# Patient Record
Sex: Female | Born: 1980
Health system: Southern US, Community
[De-identification: ages and names within clinical notes are randomized; demographics above are authoritative.]

## PROBLEM LIST (undated history)

## (undated) ENCOUNTER — Emergency Department (HOSPITAL_COMMUNITY): Admission: EM | Payer: Medicare Other | Source: Home / Self Care

## (undated) DIAGNOSIS — Z87898 Personal history of other specified conditions: Secondary | ICD-10-CM

## (undated) DIAGNOSIS — F1911 Other psychoactive substance abuse, in remission: Secondary | ICD-10-CM

## (undated) DIAGNOSIS — R739 Hyperglycemia, unspecified: Secondary | ICD-10-CM

## (undated) DIAGNOSIS — Z8639 Personal history of other endocrine, nutritional and metabolic disease: Secondary | ICD-10-CM

## (undated) DIAGNOSIS — J449 Chronic obstructive pulmonary disease, unspecified: Secondary | ICD-10-CM

## (undated) DIAGNOSIS — R569 Unspecified convulsions: Secondary | ICD-10-CM

## (undated) DIAGNOSIS — F22 Delusional disorders: Secondary | ICD-10-CM

## (undated) DIAGNOSIS — M65311 Trigger thumb, right thumb: Secondary | ICD-10-CM

## (undated) DIAGNOSIS — F17200 Nicotine dependence, unspecified, uncomplicated: Secondary | ICD-10-CM

## (undated) DIAGNOSIS — G43909 Migraine, unspecified, not intractable, without status migrainosus: Secondary | ICD-10-CM

## (undated) DIAGNOSIS — G629 Polyneuropathy, unspecified: Secondary | ICD-10-CM

## (undated) DIAGNOSIS — G500 Trigeminal neuralgia: Secondary | ICD-10-CM

## (undated) DIAGNOSIS — B20 Human immunodeficiency virus [HIV] disease: Secondary | ICD-10-CM

## (undated) DIAGNOSIS — E0590 Thyrotoxicosis, unspecified without thyrotoxic crisis or storm: Secondary | ICD-10-CM

## (undated) DIAGNOSIS — A310 Pulmonary mycobacterial infection: Secondary | ICD-10-CM

## (undated) DIAGNOSIS — F329 Major depressive disorder, single episode, unspecified: Secondary | ICD-10-CM

## (undated) DIAGNOSIS — F419 Anxiety disorder, unspecified: Secondary | ICD-10-CM

## (undated) HISTORY — PX: CHOLECYSTECTOMY: SHX55

## (undated) HISTORY — PX: FRACTURE SURGERY: SHX138

## (undated) HISTORY — DX: Delusional disorders: F22

## (undated) HISTORY — PX: CARPAL TUNNEL RELEASE: SHX101

## (undated) HISTORY — PX: URETERAL STENT PLACEMENT: SHX822

## (undated) HISTORY — PX: WRIST SURGERY: SHX841

## (undated) HISTORY — PX: CLOSED MANIPULATION SHOULDER: SUR205

## (undated) HISTORY — DX: Thyrotoxicosis, unspecified without thyrotoxic crisis or storm: E05.90

## (undated) HISTORY — DX: Human immunodeficiency virus (HIV) disease: B20

## (undated) HISTORY — DX: Major depressive disorder, single episode, unspecified: F32.9

## (undated) HISTORY — PX: WISDOM TOOTH EXTRACTION: SHX21

## (undated) HISTORY — PX: CYSTOSCOPY W/ URETERAL STENT REMOVAL: SHX1430

## (undated) HISTORY — PX: HAND SURGERY: SHX662

## (undated) HISTORY — DX: Anxiety disorder, unspecified: F41.9

## (undated) HISTORY — DX: Unspecified convulsions: R56.9

## (undated) HISTORY — PX: URETER SURGERY: SHX823

## (undated) HISTORY — DX: Hyperglycemia, unspecified: R73.9

---
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
## 1999-02-22 ENCOUNTER — Other Ambulatory Visit: Admission: RE | Admit: 1999-02-22 | Discharge: 1999-02-22 | Payer: Self-pay | Admitting: Obstetrics & Gynecology

## 1999-05-31 DIAGNOSIS — F32A Depression, unspecified: Secondary | ICD-10-CM

## 1999-05-31 HISTORY — DX: Depression, unspecified: F32.A

## 1999-09-11 ENCOUNTER — Inpatient Hospital Stay (HOSPITAL_COMMUNITY): Admission: AD | Admit: 1999-09-11 | Discharge: 1999-09-13 | Payer: Self-pay | Admitting: Family Medicine

## 1999-10-14 ENCOUNTER — Encounter: Admission: RE | Admit: 1999-10-14 | Discharge: 1999-10-14 | Payer: Self-pay | Admitting: Orthopedic Surgery

## 1999-10-14 ENCOUNTER — Encounter: Payer: Self-pay | Admitting: Orthopedic Surgery

## 2001-04-06 ENCOUNTER — Emergency Department (HOSPITAL_COMMUNITY): Admission: EM | Admit: 2001-04-06 | Discharge: 2001-04-06 | Payer: Self-pay | Admitting: Emergency Medicine

## 2001-04-06 ENCOUNTER — Encounter: Payer: Self-pay | Admitting: Internal Medicine

## 2001-04-06 ENCOUNTER — Encounter: Payer: Self-pay | Admitting: Emergency Medicine

## 2001-05-30 HISTORY — PX: WRIST SURGERY: SHX841

## 2001-05-30 HISTORY — PX: HAND SURGERY: SHX662

## 2001-06-23 ENCOUNTER — Emergency Department (HOSPITAL_COMMUNITY): Admission: EM | Admit: 2001-06-23 | Discharge: 2001-06-23 | Payer: Self-pay | Admitting: Emergency Medicine

## 2001-07-06 ENCOUNTER — Encounter: Payer: Self-pay | Admitting: Orthopedic Surgery

## 2001-07-06 ENCOUNTER — Ambulatory Visit (HOSPITAL_COMMUNITY): Admission: RE | Admit: 2001-07-06 | Discharge: 2001-07-06 | Payer: Self-pay | Admitting: Orthopedic Surgery

## 2001-08-17 ENCOUNTER — Other Ambulatory Visit: Admission: RE | Admit: 2001-08-17 | Discharge: 2001-08-17 | Payer: Self-pay | Admitting: *Deleted

## 2001-08-21 ENCOUNTER — Encounter: Payer: Self-pay | Admitting: *Deleted

## 2001-08-21 ENCOUNTER — Encounter: Admission: RE | Admit: 2001-08-21 | Discharge: 2001-08-21 | Payer: Self-pay | Admitting: *Deleted

## 2001-11-22 ENCOUNTER — Encounter: Admission: RE | Admit: 2001-11-22 | Discharge: 2002-02-20 | Payer: Self-pay | Admitting: Orthopedic Surgery

## 2002-01-01 ENCOUNTER — Ambulatory Visit (HOSPITAL_BASED_OUTPATIENT_CLINIC_OR_DEPARTMENT_OTHER): Admission: RE | Admit: 2002-01-01 | Discharge: 2002-01-02 | Payer: Self-pay | Admitting: Orthopedic Surgery

## 2002-01-01 HISTORY — PX: SHOULDER ARTHROSCOPY: SHX128

## 2002-01-04 ENCOUNTER — Encounter: Payer: Self-pay | Admitting: Orthopedic Surgery

## 2002-01-04 ENCOUNTER — Inpatient Hospital Stay (HOSPITAL_COMMUNITY): Admission: EM | Admit: 2002-01-04 | Discharge: 2002-01-08 | Payer: Self-pay | Admitting: *Deleted

## 2002-02-21 ENCOUNTER — Encounter: Admission: RE | Admit: 2002-02-21 | Discharge: 2002-03-07 | Payer: Self-pay | Admitting: Orthopedic Surgery

## 2002-03-08 ENCOUNTER — Encounter: Admission: RE | Admit: 2002-03-08 | Discharge: 2002-05-01 | Payer: Self-pay | Admitting: Orthopedic Surgery

## 2002-06-09 ENCOUNTER — Encounter: Payer: Self-pay | Admitting: Emergency Medicine

## 2002-06-09 ENCOUNTER — Emergency Department (HOSPITAL_COMMUNITY): Admission: EM | Admit: 2002-06-09 | Discharge: 2002-06-09 | Payer: Self-pay | Admitting: Emergency Medicine

## 2003-05-31 DIAGNOSIS — G43909 Migraine, unspecified, not intractable, without status migrainosus: Secondary | ICD-10-CM

## 2003-05-31 HISTORY — DX: Migraine, unspecified, not intractable, without status migrainosus: G43.909

## 2005-11-23 ENCOUNTER — Other Ambulatory Visit: Admission: RE | Admit: 2005-11-23 | Discharge: 2005-11-23 | Payer: Self-pay | Admitting: *Deleted

## 2006-03-04 ENCOUNTER — Emergency Department (HOSPITAL_COMMUNITY): Admission: EM | Admit: 2006-03-04 | Discharge: 2006-03-04 | Payer: Self-pay | Admitting: Family Medicine

## 2006-05-30 DIAGNOSIS — F419 Anxiety disorder, unspecified: Secondary | ICD-10-CM

## 2006-05-30 HISTORY — PX: CARPAL TUNNEL RELEASE: SHX101

## 2006-05-30 HISTORY — DX: Anxiety disorder, unspecified: F41.9

## 2006-11-28 ENCOUNTER — Ambulatory Visit: Payer: Self-pay | Admitting: Psychiatry

## 2006-11-28 ENCOUNTER — Inpatient Hospital Stay (HOSPITAL_COMMUNITY): Admission: RE | Admit: 2006-11-28 | Discharge: 2006-12-07 | Payer: Self-pay | Admitting: Psychiatry

## 2006-12-01 ENCOUNTER — Encounter (HOSPITAL_COMMUNITY): Payer: Self-pay | Admitting: Psychiatry

## 2006-12-28 ENCOUNTER — Encounter: Admission: RE | Admit: 2006-12-28 | Discharge: 2007-01-04 | Payer: Self-pay | Admitting: Nurse Practitioner

## 2006-12-30 ENCOUNTER — Inpatient Hospital Stay (HOSPITAL_COMMUNITY): Admission: AD | Admit: 2006-12-30 | Discharge: 2007-01-03 | Payer: Self-pay | Admitting: *Deleted

## 2007-06-26 ENCOUNTER — Other Ambulatory Visit: Admission: RE | Admit: 2007-06-26 | Discharge: 2007-06-26 | Payer: Self-pay | Admitting: Obstetrics & Gynecology

## 2007-09-21 ENCOUNTER — Emergency Department (HOSPITAL_COMMUNITY): Admission: EM | Admit: 2007-09-21 | Discharge: 2007-09-21 | Payer: Self-pay | Admitting: Emergency Medicine

## 2007-12-18 ENCOUNTER — Emergency Department (HOSPITAL_COMMUNITY): Admission: EM | Admit: 2007-12-18 | Discharge: 2007-12-19 | Payer: Self-pay | Admitting: Emergency Medicine

## 2008-01-08 ENCOUNTER — Encounter (INDEPENDENT_AMBULATORY_CARE_PROVIDER_SITE_OTHER): Payer: Self-pay | Admitting: Internal Medicine

## 2008-01-08 ENCOUNTER — Ambulatory Visit: Payer: Self-pay | Admitting: Internal Medicine

## 2008-01-08 ENCOUNTER — Ambulatory Visit: Payer: Self-pay | Admitting: Psychiatry

## 2008-01-08 ENCOUNTER — Inpatient Hospital Stay (HOSPITAL_COMMUNITY): Admission: AD | Admit: 2008-01-08 | Discharge: 2008-01-11 | Payer: Self-pay

## 2008-09-05 ENCOUNTER — Encounter: Admission: RE | Admit: 2008-09-05 | Discharge: 2008-09-05 | Payer: Self-pay

## 2009-09-09 ENCOUNTER — Emergency Department (HOSPITAL_COMMUNITY): Admission: EM | Admit: 2009-09-09 | Discharge: 2009-09-09 | Payer: Self-pay | Admitting: Family Medicine

## 2010-10-12 NOTE — Discharge Summary (Signed)
NAMECHERISSA, HOOK             ACCOUNT NO.:  [id number]   MEDICAL RECORD NO.:  [id number]          PATIENT TYPE:  IPS   LOCATION:  0605                          [facility]ACILITY:  [facility]   PHYSICIAN:  Jasmine Pang, M.D. DATE OF BIRTH:  June 26, 1980   DATE OF ADMISSION:  12/30/2006  DATE OF DISCHARGE:  01/03/2007                               DISCHARGE SUMMARY   IDENTIFICATION:  This is a 30 year old married white female who was  admitted on a voluntary basis on December 30, 2006.   HISTORY OF PRESENT ILLNESS:  Lorielle was just recently with [place] November 28, 2006 to December 07, 2006.  The patient was discharged on Pristiq 50 mg a  day, Risperdal 1 mg three times a day and 2 mg at bedtime, Tegretol 200  mg three times a day, Xanax 0.5 mg q.i.d. and Ambien 10 mg p.o. q.h.s.  and Vistaril 50 mg p.o. t.i.d.  She kept her appointment with Donnie Aho on December 12, 2006.  She has talked with her counselor a number of  times and states that, after the 15th, she developed nausea and vomiting  for about a week.  She states she was unable to keep her medications  down.  She then began having visual hallucinations and became semi-  catatonic according to her husband.  She resumed her medications again  approximately 2-3 days ago and, although her psychosis has diminished,  she was verbalizing suicidal ideation.  The patient states her husband  could not take her anymore and brought her in to be admitted.  She  apparently has had thoughts of shooting herself.  She has means and  access through her prescribed medications as well as a gun in the home.  The husband says he has hidden it.  However, she stated she could find  it.  As already stated, the patient was admitted here November 28, 2006 to  December 07, 2006.  At that time, she was admitted for suicidal ideation but  did not have a specific plan.  At discharge, Dr. Dub Mikes felt that her  diagnosis was actually PTSD/psychosis.   SOCIAL HISTORY:  The patient is  married for about five years.  She has  some college.  She has two children, ages 2 and 35, both are boys.  She  is a Futures trader.  She denies any legal problems.  Her husband is  supportive and she denies any negative issues in her home life.   FAMILY HISTORY:  Her first cousin completed suicide.  She also has a  cousin who is currently in prison who has been treated for homicidal  ideation.  She has a biologic brother with unspecified mental illness.   MEDICAL HISTORY:  She reports no medical problems, although she is  obese.  She does have a history for having right rotator cuff surgery.   MEDICATIONS:  She is on the medications as listed above.   ALLERGIES:  She has no known drug allergies.   PHYSICAL EXAMINATION:  The patient is a well-developed, well-nourished,  white female who appears her stated age.  She is  obese.  There are no  acute medical or physical problems.   LABORATORY DATA:  Admission laboratories were done in the ED prior to  admission and evaluated by the ED physician.  See chart.   HOSPITAL COURSE:  Upon admission, the patient was put on Xanax 0.5 mg  p.o. q.6h. p.r.n. anxiety and Seroquel 50 mg p.o. q.6h. p.r.n.  hallucinations.  She was started on Tegretol 200 mg p.o. t.i.d.,  Risperdal M-Tab 1 mg p.o. b.i.d. and Risperdal M-Tab 1 mg p.o. q.h.s.,  Cogentin 1 mg p.o. b.i.d.  Seroquel was discontinued.  Instead, she was  placed on Vistaril 50 mg p.o. t.i.d. p.r.n. anxiety.  On January 01, 2007,  Risperdal was increased to 1 mg p.o. t.i.d. and 2 mg p.o. q.h.s.  She  states that this dose was what she was on before and has worked the  best.  She was also placed on citalopram 20 mg p.o. q.d. and ibuprofen  600 mg p.o. q.6h. p.r.n. headache x24 hours.  The patient tolerated her  medications well with no significant side effects.  She was cooperative  on the unit and able to participate appropriately in unit therapeutic  groups and activities.  She was somewhat sedate  but was actively  involved.  Her sleep had been poor prior to admission.  However, her  appetite was within normal limits.  Her depression was decreasing as the  hospitalization progressed.  She initially had suicidal ideation with  thoughts of hanging herself but could contract for safety.  There was no  homicidal ideation.  No auditory or visual hallucinations.  The  Risperdal was increased as indicated above and citalopram was started.  As hospitalization progressed further, the patient's mental status  improved.  She had some depression with anxiety but this was decreasing  each day.  She was very anxious to go home.  On January 03, 2007, the  patient's mental status had improved markedly from admission status.  She was friendly and cooperative with good eye contact.  Speech was  normal rate and flow.  Psychomotor activity was within normal limits.  Mood euthymic.  Affect wide range.  There was no suicidal or homicidal  ideation.  No thoughts of self-injurious behavior.  No auditory or  visual hallucinations.  No paranoia or delusions.  Thoughts were logical  and goal directed.  Thought content no predominant theme.  Cognitive  exam was grossly within normal limits and back to baseline.  The  patient's husband was planning to pick her up today and felt ready to  take the patient home.   DISCHARGE DIAGNOSES:  AXIS I:  Major depressive disorder, recurrent,  severe with psychotic features.  AXIS II:  Rule out personality disorder not otherwise specified.  AXIS III:  Obesity, status post right rotator cuff surgery.  AXIS IV:  Moderate (burden of psychiatric illness).  AXIS V:  GAF 50 at discharge; GAF 35 at admission; GAF highest past year  6.   ACTIVITY/DIET:  There were no specific activity level or dietary  restrictions.   POST-HOSPITAL CARE PLANS:  The patient will be seen by Donnie Aho at  [facility] on January 10, 2007 at 5:20 p.m. and Isaiah Blakes,  counselor, on January 05, 2007 at 9 a.m.   DISCHARGE MEDICATIONS:  1. Tegretol 200 mg p.o. t.i.d.  2. Cogentin 1 mg p.o. b.i.d.  3. Risperdal M-Tab 1 mg t.i.d. and 2 mg at bedtime.  4. Celexa 20 mg daily.  5. Alprazolam 0.5 mg q.6h. p.r.n. anxiety.  6. Ambien 10 mg at bedtime p.r.n. insomnia.  7. Vistaril 50 mg t.i.d. p.r.n. anxiety.  8. Cogentin 1 mg q.8h. p.r.n. EPS symptoms.      Jasmine Pang, M.D.  Electronically Signed     BHS/MEDQ  D:  01/03/2007  T:  01/03/2007  Job:  [id number]

## 2010-10-12 NOTE — Procedures (Signed)
EEG NUMBER:  [id number].   HISTORY:  This is a 30 year old patient with a history of depression and  anxiety.  The patient is being evaluated for problems with syncope.  The  patient will be evaluated for possible seizures.  This is a portable EEG  recording.  No skull defects were noted.   EEG CLASSIFICATION:  Dysrhythmia grade 1 generalized.   DESCRIPTION OF RECORDING:  The background rhythm of this recording  consists of fairly well-modulated medium amplitude beta frequency rhythm  of 7 Hz.  As the record progresses, the patient appears to be drowsy at  times during recording, but never clearly enters stage II sleep.  Background activities, however, remain slightly slow throughout the  recording.  Photic stimulation is performed resulting in a bilateral and  symmetric photic drive response.  Hyperventilation is performed  resulting in a minimal build up of the background activity without  significant slowing seen.  At no time during the recording did there  appear to be evidence of spikes, spike wave discharges or evidence of  focal slowing.  EKG monitor shows no evidence of cardiac rhythm  abnormalities with a heart rate of 90.   IMPRESSION:  This is a minimally abnormal EEG recording due to  generalized mild theta frequency slowing.  This is nonspecific and may  be noted in any process that results and a mild toxic or metabolic  encephalopathy, and it can be seen in any dementing-type illness.  No  evidence of ictal or interictal discharges were seen.      Marlan Palau, M.D.  Electronically Signed     GMW:NUUV  D:  01/08/2008 19:16:59  T:  01/09/2008 08:00:57  Job #:  [id number]
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
## 2010-10-12 NOTE — H&P (Signed)
[name], [name] NO.:  [id number]   MEDICAL RECORD NO.:  [id number]          PATIENT TYPE:  EMS   LOCATION:  ED                           [facility]   PHYSICIAN:  Eduard Clos, MDDATE OF BIRTH:  [birth year]   DATE OF ADMISSION:  01/07/2008  DATE OF DISCHARGE:                              HISTORY & PHYSICAL   History obtained from ER physician and patient's husband.   CHIEF COMPLAINT:  Loss of consciousness.   HISTORY OF PRESENT ILLNESS:  A 30 year old female with history of major  depression and psychosis, was brought into the ER by the patient's  husband after the patient was found to have frequent spells of loss of  consciousness, wherein the patient suddenly lost consciousness.  While  having her lunch or doing any particular activities, the patient was  noticed to have these symptoms over the last 2 weeks.  Over the last 3-4  days it is becoming more frequent and she is having 5-6 attacks daily.  The patient's husband states that she was not getting sleep and her  psychiatrist had suggested her to take double the dose of her Xanax and  Vistaril, which has per the patient's husband, the patient has been  taking all those doses at one time at bedtime.  The patient's husband  does not recall the patient abusing drugs recently, though her urine  drug screen does show positive for marijuana.  The patient, after each  episode, has had no weakness of limbs, did not complain of chest pain  prior to post of the incident.  Denies any shortness of breath.  Denies  any abdominal pain, nausea vomiting, diarrhea, fever, chills.   PAST MEDICAL HISTORY:  Depression, psychosis.   PAST SURGICAL HISTORY:  None.   MEDICATION PRIOR TO ADMISSION:  1. Geodon 100 mg twice a day.  2. Vistaril as needed.  3. Lithium carbonate 600 mg p.o. twice daily.  4. Xanax 0.5 mg p.o. 4 times daily.  5. Celexa 80 mg daily.  6. Ambien 10 mg at bedtime.  7. Temazepam as  needed.   ALLERGIES:  NO KNOWN DRUG ALLERGIES.   FAMILY HISTORY:  Noncontributory.   SOCIAL HISTORY:  The patient smokes cigarettes.  Denies any alcohol  abuse.  Past history of illegal drug abuse.   REVIEW OF SYSTEMS:  As presented in history of present illness.  Nothing  else significant.   PHYSICAL EXAMINATION:  GENERAL:  The patient is examined at bedside.  Moderate, acute distress.  VITAL SIGNS:  Blood pressure is 128/70.  Pulse 85 per minute.  Temperature 98.5.  Respirations 18 per minute.  O2 sat 98%.  HEENT:  Anicteric, no pallor.  PERRLA positive.  CHEST:  Bilateral air entry present.  No rhonchi, no crepitation.  HEART:  S1 and S2 heard.  ABDOMEN:  Soft, nontender.  Bowel sounds heard.  No guarding or  rigidity.  CNS:  The patient is alert, awake, oriented to time, place, person,  moves both upper extremities.  EXTREMITIES:  Full pulses felt, no edema.   LABORATORY DATA:  CBC, WBC is 12.2,  hematocrit 36.  Basic metabolic  panel, sodium 137, potassium 3.3, chloride 104, carbon dioxide 24,  glucose 128, BUN 5, creatinine 0.86.  Calcium 9.3, magnesium 2.  Pregnancy screen negative.  Acetaminophen level less than 10.  Lithium  less than 2.5.  Salicylates less than 4.  Drug screen is positive for  benzodiazepine.  Positive opiates and marijuana.  Alcohol level less  than 5.   EKG, normal sinus rhythm with QT interval of 456 to 509.  QRS is 82  milliseconds.   ASSESSMENT:  1. Syncope.  2. Normal electrocardiogram with prolonged QT.  3. History of depression and psychosis.  4. Polysubstance abuse.   PLAN:  Admit the patient to telemetry and observe, with the patient on  suicide precautions, seizure precautions.  Get a CT of the head.  Repeat  EKG in a.m. and also obtain an EEG.  Further recommendation as the  patient's condition evolves.  Discussed with the patient's husband about  the patient's condition.      Eduard Clos, MD  Electronically  Signed     ANK/MEDQ  D:  01/07/2008  T:  01/07/2008  Job:  [id number]
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
## 2010-10-12 NOTE — Op Note (Signed)
[name], [name]             ACCOUNT NO.:  [id number]   MEDICAL RECORD NO.:  [id number]          PATIENT TYPE:  INP   LOCATION:  3715                         FACILITY:  [facility]   PHYSICIAN:  Duke Salvia, MD, FACCDATE OF BIRTH:  Mar 23, 1981   DATE OF PROCEDURE:  01/09/2008  DATE OF DISCHARGE:  01/11/2008                               OPERATIVE REPORT   PREOPERATIVE DIAGNOSIS:  Syncope.   POSTOPERATIVE DIAGNOSIS:  Syncope.   PROCEDURES:  Head-up tilt table test.   The patient was equilibrated in the supine position.  She was then  tilted upright at 70 degrees for 15 minutes.  She then received  nitroglycerin sublingually at 0.4 mg and told to remain for 10 minutes.  There was no significant change in her rate or blood pressure.   IMPRESSION:  Negative tilt table test.   RECOMMENDATIONS:  To follow.      Duke Salvia, MD, [facility]  Electronically Signed     SCK/MEDQ  D:  03/27/2008  T:  03/28/2008  Job:  [id number]

## 2010-10-12 NOTE — H&P (Signed)
NAMEJENNAFER, [name] NO.:  [id number]   MEDICAL RECORD NO.:  [id number]          PATIENT TYPE:  IPS   LOCATION:                                [facility]   PHYSICIAN:  Jasmine Pang, M.D. DATE OF BIRTH:  05/03/1981   DATE OF ADMISSION:  12/30/2006  DATE OF DISCHARGE:                       PSYCHIATRIC ADMISSION ASSESSMENT   This is a voluntary admission to the services of Dr. Milford Cage.   IDENTIFYING INFORMATION:  This is a 30 year old married white female.  [name] was just recently with [place] July 1st to July 10th.  She was  discharged on Prestiq 50 mg a day, Risperdal 1 mg 3 times a day and 2 at  bedtime, Tegretol 200 mg 1 three times a day, Xanax 0.5 mg q.i.d.,  Ambien 10 mg at h.s., and Vistaril 50 t.i.d.  She did keep her postop  visit with Otilio Miu on July 15.  Has talked to her counselor a  number of times, and states that after the 15th she developed nausea and  vomiting for about a week.  She states she was unable to keep her meds  down.  She then began having visual hallucinations, and became semi-  catatonic according to her husband.  She resumed her meds again  approximately 2 or 3 days ago, and although her psychosis has  diminished, she was verbalizing suicidal ideation.  The patient states  her husband could not take her anymore, and brought her in here to be  admitted.  She apparently has been having thoughts of shooting herself.  She has means and access through her prescribed medications, as well as  a gun in the home.  The husband says he has hidden it, however, she  could find it.   PAST PSYCHIATRIC HISTORY:  As already stated, she was admitted here July  1st to July 10th.  At that time, she was admitted also for suicidal  ideation, but at that time she did not have a specific plan.  At  discharge, Dr. Dub Mikes felt that her diagnosis was actually PTSD/psychosis.   SOCIAL HISTORY:  She has been married for about 5 years.  She has  had  some college.  She has 2 children, ages 73 and 66, both are boys.  She is  a Futures trader.  She denies any legal issues.  Her husband is supportive,  and she denies any issues with her home life.   FAMILY HISTORY:  Her first cousin completed suicide.  She also has a  cousin who is currently in prison who has also been treated for  homicidal ideation, and a biological brother with unspecified mental  illness.   MEDICAL HISTORY:  She reports no medical problems, although she is  obese.  She does have a history for having had a right rotator cuff  surgery.   CURRENT MEDICATIONS:  As they have already been stated, but she was  discharged on:  1. Prestiq 50 mg p.o. q. day.  2. Risperdal 1 mg t.i.d. and 2 at bedtime.  3. Tegretol 200 mg t.i.d.  4. Xanax 0.5 mg q.i.d. p.r.n.  5. Ambien 10 mg at h.s.  6. Vistaril 50 mg p.o. t.i.d.   DRUG ALLERGIES:  No known drug allergies.   POSITIVE PHYSICAL FINDINGS:  She is a well-developed, well-nourished  white female who appears her stated age.  She is obese.  VITAL SIGNS:  On admission show that she is 72 inches tall.  She weighs  207.5 pounds.  Temperature 97.3, blood pressure 140/88, pulse 82,  respirations 18.  She has minor scars on her right shoulder from her rotator cuff surgery,  and a tattoo at the base of her spine.   REVIEW OF SYSTEMS:  Unremarkable.  Specifically, she is no longer having  nausea and vomiting, although she does request Cogentin for muscle  stiffness.   MENTAL STATUS EXAM:  Today she is alert and oriented x3.  She is  appropriately, albeit, casually dressed and groomed.  She is adequately  nourished.  Her speech is a normal rate, rhythm and tone.  Her mood is  depressed.  Her affect is flat.  Thought processes, she is denying  hallucinations at this time, although she still maintains that she has  the suicidal ideation.  Judgment and insight are good.  Concentration  and memory are good.  Intelligence is at least  average.  She is not  actively suicidal at this point in time, but she is not safe.  She was  never homicidal.  She denies auditory/visual hallucinations at this  time.   DIAGNOSES:  AXIS I:  Major depressive disorder, recurrent, severe with  psychotic features.  AXIS II:  Deferred.  AXIS III:  None.  AXIS IV:  Moderate.  AXIS V:  35.   PLAN:  Admit for safety and stabilization.  The patient was seen in  conjunction with Dr. Geoffery Lyons.  Her meds were reviewed, and he  decided to restart Tegretol 200 mg p.o. t.i.d., Risperdal M-Tab 1 mg  p.o. b.i.d., Risperdal M-Tab 1 mg at h.s., Cogentin 1 mg p.o. b.i.d.  No  admission labs were ordered, and we will recheck at least a UA, and a  CBC, and a CMET.  Estimated length of stay is 4-5 days to get her meds  adjusted.  If the Risperdal turns out to be the appropriate atypical, we  might consider Risperdal Consta to help with medication compliance.      Mickie Leonarda Salon, P.A.-C.      Jasmine Pang, M.D.  Electronically Signed    MD/MEDQ  D:  12/31/2006  T:  01/01/2007  Job:  [id number]
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
## 2010-10-12 NOTE — Op Note (Signed)
[name], [name]             ACCOUNT NO.:  [id number]   MEDICAL RECORD NO.:  [id number]           PATIENT TYPE:   LOCATION:                                 FACILITY:   PHYSICIAN:  Duke Salvia, MD, FACCDATE OF BIRTH:  05-Jul-1980   DATE OF PROCEDURE:  01/09/2008  DATE OF DISCHARGE:                               OPERATIVE REPORT   PROCEDURE:  Tilt table testing.   PREOPERATIVE DIAGNOSES:  Recurrent syncope, question long QT syndrome;  bipolar disorder.   Following obtaining informed consent, the patient was brought to the  Electrophysiology Laboratory and placed on the fluoroscopic table.  After equilibration in the supine position with her blood pressure in  the 129-140 range, she was tilted up to 70 degrees.  There is no  significant change in her heart rate or blood pressure.  After 15  minutes, 400 mcg of sublingual nitroglycerin was sprayed underneath her  tongue.  Blood pressure remained stable in the 130 range and the heart  rate remained stable in the high 80s-90 range.   IMPRESSION:  Negative tilt table test.      Duke Salvia, MD, [facility]  Electronically Signed     SCK/MEDQ  D:  04/26/2008  T:  04/26/2008  Job:  [id number]

## 2010-10-12 NOTE — Consult Note (Signed)
[name], LAKATOS NO.:  [id number]   MEDICAL RECORD NO.:  [id number]          PATIENT TYPE:  INP   LOCATION:  3715                         FACILITY:  [facility]   PHYSICIAN:  Duke Salvia, MD, FACCDATE OF BIRTH:  05/07/1981   DATE OF CONSULTATION:  01/08/2008  DATE OF DISCHARGE:                                 CONSULTATION   Thank you very much for asking [place] to see [name] in the  electrophysiological consultation for recurrent loss of consciousness.   The patient is a 30 year old married mother of two with a past history  of depression and psychosis for which she takes number of psychotropic  medications including Celexa, Geodon, lithium, Xanax, and uses p.r.n.  Vistaril.  She was brought to the hospital by EMS.  The admission notes  refer to syncope.  The EMS comments refer to anxiety.   The patient admits to recurrent episodes of loss of consciousness over  the last 5 years.  They have become increasingly frequent.  They all  occur with a stereotypical prodrome, which she is poorly able to  describe but clearly recognizes and process.  The prodrome was typically  of about 20-30 seconds of duration and the spells were accompanied by  flushing, diaphoresis, and nausea.  She has been described by her  husband is pale.  She has significant residual orthostatic intolerance  for 5-10 minutes following the initial event with upright position be  associated with recurrent falls.   I should note that this history is quite different from the one Dr. Brien Few  got of 2-week history and abrupt onset without warnings.   She also has a history of palpitations where her heart races, although  this taps out at rate of about 110 and as well as her heart flip  flopping.   She has no other known heart disease.   Review of her chart failed to uncover any other significant past medical  history.   Her medications on arrival are noted and her current medications  include  Ativan 0.2 p.r.n.   SOCIAL HISTORY:  As noted previously, she smokes, then she has cut it  back and she has two children.   PHYSICAL EXAMINATION:  GENERAL:  On examination, she is a young  Caucasian female appearing older than her stated age of 55.  VITAL SIGNS:  Her blood pressure is 115/77.  Her pulse is 78.  She was  afebrile.  She was clear and oriented x3.  HEENT:  Exam demonstrated no icterus, no xanthoma.  NECK:  Neck veins were flat.  Carotids were brisk and full bilaterally  without bruits.  MUSCULOSKELETAL:  The back was without kyphosis or scoliosis.  LUNGS:  Clear.  CARDIOVASCULAR:  Heart sounds were regular without murmurs or gallops.  ABDOMEN:  Soft with active bowel sounds without midline pulsation or  hepatomegaly.  EXTREMITIES:  Femoral pulses were 2+.  Distal pulses were intact.  There  was no clubbing, cyanosis, or edema.  NEUROLOGIC:  Exam was grossly normal.  SKIN:  The skin was warm and dry.   Electrocardiograms obtained on  September 07, 2007, demonstrated sinus rhythm  at 75  with intervals of  0.16/0.08/0.46 with a QTC of 0.51.  ECG this  morning after discontinuation of the aforementioned drugs, the QT  interval 440 milliseconds of the QTC of approximately 500-510  milliseconds.   There were some questions whether there is atrial flutter.  I think the  answer to that is no.   I should note that her laboratories on admission had a potassium of 3.3  and magnesium was 2.   IMPRESSION:  1. 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  2. QT prolongation.  3. Normal sinus rhythm.  4. Bipolar disorder/psychosis/depression.  5. History of palpitations.   [name] syncope sounds to be neurocardiogenic.  Clearly, the  context of her QT prolongation, torsade de pointes need to be  considered, although the episodes were quite long and the prodrome quite  long for  that.  Furthermore, while we do not have her drug history for 5  years ago, it is felt per my history, were of some significant duration,  although there has been some question about the history as noted above.   Tilt-table testing might be useful in trying to clarify the mechanism of  the pectoral limb.  It may not identify what trigger is and it certainly  remains possible that an arrhythmia can trigger it.   I have discussed this with Dr. Brien Few, he would like [place] to proceed with  tilt-table testing and we will plan to do that tomorrow.   Thank you for the consultation.      Duke Salvia, MD, [facility]  Electronically Signed     SCK/MEDQ  D:  01/08/2008  T:  01/09/2008  Job:  [id number]   cc:   Behavioral Health
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
## 2010-10-12 NOTE — Discharge Summary (Signed)
[name], [name]             ACCOUNT NO.:  [id number]   MEDICAL RECORD NO.:  [id number]          PATIENT TYPE:  INP   LOCATION:  3715                         FACILITY:  [facility]   PHYSICIAN:  Eduard Clos, MDDATE OF BIRTH:  Jun 04, 1980   DATE OF ADMISSION:  01/08/2008  DATE OF DISCHARGE:  01/10/2008                               DISCHARGE SUMMARY   COURSE IN THE HOSPITAL:  A 30 year old female with known history of  bipolar disorder, psychosis, was on Geodon and lithium was brought into  the ER due to frequent syncopal episodes which lasted a few minutes each  time, had 4-5 episodes each day over a period of 2 weeks.  On admission,  the patient had a CAT scan of the head that did not show any acute  findings.  The patient's lithium levels were not determinable on  admission, and the patient's drug screen was positive for  benzodiazepine, opiates and marijuana.  Alcohol level less than 5.  EKG  showed a normal sinus rhythm with prolonged QT.  The patient was  admitted for further workup on her syncopal episode with prolonged QT.  Neurology consult and electrophysiology/cardiology consult was obtained.  The patient underwent MRI of the brain and MRA of the brain which did  not show any acute findings.  EEG as per neurologist did not show any  specific seizure-like activity.  For electrophysiology, Dr. Graciela Husbands, the  patient underwent tilt table study which was negative.  A 2-D  echocardiogram showed an EF of 55% with normal left ventricular systolic  function and no wall motion abnormality.  Dr. Graciela Husbands felt that the  patient probably had neurocardiogenic syncope and advised that if a  medication like Geodon has to be started, then she should be on a  cardiac event monitor.  At this time, no further workup as patient is  medically stable at this time and no further episodes of syncope during  this stay.  The patient is stable from medical standpoint view to be  transferred to  [facility].  This was explained to the patient's  husband.   FINAL DIAGNOSES:  1. 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  2. Bipolar disorder.  3. Psychosis.  4. Polysubstance abuse.   PLAN:  Further recommendations and medications as per psychiatrist.  The  patient needs to be restarted on Geodon and, in particular, the patient  needs to be on event monitor.  The patient has been advised to not smoke  cigarettes or abuse drugs.      Eduard Clos, MD  Electronically Signed     ANK/MEDQ  D:  01/10/2008  T:  01/10/2008  Job:  [id number]
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
## 2010-10-15 NOTE — Discharge Summary (Signed)
[name], [name] NO.:  [id number]   MEDICAL RECORD NO.:  [id number]                   PATIENT TYPE:   LOCATION:                                       FACILITY:   PHYSICIAN:  Cain Sieve, M.D.             DATE OF BIRTH:   DATE OF ADMISSION:  DATE OF DISCHARGE:                                 DISCHARGE SUMMARY   ADMISSION DIAGNOSES:  Joint pain, right shoulder.   DISCHARGE DIAGNOSES:  Joint pain, right shoulder.   PROCEDURE:  None.   CONSULTATIONS:  1. Case management.  2. Physical therapy.   HISTORY OF PRESENT ILLNESS:  The patient is a 30 year old female well known  to Dr. Francena Hanly. She is status post right shoulder manipulation and  lysis of adhesions on January 01, 2002. She had severe pain postop despite two  interscalene blocks. She was discharged home on January 02, 2002 and reports  that she did well initially and attended PT with good progress. The patient  was in the shower on January 04, 2002 and reported the sudden onset of severe  right shoulder pain and she subsequently came to the [facility] emergency  room and was admitted to Dr. Rennis Chris at that time.   LABORATORY DATA:  No lab data available.   DIAGNOSTIC STUDIES:  No EKG and no x-rays available.   HOSPITAL COURSE:  The patient was admitted to [facility] on January 04, 2002 to Dr. Francena Hanly through the emergency room. At the time her  examination showed diffuse swollen right shoulder with resolving ecchymosis.  No drainage. Neurovascularly intact. No evidence of compartment syndrome.  Impression was probable postop hemorrhage. After manipulation of the right  shoulder, she is admitted for pain control. On January 05, 2002 she was seen  by orthopedics. The patient complained of still having lots of pain. She was  ordered a Tens unit to help with the pain. On January 06, 2002 she was seen  by orthopedics. The patient was complaining of nausea and ongoing  shoulder  pain. Right shoulder had decreased swelling and the compartments were soft.  She still had poor pain control but was doing better than at the time of  admission. Will continue with PT and analgesics. On January 07, 2002, she was  seen by orthopedics. She was doing better. She had decreased swelling. Range  of motion  was about 90 degrees forward flexion and 10 degrees external  rotation. Pain control was better. On January 08, 2002 the patient was doing  better and was ready for discharge.   DISPOSITION:  The patient was discharged to home on January 08, 2002.   DISCHARGE MEDICATIONS:  1. Vicodin ES one to two po every four to six hours as needed pain.  2. Valium 5 mg one po every eight hours as needed spasm.  3. Phenergan 25 mg one po every four to six hours  as needed nausea.   DIET:  As tolerated.   ACTIVITY:  Range of motion  was tolerated to right shoulder.   FOLLOW UP:  The patient was to follow-up with Dr. Rennis Chris on Friday, January 11, 2002. She is to call for an appointment.   CONDITION ON DISCHARGE:  Stable and improved.     Clarene Reamer, PA-C                      Cain Sieve, M.D.    SW/MEDQ  D:  01/13/2002  T:  01/14/2002  Job:  [id number]   cc:   Vania Rea. Rennis Chris, M.D.  [address]  [place]  [place] [address]  Fax: [phone]

## 2011-02-22 LAB — DIFFERENTIAL
Basophils Relative: 0
Lymphs Abs: 1.4
Monocytes Absolute: 0.5
Monocytes Relative: 5
Neutro Abs: 8.5 — ABNORMAL HIGH

## 2011-02-22 LAB — URINALYSIS, ROUTINE W REFLEX MICROSCOPIC
Glucose, UA: NEGATIVE
Hgb urine dipstick: NEGATIVE
Protein, ur: NEGATIVE
Specific Gravity, Urine: 1.015
pH: 8

## 2011-02-22 LAB — BASIC METABOLIC PANEL
CO2: 27
Calcium: 9.6
Chloride: 102
GFR calc Af Amer: >60
Sodium: 139

## 2011-02-22 LAB — CBC
Hemoglobin: 13.1
MCHC: 34.5
RBC: 4.17
WBC: 10.5

## 2011-02-25 LAB — CBC
HCT: 37.3
HCT: 32.7 — ABNORMAL LOW
Hemoglobin: 12.7
Hemoglobin: 11 — ABNORMAL LOW
MCHC: 33.7
MCV: 92.4
Platelets: 286
RBC: 4.04
RDW: 12.7
RDW: 12.5
WBC: 11.5 — ABNORMAL HIGH

## 2011-02-25 LAB — LITHIUM LEVEL
Lithium Lvl: 0.99
Lithium Lvl: <0.25 — ABNORMAL LOW

## 2011-02-25 LAB — POCT I-STAT, CHEM 8
Glucose, Bld: 124 — ABNORMAL HIGH
HCT: 36
Hemoglobin: 12.2
Potassium: 3.3 — ABNORMAL LOW
Sodium: 138

## 2011-02-25 LAB — ACETAMINOPHEN LEVEL: Acetaminophen (Tylenol), Serum: <10 — ABNORMAL LOW

## 2011-02-25 LAB — BASIC METABOLIC PANEL
BUN: 5 — ABNORMAL LOW
Calcium: 9.4
Calcium: 9.3
Creatinine, Ser: 0.78
GFR calc Af Amer: >60
GFR calc non Af Amer: >60
GFR calc non Af Amer: >60
Glucose, Bld: 128 — ABNORMAL HIGH
Sodium: 139
Sodium: 137

## 2011-02-25 LAB — LIPID PANEL
HDL: 25 — ABNORMAL LOW
Total CHOL/HDL Ratio: 7.4

## 2011-02-25 LAB — COMPREHENSIVE METABOLIC PANEL
AST: 26
Albumin: 3.7
Albumin: 3.3 — ABNORMAL LOW
Alkaline Phosphatase: 69
Alkaline Phosphatase: 52
BUN: 6
BUN: 4 — ABNORMAL LOW
CO2: 23
Chloride: 105
Chloride: 107
Creatinine, Ser: 0.89
GFR calc Af Amer: >60
GFR calc non Af Amer: >60
Potassium: 4.1
Potassium: 3.8
Total Bilirubin: 0.7
Total Bilirubin: 0.8

## 2011-02-25 LAB — URINE CULTURE: Colony Count: >=100000

## 2011-02-25 LAB — DIFFERENTIAL
Basophils Absolute: 0
Basophils Absolute: 0
Basophils Relative: 0
Basophils Relative: 1
Eosinophils Relative: 3
Eosinophils Relative: 5
Lymphocytes Relative: 25
Monocytes Absolute: 0.5
Monocytes Absolute: 0.5
Neutro Abs: 7.8 — ABNORMAL HIGH

## 2011-02-25 LAB — RAPID URINE DRUG SCREEN, HOSP PERFORMED
Amphetamines: NOT DETECTED
Barbiturates: NOT DETECTED
Opiates: POSITIVE — AB

## 2011-02-25 LAB — ETHANOL: Alcohol, Ethyl (B): <5

## 2011-02-25 LAB — URINALYSIS, ROUTINE W REFLEX MICROSCOPIC
Bilirubin Urine: NEGATIVE
Ketones, ur: NEGATIVE
Nitrite: NEGATIVE
Specific Gravity, Urine: 1.022
Urobilinogen, UA: 0.2
pH: 5.5

## 2011-02-25 LAB — HEMOGLOBIN A1C
Hgb A1c MFr Bld: 5.2
Mean Plasma Glucose: 108

## 2011-02-25 LAB — B-NATRIURETIC PEPTIDE (CONVERTED LAB): Pro B Natriuretic peptide (BNP): <30

## 2011-02-25 LAB — CK TOTAL AND CKMB (NOT AT ARMC): Relative Index: 0.7

## 2011-02-25 LAB — SALICYLATE LEVEL: Salicylate Lvl: <4

## 2011-02-25 LAB — MAGNESIUM: Magnesium: 2

## 2011-02-25 LAB — POCT PREGNANCY, URINE: Preg Test, Ur: NEGATIVE

## 2011-03-14 LAB — COMPREHENSIVE METABOLIC PANEL
ALT: 24
Alkaline Phosphatase: 65
BUN: 15
CO2: 28
Calcium: 8.9
GFR calc non Af Amer: >60
Glucose, Bld: 101 — ABNORMAL HIGH
Sodium: 138

## 2011-03-14 LAB — CBC
HCT: 34.5 — ABNORMAL LOW
Hemoglobin: 11.9 — ABNORMAL LOW
MCHC: 34.5
RBC: 4
RDW: 12.8

## 2011-03-14 LAB — URINALYSIS, ROUTINE W REFLEX MICROSCOPIC
Bilirubin Urine: NEGATIVE
Ketones, ur: NEGATIVE
Nitrite: NEGATIVE
Urobilinogen, UA: 0.2

## 2011-03-14 LAB — CARBAMAZEPINE LEVEL, TOTAL: Carbamazepine Lvl: 9.2

## 2011-03-15 LAB — COMPREHENSIVE METABOLIC PANEL
ALT: 29
Albumin: 3.8
Alkaline Phosphatase: 67
Chloride: 105
Potassium: 4.3
Sodium: 141
Total Protein: 7.1

## 2011-03-15 LAB — CBC
Hemoglobin: 12.8
Platelets: 284
RDW: 11.6
WBC: 9.3

## 2011-03-15 LAB — URINALYSIS, MICROSCOPIC ONLY
Bilirubin Urine: NEGATIVE
Leukocytes, UA: NEGATIVE
Nitrite: NEGATIVE
Specific Gravity, Urine: 1.023
Urobilinogen, UA: 0.2

## 2011-03-15 LAB — CARBAMAZEPINE LEVEL, TOTAL: Carbamazepine Lvl: 11.3

## 2011-03-15 LAB — DRUGS OF ABUSE SCREEN W/O ALC, ROUTINE URINE
Amphetamine Screen, Ur: NEGATIVE
Creatinine,U: 186.4
Marijuana Metabolite: POSITIVE — AB
Propoxyphene: NEGATIVE

## 2011-03-15 LAB — PREGNANCY, URINE: Preg Test, Ur: NEGATIVE

## 2011-03-15 LAB — THC (MARIJUANA), URINE, CONFIRMATION: Marijuana, Ur-Confirmation: 82 ng/mL

## 2011-03-15 LAB — BENZODIAZEPINE, QUANTITATIVE, URINE: Oxazepam GC/MS Conf: NEGATIVE

## 2012-05-30 DIAGNOSIS — F1911 Other psychoactive substance abuse, in remission: Secondary | ICD-10-CM

## 2012-05-30 HISTORY — PX: CHOLECYSTECTOMY: SHX55

## 2012-05-30 HISTORY — DX: Other psychoactive substance abuse, in remission: F19.11

## 2013-09-24 ENCOUNTER — Emergency Department (HOSPITAL_COMMUNITY)
Admission: EM | Admit: 2013-09-24 | Discharge: 2013-09-25 | Disposition: A | Discharge status: Home / Self Care | Payer: Medicare Other | Attending: Emergency Medicine | Admitting: Emergency Medicine

## 2013-09-24 ENCOUNTER — Encounter (HOSPITAL_COMMUNITY): Payer: Self-pay | Admitting: Emergency Medicine

## 2013-09-24 DIAGNOSIS — B029 Zoster without complications: Secondary | ICD-10-CM | POA: Insufficient documentation

## 2013-09-24 DIAGNOSIS — Z79899 Other long term (current) drug therapy: Secondary | ICD-10-CM | POA: Insufficient documentation

## 2013-09-24 DIAGNOSIS — R1110 Vomiting, unspecified: Secondary | ICD-10-CM | POA: Insufficient documentation

## 2013-09-24 DIAGNOSIS — R6883 Chills (without fever): Secondary | ICD-10-CM | POA: Insufficient documentation

## 2013-09-24 DIAGNOSIS — R002 Palpitations: Secondary | ICD-10-CM | POA: Insufficient documentation

## 2013-09-24 DIAGNOSIS — F17200 Nicotine dependence, unspecified, uncomplicated: Secondary | ICD-10-CM | POA: Insufficient documentation

## 2013-09-24 DIAGNOSIS — M791 Myalgia, unspecified site: Secondary | ICD-10-CM

## 2013-09-24 DIAGNOSIS — IMO0001 Reserved for inherently not codable concepts without codable children: Secondary | ICD-10-CM | POA: Insufficient documentation

## 2013-09-24 DIAGNOSIS — R21 Rash and other nonspecific skin eruption: Secondary | ICD-10-CM | POA: Insufficient documentation

## 2013-09-24 LAB — CBC WITH DIFFERENTIAL/PLATELET
BASOS ABS: 0 10*3/uL (ref 0.0–0.1)
Basophils Relative: 0 % (ref 0–1)
EOS ABS: 0.3 10*3/uL (ref 0.0–0.7)
EOS PCT: 3 % (ref 0–5)
HCT: 41.6 % (ref 36.0–46.0)
Hemoglobin: 14 g/dL (ref 12.0–15.0)
LYMPHS ABS: 5 10*3/uL — AB (ref 0.7–4.0)
Lymphocytes Relative: 47 % — ABNORMAL HIGH (ref 12–46)
MCH: 30.8 pg (ref 26.0–34.0)
MCHC: 33.7 g/dL (ref 30.0–36.0)
MCV: 91.4 fL (ref 78.0–100.0)
Monocytes Absolute: 0.5 10*3/uL (ref 0.1–1.0)
Monocytes Relative: 5 % (ref 3–12)
NEUTROS PCT: 45 % (ref 43–77)
Neutro Abs: 4.8 10*3/uL (ref 1.7–7.7)
PLATELETS: 228 10*3/uL (ref 150–400)
RBC: 4.55 MIL/uL (ref 3.87–5.11)
RDW: 13 % (ref 11.5–15.5)
WBC: 10.7 10*3/uL — AB (ref 4.0–10.5)

## 2013-09-24 LAB — COMPREHENSIVE METABOLIC PANEL
ALBUMIN: 3.9 g/dL (ref 3.5–5.2)
ALK PHOS: 70 U/L (ref 39–117)
ALT: 13 U/L (ref 0–35)
AST: 13 U/L (ref 0–37)
BUN: 8 mg/dL (ref 6–23)
CALCIUM: 9.2 mg/dL (ref 8.4–10.5)
CO2: 21 mEq/L (ref 19–32)
Chloride: 106 mEq/L (ref 96–112)
Creatinine, Ser: 0.78 mg/dL (ref 0.50–1.10)
GFR calc Af Amer: >90 mL/min (ref >90)
GFR calc non Af Amer: >90 mL/min (ref >90)
GLUCOSE: 135 mg/dL — AB (ref 70–99)
POTASSIUM: 3.6 meq/L — AB (ref 3.7–5.3)
SODIUM: 143 meq/L (ref 137–147)
TOTAL PROTEIN: 7.2 g/dL (ref 6.0–8.3)
Total Bilirubin: 0.7 mg/dL (ref 0.3–1.2)

## 2013-09-24 NOTE — ED Notes (Signed)
Pt. reports left side torso ache with rash at left lateral abdomen / emesis and diarrhea for several days .

## 2013-09-25 LAB — URINALYSIS, ROUTINE W REFLEX MICROSCOPIC
BILIRUBIN URINE: NEGATIVE
GLUCOSE, UA: NEGATIVE mg/dL
HGB URINE DIPSTICK: NEGATIVE
KETONES UR: 15 mg/dL — AB
Leukocytes, UA: NEGATIVE
NITRITE: NEGATIVE
PH: 6 (ref 5.0–8.0)
Protein, ur: NEGATIVE mg/dL
SPECIFIC GRAVITY, URINE: 1.026 (ref 1.005–1.030)
Urobilinogen, UA: 1 mg/dL (ref 0.0–1.0)

## 2013-09-25 LAB — CK: CK TOTAL: 70 U/L (ref 7–177)

## 2013-09-25 LAB — POC URINE PREG, ED: Preg Test, Ur: NEGATIVE

## 2013-09-25 MED ORDER — OXYCODONE-ACETAMINOPHEN 5-325 MG PO TABS: 2.0000 | ORAL_TABLET | ORAL | Status: DC | PRN

## 2013-09-25 MED ORDER — OXYCODONE-ACETAMINOPHEN 5-325 MG PO TABS
2.0000 | ORAL_TABLET | Freq: Once | ORAL | Status: AC
  Administered 2013-09-25: 2 via ORAL
  Filled 2013-09-25: qty 2

## 2013-09-25 NOTE — ED Notes (Signed)
Pt.stated that she is worried about spreading chicken pox.

## 2013-09-25 NOTE — ED Provider Notes (Signed)
CSN: [id number]     Arrival date & time 09/24/13  2120 History   First MD Initiated Contact with Patient 09/25/13 0010     Chief Complaint  Patient presents with  . Generalized Body Aches      The history is provided by the patient.   Patient presents for multiple complaints  She reports for past week, she has had bodyaches, vomiting, diarrhea and rash to left abdomen that is very painful.  Her course is worsening.  Rest improves her symptoms.    She also reports recent palpitations for several months No active CP/SOB No fever No cough is reported    PMH - none Past Surgical History  Procedure Laterality Date  . Cholecystectomy     No family history on file. History  Substance Use Topics  . Smoking status: Current Every Day Smoker  . Smokeless tobacco: Not on file  . Alcohol Use: Yes   OB History   Grav Para Term Preterm Abortions TAB SAB Ect Mult Living                 Review of Systems  Constitutional: Positive for chills.  Respiratory: Negative for cough and shortness of breath.   Cardiovascular: Positive for palpitations. Negative for chest pain.  Gastrointestinal: Positive for vomiting.  Skin: Positive for rash.  Neurological: Negative for syncope.  All other systems reviewed and are negative.     Allergies  Review of patient's allergies indicates no known allergies.  Home Medications   Prior to Admission medications   Not on File   BP 97/62  Pulse 86  Temp(Src) 97.7 F (36.5 C) (Oral)  Resp 20  Wt 168 lb 3 oz (76.289 kg)  SpO2 98%  LMP 08/26/2013 Physical Exam CONSTITUTIONAL: Well developed/well nourished HEAD: Normocephalic/atraumatic EYES: EOMI/PERRL ENMT: Mucous membranes moist NECK: supple no meningeal signs SPINE:entire spine nontender CV: S1/S2 noted, no murmurs/rubs/gallops noted LUNGS: Lungs are clear to auscultation bilaterally, no apparent distress ABDOMEN: soft, nontender, no rebound or guarding GU:no cva tenderness NEURO:  Pt is awake/alert, moves all extremitiesx4 EXTREMITIES: pulses normal, full ROM SKIN: warm, color normal. Linear rash to left flank that is c/w herpes zoster.  There is no drainage noted.  It is tender to palpation PSYCH: no abnormalities of mood noted  ED Course  Procedures   12:54 AM Suspect herpes zoster as cause of rash/pain but given symptoms and duration, will not start antivirals Pt is otherwise well appearing, no distress Labs currently pending 1:44 AM Pt well appearing,labs reassuring, stable for d/c home Labs Review Labs Reviewed  CBC WITH DIFFERENTIAL - Abnormal; Notable for the following:    WBC 10.7 (*)    Lymphocytes Relative 47 (*)    Lymphs Abs 5.0 (*)    All other components within normal limits  COMPREHENSIVE METABOLIC PANEL - Abnormal; Notable for the following:    Potassium 3.6 (*)    Glucose, Bld 135 (*)    All other components within normal limits  URINALYSIS, ROUTINE W REFLEX MICROSCOPIC  CK  POC URINE PREG, ED     EKG Interpretation   Date/Time:  Wednesday September 25 2013 00:49:13 EDT Ventricular Rate:  68 PR Interval:  162 QRS Duration: 87 QT Interval:  433 QTC Calculation: 460 R Axis:   78 Text Interpretation:  Sinus rhythm Baseline wander in lead(s) V2 V3 V4 V5  artifact limits interpretation Confirmed by Bebe ShaggyWICKLINE  MD, Kimyah Frein ([id number])  on 09/25/2013 12:51:53 AM      MDM  Final diagnoses:  Myalgia  Shingles    Nursing notes including past medical history and social history reviewed and considered in documentation Labs/vital reviewed and considered     Joya Gaskinsonald W Dodi Leu, MD 09/25/13 [phone]

## 2013-10-05 ENCOUNTER — Encounter (HOSPITAL_COMMUNITY): Payer: Self-pay | Admitting: Emergency Medicine

## 2013-10-05 ENCOUNTER — Emergency Department (HOSPITAL_COMMUNITY)
Admission: EM | Admit: 2013-10-05 | Discharge: 2013-10-05 | Disposition: A | Discharge status: Home / Self Care | Payer: Medicare Other | Attending: Emergency Medicine | Admitting: Emergency Medicine

## 2013-10-05 DIAGNOSIS — R21 Rash and other nonspecific skin eruption: Secondary | ICD-10-CM

## 2013-10-05 DIAGNOSIS — R112 Nausea with vomiting, unspecified: Secondary | ICD-10-CM | POA: Insufficient documentation

## 2013-10-05 DIAGNOSIS — B029 Zoster without complications: Secondary | ICD-10-CM

## 2013-10-05 DIAGNOSIS — F17200 Nicotine dependence, unspecified, uncomplicated: Secondary | ICD-10-CM | POA: Insufficient documentation

## 2013-10-05 DIAGNOSIS — R5381 Other malaise: Secondary | ICD-10-CM | POA: Insufficient documentation

## 2013-10-05 DIAGNOSIS — R5383 Other fatigue: Secondary | ICD-10-CM

## 2013-10-05 LAB — COMPREHENSIVE METABOLIC PANEL
ALT: 18 U/L (ref 0–35)
AST: 20 U/L (ref 0–37)
Albumin: 3.8 g/dL (ref 3.5–5.2)
Alkaline Phosphatase: 70 U/L (ref 39–117)
BUN: 10 mg/dL (ref 6–23)
CO2: 24 meq/L (ref 19–32)
Calcium: 9.2 mg/dL (ref 8.4–10.5)
Chloride: 107 mEq/L (ref 96–112)
Creatinine, Ser: 0.86 mg/dL (ref 0.50–1.10)
GFR calc Af Amer: >90 mL/min (ref >90)
GFR calc non Af Amer: 87 mL/min — ABNORMAL LOW (ref >90)
Glucose, Bld: 102 mg/dL — ABNORMAL HIGH (ref 70–99)
POTASSIUM: 4.1 meq/L (ref 3.7–5.3)
SODIUM: 143 meq/L (ref 137–147)
TOTAL PROTEIN: 6.8 g/dL (ref 6.0–8.3)
Total Bilirubin: 0.2 mg/dL — ABNORMAL LOW (ref 0.3–1.2)

## 2013-10-05 LAB — PROTIME-INR
INR: 1.04 (ref 0.00–1.49)
Prothrombin Time: 13.4 seconds (ref 11.6–15.2)

## 2013-10-05 LAB — CBC
HEMATOCRIT: 39.4 % (ref 36.0–46.0)
HEMOGLOBIN: 13 g/dL (ref 12.0–15.0)
MCH: 30.7 pg (ref 26.0–34.0)
MCHC: 33 g/dL (ref 30.0–36.0)
MCV: 92.9 fL (ref 78.0–100.0)
Platelets: 224 10*3/uL (ref 150–400)
RBC: 4.24 MIL/uL (ref 3.87–5.11)
RDW: 12.9 % (ref 11.5–15.5)
WBC: 8.4 10*3/uL (ref 4.0–10.5)

## 2013-10-05 MED ORDER — DOXYCYCLINE HYCLATE 100 MG PO CAPS: 100.0000 mg | ORAL_CAPSULE | Freq: Two times a day (BID) | ORAL | Status: DC

## 2013-10-05 MED ORDER — MORPHINE SULFATE 4 MG/ML IJ SOLN
4.0000 mg | Freq: Once | INTRAMUSCULAR | Status: AC
  Administered 2013-10-05: 4 mg via INTRAMUSCULAR
  Filled 2013-10-05: qty 1

## 2013-10-05 MED ORDER — ONDANSETRON 4 MG PO TBDP: ORAL_TABLET | ORAL | Status: DC

## 2013-10-05 MED ORDER — OXYCODONE-ACETAMINOPHEN 5-325 MG PO TABS: 1.0000 | ORAL_TABLET | Freq: Four times a day (QID) | ORAL | Status: DC | PRN

## 2013-10-05 NOTE — Discharge Instructions (Signed)
Take antibiotic doxycycline twice daily for the next 21 days. Followup with one of the resources below to establish care with a primary care physician. Followup with dermatology. Take percocet for severe pain only. No driving or operating heavy machinery while taking percocet. This medication may cause drowsiness. RESOURCE GUIDE  Chronic Pain Problems: Contact [facility]  [phone] Patients need to be referred by their primary care doctor.  Insufficient Money for Medicine: Contact [employer]:  call "211."   No Primary Care Doctor: - Call [facility]  [phone] - can help you locate a primary care doctor that  accepts your insurance, provides certain services, etc. - Physician Referral Service- [phone]  Agencies that provide inexpensive medical care: - [facility]  [phone] - [facility]  [phone] - [facility]  [phone] - [facility]  [phone] - [facility]  [phone] - [facility]  [phone]  Medicaid-accepting [facility] [place] Providers: - [facility]- [address], [address]  [phone], Mon-Fri 9am-7pm, Sat 9am-1pm - [facility]- [address], [address] [place]  [phone] - [facility]- [address], [address] [place]  [phone] [facility]- [address] [place] [address]  [phone] - Renaye RakersVeita Bland- [address], [address], [phone]  Only accepts [place] patients after they have their name  applied to their card  Self Pay (no insurance) in [place]: - Sickle Cell Patients: Dr Willey BladeEric Dean, [facility]  [address], [phone] - [facility]- [address]  [phone]       Redge Gainer-     [facility]- [address], [address]       -     [facility]- see information above (Speak to [employer]am H if you do not have insurance)       -   [facility] [place]- [address],  [phone]       -  [facility]- [address] [place] [address], [phone]       -  Dr Julio Sickssei-Bonsu-  [address], [address], [place], [phone]       -  [facility] - [address], [phone]       -  [facility] [place]- [address] [place] [address], [phone], also [address], [phone]       -    [facility]- [address], [phone], 1st & 3rd Saturday        every month, 10am-1pm  1) Find a Doctor and Pay Out of Pocket Although you won't have to find out who is covered by your insurance plan, it is a good idea to ask around and get recommendations. You will then need to call the office and see if the doctor you have chosen will accept you as a new patient and what types of options they offer for patients who are self-pay. Some doctors offer discounts or will set up payment plans for their patients who do not have insurance, but you will need to ask so you aren't surprised when you get to your appointment.  2) Contact Your Local Health Department Not all health departments have doctors that can see patients for sick visits, but many do, so it is worth a call to see if yours does. If you don't  know where your local health department is, you can check in your phone book. The CDC also has a tool to help you locate your state's health department, and many state websites also have listings of all of their local health departments.  3) Find a Walk-in Clinic If your illness is not likely to be very severe or complicated, you may want to try a walk in clinic. These are popping up all over the country in pharmacies, drugstores, and shopping centers. They're usually staffed by nurse practitioners or physician assistants that have been trained to treat common illnesses and complaints. They're usually fairly quick and inexpensive. However, if you have serious medical issues or chronic medical problems, these are  probably not your best option  Rash A rash is a change in the color or texture of your skin. There are many different types of rashes. You may have other problems that accompany your rash. CAUSES   Infections.  Allergic reactions. This can include allergies to pets or foods.  Certain medicines.  Exposure to certain chemicals, soaps, or cosmetics.  Heat.  Exposure to poisonous plants.  Tumors, both cancerous and noncancerous. SYMPTOMS   Redness.  Scaly skin.  Itchy skin.  Dry or cracked skin.  Bumps.  Blisters.  Pain. DIAGNOSIS  Your caregiver may do a physical exam to determine what type of rash you have. A skin sample (biopsy) may be taken and examined under a microscope. TREATMENT  Treatment depends on the type of rash you have. Your caregiver may prescribe certain medicines. For serious conditions, you may need to see a skin doctor (dermatologist). HOME CARE INSTRUCTIONS   Avoid the substance that caused your rash.  Do not scratch your rash. This can cause infection.  You may take cool baths to help stop itching.  Only take over-the-counter or prescription medicines as directed by your caregiver.  Keep all follow-up appointments as directed by your caregiver. SEEK IMMEDIATE MEDICAL CARE IF:  You have increasing pain, swelling, or redness.  You have a fever.  You have new or severe symptoms.  You have body aches, diarrhea, or vomiting.  Your rash is not better after 3 days. MAKE SURE YOU:  Understand these instructions.  Will watch your condition.  Will get help right away if you are not doing well or get worse. Document Released: 05/06/2002 Document Revised: 08/08/2011 Document Reviewed: 02/28/2011 [facility] Patient Information 2014 [place] [place].

## 2013-10-05 NOTE — ED Provider Notes (Signed)
CSN: [id number]     Arrival date & time 10/05/13  1717 History   First MD Initiated Contact with Patient 10/05/13 2003     Chief Complaint  Patient presents with  . Rash    ? Shingles     (Consider location/radiation/quality/duration/timing/severity/associated sxs/prior Treatment) HPI Comments: 33 year old female presents to the emergency department with her husband complaining of continued rash x3 weeks with a new area that has popped up earlier today on her left side. Patient was seen in the emergency department on April 28 and diagnosed with shingles, given Percocet for pain. She was not started on antivirals. Rashes on her left flank area but is improving since being seen in the ED. This morning she noticed a large black area on her left lower back that is very painful. No itching. She does not know when this appeared, she did not notice it before today. States for the past 3 weeks she has felt very weak and nauseated. She has had a few episodes of vomiting over the past 3 weeks. Denies recent travel, exposure out in the woods. She cannot recall being bit by anything. No contacts with similar symptoms.  Patient is a 33 y.o. female presenting with rash. The history is provided by the patient and the spouse.  Rash Associated symptoms: nausea and vomiting     History reviewed. No pertinent past medical history. Past Surgical History  Procedure Laterality Date  . Cholecystectomy    . Fracture surgery     No family history on file. History  Substance Use Topics  . Smoking status: Current Every Day Smoker  . Smokeless tobacco: Not on file  . Alcohol Use: Yes   OB History   Grav Para Term Preterm Abortions TAB SAB Ect Mult Living                 Review of Systems  Gastrointestinal: Positive for nausea and vomiting.  Skin: Positive for rash.  Neurological: Positive for weakness.  All other systems reviewed and are negative.     Allergies  Review of patient's allergies  indicates no known allergies.  Home Medications   Prior to Admission medications   Medication Sig Start Date End Date Taking? Authorizing Provider  diphenhydrAMINE (BENADRYL) 25 mg capsule Take 25 mg by mouth daily as needed for allergies.    Yes Historical Provider, MD   BP 143/87  Pulse 63  Temp(Src) 98.8 F (37.1 C) (Oral)  Resp 18  SpO2 98%  LMP 09/26/2013 Physical Exam  Nursing note and vitals reviewed. Constitutional: She is oriented to person, place, and time. She appears well-developed and well-nourished. No distress.  HENT:  Head: Normocephalic and atraumatic.  Mouth/Throat: Oropharynx is clear and moist.  Eyes: Conjunctivae are normal.  Neck: Normal range of motion. Neck supple.  Cardiovascular: Normal rate, regular rhythm and normal heart sounds.   Pulmonary/Chest: Effort normal and breath sounds normal.  Musculoskeletal: Normal range of motion. She exhibits no edema.  Neurological: She is alert and oriented to person, place, and time.  Skin: Skin is warm and dry. Rash noted. She is not diaphoretic.  Multiple tiny vesicular lesions clustered into a 2 cm x 4 cm area on left flank. No open lesions. No surrounding erythema or warmth concerning of infection. 5 mm diameter round, raised, black lesion with few raised spots on left lower back, 5 mm area surrounding black lesion of purple/red, blanchable. Tender.  Psychiatric: She has a normal mood and affect. Her behavior is normal.  ED Course  Procedures (including critical care time) Labs Review Labs Reviewed  COMPREHENSIVE METABOLIC PANEL - Abnormal; Notable for the following:    Glucose, Bld 102 (*)    Total Bilirubin 0.2 (*)    GFR calc non Af Amer 87 (*)    All other components within normal limits  CBC  PROTIME-INR    Imaging Review No results found.   EKG Interpretation None      MDM   Final diagnoses:  Rash  Herpes zoster    Patient presenting with rash that is improving with a new area on  left lower back which is different and painful with associated generalized weakness and nausea. She is well appearing and in no apparent distress. Afebrile, vital signs stable. No known tick exposures or recent travels into the woods. No contacts with similar symptoms. Plan to check labs, discharge home on doxycycline. Followup with dermatology.  10:03 PM Labs without any acute finding. Will discharge patient home with doxycycline and pain medicine. Stable for discharge. Resources for primary care given. Return precautions given. Patient states understanding of treatment care plan and is agreeable.  Case discussed with attending Dr. Hyacinth MeekerMiller who agrees with plan of care.    Trevor MaceRobyn M Albert, PA-C 10/05/13 2203

## 2013-10-05 NOTE — ED Notes (Signed)
Pt seen 04/28 for rash and pt states rash was getting better and now she has a large area on her L side that is very painful.

## 2013-10-06 NOTE — ED Provider Notes (Signed)
Medical screening examination/treatment/procedure(s) were performed by non-physician practitioner and as supervising physician I was immediately available for consultation/collaboration.    Sally RollerBrian D Chereese Cilento, MD 10/06/13 [phone]

## 2013-10-15 ENCOUNTER — Ambulatory Visit
Admission: RE | Admit: 2013-10-15 | Discharge: 2013-10-15 | Disposition: A | Discharge status: Home / Self Care | Payer: Medicare Other | Source: Ambulatory Visit | Attending: Dermatology | Admitting: Dermatology

## 2013-10-15 ENCOUNTER — Other Ambulatory Visit: Payer: Self-pay | Admitting: Dermatology

## 2013-10-15 DIAGNOSIS — M8700 Idiopathic aseptic necrosis of unspecified bone: Secondary | ICD-10-CM

## 2013-10-15 DIAGNOSIS — M25559 Pain in unspecified hip: Secondary | ICD-10-CM

## 2013-10-15 DIAGNOSIS — R102 Pelvic and perineal pain: Secondary | ICD-10-CM

## 2013-10-18 ENCOUNTER — Other Ambulatory Visit: Payer: Self-pay | Admitting: Dermatology

## 2013-10-18 ENCOUNTER — Ambulatory Visit
Admission: RE | Admit: 2013-10-18 | Discharge: 2013-10-18 | Disposition: A | Discharge status: Home / Self Care | Payer: Medicare Other | Source: Ambulatory Visit | Attending: Dermatology | Admitting: Dermatology

## 2013-10-18 DIAGNOSIS — M87059 Idiopathic aseptic necrosis of unspecified femur: Secondary | ICD-10-CM

## 2014-02-13 ENCOUNTER — Encounter (HOSPITAL_COMMUNITY): Payer: Self-pay | Admitting: Emergency Medicine

## 2014-02-13 ENCOUNTER — Emergency Department (HOSPITAL_COMMUNITY): Payer: Medicare Other

## 2014-02-13 ENCOUNTER — Emergency Department (HOSPITAL_COMMUNITY)
Admission: EM | Admit: 2014-02-13 | Discharge: 2014-02-13 | Disposition: A | Discharge status: Home / Self Care | Payer: Medicare Other | Attending: Emergency Medicine | Admitting: Emergency Medicine

## 2014-02-13 DIAGNOSIS — R5381 Other malaise: Secondary | ICD-10-CM | POA: Diagnosis not present

## 2014-02-13 DIAGNOSIS — M25559 Pain in unspecified hip: Secondary | ICD-10-CM | POA: Diagnosis not present

## 2014-02-13 DIAGNOSIS — F17200 Nicotine dependence, unspecified, uncomplicated: Secondary | ICD-10-CM | POA: Insufficient documentation

## 2014-02-13 DIAGNOSIS — R5383 Other fatigue: Secondary | ICD-10-CM

## 2014-02-13 DIAGNOSIS — R29898 Other symptoms and signs involving the musculoskeletal system: Secondary | ICD-10-CM

## 2014-02-13 LAB — COMPREHENSIVE METABOLIC PANEL
ALT: 13 U/L (ref 0–35)
ANION GAP: 16 — AB (ref 5–15)
AST: 14 U/L (ref 0–37)
Albumin: 4.2 g/dL (ref 3.5–5.2)
Alkaline Phosphatase: 74 U/L (ref 39–117)
BUN: 10 mg/dL (ref 6–23)
CO2: 24 meq/L (ref 19–32)
CREATININE: 0.68 mg/dL (ref 0.50–1.10)
Calcium: 10.1 mg/dL (ref 8.4–10.5)
Chloride: 106 mEq/L (ref 96–112)
GLUCOSE: 122 mg/dL — AB (ref 70–99)
Potassium: 4.3 mEq/L (ref 3.7–5.3)
Sodium: 146 mEq/L (ref 137–147)
TOTAL PROTEIN: 7.6 g/dL (ref 6.0–8.3)
Total Bilirubin: 0.5 mg/dL (ref 0.3–1.2)

## 2014-02-13 LAB — CK: CK TOTAL: 35 U/L (ref 7–177)

## 2014-02-13 LAB — CBC WITH DIFFERENTIAL/PLATELET
Basophils Absolute: 0 10*3/uL (ref 0.0–0.1)
Basophils Relative: 0 % (ref 0–1)
EOS ABS: 0 10*3/uL (ref 0.0–0.7)
EOS PCT: 0 % (ref 0–5)
HEMATOCRIT: 45.9 % (ref 36.0–46.0)
Hemoglobin: 15.6 g/dL — ABNORMAL HIGH (ref 12.0–15.0)
LYMPHS ABS: 2 10*3/uL (ref 0.7–4.0)
Lymphocytes Relative: 24 % (ref 12–46)
MCH: 30.9 pg (ref 26.0–34.0)
MCHC: 34 g/dL (ref 30.0–36.0)
MCV: 90.9 fL (ref 78.0–100.0)
MONO ABS: 0.4 10*3/uL (ref 0.1–1.0)
Monocytes Relative: 4 % (ref 3–12)
Neutro Abs: 5.9 10*3/uL (ref 1.7–7.7)
Neutrophils Relative %: 72 % (ref 43–77)
Platelets: 230 10*3/uL (ref 150–400)
RBC: 5.05 MIL/uL (ref 3.87–5.11)
RDW: 12.4 % (ref 11.5–15.5)
WBC: 8.3 10*3/uL (ref 4.0–10.5)

## 2014-02-13 LAB — RAPID URINE DRUG SCREEN, HOSP PERFORMED
Amphetamines: NOT DETECTED
Barbiturates: NOT DETECTED
Benzodiazepines: POSITIVE — AB
COCAINE: NOT DETECTED
OPIATES: POSITIVE — AB
Tetrahydrocannabinol: POSITIVE — AB

## 2014-02-13 LAB — URINALYSIS, ROUTINE W REFLEX MICROSCOPIC
Glucose, UA: NEGATIVE mg/dL
HGB URINE DIPSTICK: NEGATIVE
Ketones, ur: >80 mg/dL — AB
Leukocytes, UA: NEGATIVE
NITRITE: NEGATIVE
PH: 7 (ref 5.0–8.0)
Protein, ur: NEGATIVE mg/dL
Specific Gravity, Urine: 1.025 (ref 1.005–1.030)
Urobilinogen, UA: 1 mg/dL (ref 0.0–1.0)

## 2014-02-13 LAB — I-STAT CG4 LACTIC ACID, ED: Lactic Acid, Venous: 1.41 mmol/L (ref 0.5–2.2)

## 2014-02-13 MED ORDER — KETOROLAC TROMETHAMINE 60 MG/2ML IM SOLN: 60.0000 mg | Freq: Once | INTRAMUSCULAR | Status: DC

## 2014-02-13 MED ORDER — KETOROLAC TROMETHAMINE 30 MG/ML IJ SOLN
30.0000 mg | Freq: Once | INTRAMUSCULAR | Status: AC
  Administered 2014-02-13: 30 mg via INTRAVENOUS
  Filled 2014-02-13: qty 1

## 2014-02-13 MED ORDER — MORPHINE SULFATE 4 MG/ML IJ SOLN
4.0000 mg | Freq: Once | INTRAMUSCULAR | Status: AC
  Administered 2014-02-13: 4 mg via INTRAVENOUS
  Filled 2014-02-13: qty 1

## 2014-02-13 MED ORDER — SODIUM CHLORIDE 0.9 % IV BOLUS (SEPSIS)
1000.0000 mL | Freq: Once | INTRAVENOUS | Status: AC
  Administered 2014-02-13: 1000 mL via INTRAVENOUS

## 2014-02-13 NOTE — Discharge Instructions (Signed)

## 2014-02-13 NOTE — ED Provider Notes (Signed)
33 year old female started getting sick 2 days ago with subjective fever and chills. Today, she complains of severe pain in her hips and legs and weakness in her legs that keeps her from standing. She states she is able to stand sometimes and usually her legs will not support her. She has not urinated today. She also is complaining of a cough and states she is coughing up some blood. She's never had an illness like this before. She also notes that she has been having occipital headaches intermittently for about the last 2 years but these are no worse with this current illness and had been previously. On exam, lungs are clear heart is regular rate rhythm. Abdomen is soft and nontender. Nodes are negative straight leg raise. There is mild weakness of the legs but this may be effort related. Deep tendon reflexes are symmetric. Pelvis and chest x-ray are negative and initial laboratory workup is unremarkable.  I saw and evaluated the patient, reviewed the resident's note and I agree with the findings and plan.    Dione Booze, MD 02/13/14 [phone]

## 2014-02-13 NOTE — ED Notes (Signed)
Did in and out cath on patient dark yellow urine in return pt stated that it burns

## 2014-02-13 NOTE — ED Notes (Addendum)
Pt in c/o lower back pain that radiates down her legs, states the pain is making it hard to walk, also reports some confusion over the last month, states she has been forgetful and can't find things in her house, pt states her legs don't work, she tries to stand up and she fall, pt unable to explain to me if this is from pain or inability to use them. Pt also reports weight loss over the last 6 months.

## 2014-02-13 NOTE — ED Notes (Signed)
Returned from x-ray, family at bedside

## 2014-02-13 NOTE — ED Provider Notes (Signed)
CSN: [id number]     Arrival date & time 02/13/14  1328 History   First MD Initiated Contact with Patient 02/13/14 1502     Chief Complaint  Patient presents with  . Altered Mental Status  . Leg Pain    Patient is a 33 y.o. female presenting with back pain. The history is provided by the patient.  Back Pain Pain location: left hip. Radiates to:  L posterior upper leg and R posterior upper leg Pain severity now: 10/10. Onset quality:  Gradual Duration: 1 day. Timing:  Constant Progression:  Unchanged Chronicity:  Recurrent (happened once before in 5th grade) Context: not falling, not lifting heavy objects, not MVA, not occupational injury, not recent injury and not twisting   Ineffective treatments: tyelnl. Associated symptoms: leg pain, perianal numbness and weakness (LE)   Associated symptoms: no abdominal pain, no bladder incontinence, no bowel incontinence, no dysuria, no fever, no headaches, no numbness and no paresthesias   Associated symptoms comment:  Unable to walk, but sometimes can. Has not voided today but no urgency  Pan positive on review of sx, unable to focus on one complaint. Has different diarrhea that is unable to be qualified among many other complaints that are not logical.   History reviewed. No pertinent past medical history. Past Surgical History  Procedure Laterality Date  . Cholecystectomy    . Fracture surgery     History reviewed. No pertinent family history. History  Substance Use Topics  . Smoking status: Current Every Day Smoker  . Smokeless tobacco: Not on file  . Alcohol Use: Yes   OB History   Grav Para Term Preterm Abortions TAB SAB Ect Mult Living                 Review of Systems  Constitutional: Negative for fever.  Gastrointestinal: Negative for abdominal pain and bowel incontinence.  Genitourinary: Negative for bladder incontinence and dysuria.  Musculoskeletal: Positive for back pain.  Neurological: Positive for weakness (LE).  Negative for numbness, headaches and paresthesias.      Allergies  Review of patient's allergies indicates no known allergies.  Home Medications   Prior to Admission medications   Medication Sig Start Date End Date Taking? Authorizing Provider  acetaminophen (TYLENOL) 500 MG tablet Take 500-1,000 mg by mouth every 6 (six) hours as needed (for pain).   Yes Historical Provider, MD   BP 123/82  Pulse 89  Temp(Src) 98 F (36.7 C) (Oral)  Resp 20  SpO2 100% Physical Exam  Nursing note and vitals reviewed. Constitutional: She is oriented to person, place, and time. She appears well-developed and well-nourished.  Well appearing, comfortable appearing  HENT:  Head: Normocephalic and atraumatic.  Nose: Nose normal.  Eyes: Conjunctivae are normal. Pupils are equal, round, and reactive to light.  Neck: Normal range of motion. Neck supple. No tracheal deviation present.  Cardiovascular: Normal rate, regular rhythm and normal heart sounds.   No murmur heard. Pulmonary/Chest: Effort normal and breath sounds normal. No respiratory distress. She has no rales.  Abdominal: Soft. Bowel sounds are normal. She exhibits no distension and no mass. There is no tenderness. There is no rebound and no guarding.  Neg CVAT, psoas, obturator.   Musculoskeletal: Normal range of motion. She exhibits no edema.  Back: Midline, no obvious deformity.  FROM demonstrated.  No midline tenderness. B/l paraspinous lumbar lower TTP. Negative straight leg raise. Specifically TTP over SI joints and pt says this is the pain she feels  Neurological:  She is alert and oriented to person, place, and time.  Alert and oriented. Strength 5/5 for hip flexion, extension, knee flexion and extension, dorsiflexion and plantar flexion.  Normal bulk and tone.  No sensory deficit to light touch.  Gait observed normal.  Symmetric and equal 2+ patellar DTRs.  Pt is able to contract with excellent strength initially but has poor effort after  initial response  Skin: Skin is warm and dry. No rash noted.  Psychiatric:  Thought content not always logical, tangential    ED Course  Procedures (including critical care time) Labs Review Labs Reviewed  CBC WITH DIFFERENTIAL - Abnormal; Notable for the following:    Hemoglobin 15.6 (*)    All other components within normal limits  COMPREHENSIVE METABOLIC PANEL - Abnormal; Notable for the following:    Glucose, Bld 122 (*)    Anion gap 16 (*)    All other components within normal limits  URINE RAPID DRUG SCREEN (HOSP PERFORMED) - Abnormal; Notable for the following:    Opiates POSITIVE (*)    Benzodiazepines POSITIVE (*)    Tetrahydrocannabinol POSITIVE (*)    All other components within normal limits  URINALYSIS, ROUTINE W REFLEX MICROSCOPIC - Abnormal; Notable for the following:    Color, Urine AMBER (*)    APPearance CLOUDY (*)    Bilirubin Urine SMALL (*)    Ketones, ur >80 (*)    All other components within normal limits  CK  I-STAT CG4 LACTIC ACID, ED  POC URINE PREG, ED    Imaging Review Dg Chest 2 View  02/13/2014   CLINICAL DATA:  Shortness of breath, coughing up blood.  Smoker.  EXAM: CHEST  2 VIEW  COMPARISON:  None.  FINDINGS: The heart size and mediastinal contours are within normal limits. There is no focal infiltrate, pulmonary edema, or pleural effusion. The visualized skeletal structures are unremarkable.  IMPRESSION: No active cardiopulmonary disease.   Electronically Signed   By: Sherian Rein M.D.   On: 02/13/2014 16:27   Dg Pelvis 1-2 Views  02/13/2014   CLINICAL DATA:  Altered mental status.  Pelvic pain.  EXAM: PELVIS - 1-2 VIEW  COMPARISON:  10/15/2013  FINDINGS: There is no evidence of acute fracture or dislocation. SI joints are grossly unremarkable. No lytic or blastic osseous lesion is seen. Hip joint spaces are preserved. No soft tissue abnormality is seen.  IMPRESSION: Negative.   Electronically Signed   By: Sebastian Ache   On: 02/13/2014 16:26      EKG Interpretation None      MDM   Final diagnoses:  Hip pain, unspecified laterality  Weakness of both lower extremities   Pt presents with multiple complaints that are random and not related. Pan positive on review of systems. Most concerning is lower back pain at SI joints.  TTP at [facility], pelvis xray without evidence of sacroilitis. N/v intact.  No neuro deficit. Sattes she is unable to walk but demonstrated normal gait here  No signs of peritonitis or systemic toxicity to suggest surgical emergency.  Tangential. Notes vomiting and diarrhea for past day as well. May be gastroenteritis related. UA negative.  VSS. UDS positive for mutliple substances.  Provided contact information for PCP which pt does not have.  Will need repeat physical exam as outpatient.   Stable for discharge home.    Sofie Rower, MD 02/14/14 [phone]

## 2014-02-13 NOTE — ED Notes (Signed)
Patient transported to X-ray 

## 2014-04-30 ENCOUNTER — Telehealth: Payer: Self-pay | Admitting: Neurology

## 2014-04-30 ENCOUNTER — Ambulatory Visit (INDEPENDENT_AMBULATORY_CARE_PROVIDER_SITE_OTHER): Payer: Self-pay | Admitting: Neurology

## 2014-04-30 DIAGNOSIS — R569 Unspecified convulsions: Secondary | ICD-10-CM

## 2014-04-30 NOTE — Telephone Encounter (Signed)
The patient came to the office for the appointment, but refused to be seen. The patient or the family member with the patient demonstrated inappropriate, agitated behavior.

## 2014-05-01 DIAGNOSIS — R569 Unspecified convulsions: Secondary | ICD-10-CM | POA: Insufficient documentation

## 2014-05-01 HISTORY — DX: Unspecified convulsions: R56.9

## 2014-05-01 NOTE — Progress Notes (Signed)
The patient left the office without being seen today.

## 2014-05-07 ENCOUNTER — Ambulatory Visit: Payer: Medicare Other | Admitting: Neurology

## 2014-05-30 DIAGNOSIS — B20 Human immunodeficiency virus [HIV] disease: Secondary | ICD-10-CM

## 2014-05-30 DIAGNOSIS — Z21 Asymptomatic human immunodeficiency virus [HIV] infection status: Secondary | ICD-10-CM

## 2014-05-30 DIAGNOSIS — G629 Polyneuropathy, unspecified: Secondary | ICD-10-CM

## 2014-05-30 HISTORY — DX: Human immunodeficiency virus (HIV) disease: B20

## 2014-05-30 HISTORY — DX: Polyneuropathy, unspecified: G62.9

## 2014-05-30 HISTORY — DX: Asymptomatic human immunodeficiency virus (hiv) infection status: Z21

## 2014-08-05 ENCOUNTER — Telehealth: Payer: Self-pay

## 2014-08-05 NOTE — Telephone Encounter (Signed)
Patient contacted regarding new intake appointment. Date and time given. Information given regarding documents needed to qualify for financial eligibility.  Tammy K King, RN  

## 2014-08-11 ENCOUNTER — Other Ambulatory Visit (HOSPITAL_COMMUNITY)
Admission: RE | Admit: 2014-08-11 | Discharge: 2014-08-11 | Disposition: A | Discharge status: Home / Self Care | Payer: Medicare Other | Source: Ambulatory Visit | Attending: Infectious Disease | Admitting: Infectious Disease

## 2014-08-11 ENCOUNTER — Ambulatory Visit: Payer: Medicare Other

## 2014-08-11 DIAGNOSIS — B20 Human immunodeficiency virus [HIV] disease: Secondary | ICD-10-CM

## 2014-08-11 DIAGNOSIS — Z113 Encounter for screening for infections with a predominantly sexual mode of transmission: Secondary | ICD-10-CM

## 2014-08-11 DIAGNOSIS — F6381 Intermittent explosive disorder: Secondary | ICD-10-CM

## 2014-08-11 DIAGNOSIS — Z79899 Other long term (current) drug therapy: Secondary | ICD-10-CM

## 2014-08-11 DIAGNOSIS — F1910 Other psychoactive substance abuse, uncomplicated: Secondary | ICD-10-CM

## 2014-08-11 DIAGNOSIS — F32A Depression, unspecified: Secondary | ICD-10-CM

## 2014-08-11 DIAGNOSIS — E785 Hyperlipidemia, unspecified: Secondary | ICD-10-CM

## 2014-08-11 DIAGNOSIS — F1990 Other psychoactive substance use, unspecified, uncomplicated: Secondary | ICD-10-CM

## 2014-08-11 DIAGNOSIS — F259 Schizoaffective disorder, unspecified: Secondary | ICD-10-CM

## 2014-08-11 DIAGNOSIS — F329 Major depressive disorder, single episode, unspecified: Secondary | ICD-10-CM

## 2014-08-11 LAB — COMPLETE METABOLIC PANEL WITH GFR
ALT: 12 U/L (ref 0–35)
AST: 14 U/L (ref 0–37)
Albumin: 4.1 g/dL (ref 3.5–5.2)
Alkaline Phosphatase: 74 U/L (ref 39–117)
BUN: 9 mg/dL (ref 6–23)
CO2: 28 mEq/L (ref 19–32)
Calcium: 9.5 mg/dL (ref 8.4–10.5)
Chloride: 105 mEq/L (ref 96–112)
Creat: 0.75 mg/dL (ref 0.50–1.10)
GFR, Est African American: >89 mL/min
GFR, Est Non African American: >89 mL/min
Glucose, Bld: 86 mg/dL (ref 70–99)
Potassium: 4.5 mEq/L (ref 3.5–5.3)
Sodium: 145 mEq/L (ref 135–145)
Total Bilirubin: 0.3 mg/dL (ref 0.2–1.2)
Total Protein: 7 g/dL (ref 6.0–8.3)

## 2014-08-11 LAB — CBC WITH DIFFERENTIAL/PLATELET
Basophils Absolute: 0.1 10*3/uL (ref 0.0–0.1)
Basophils Relative: 1 % (ref 0–1)
Eosinophils Absolute: 0.3 10*3/uL (ref 0.0–0.7)
Eosinophils Relative: 5 % (ref 0–5)
HCT: 39.5 % (ref 36.0–46.0)
HEMOGLOBIN: 13 g/dL (ref 12.0–15.0)
LYMPHS PCT: 48 % — AB (ref 12–46)
Lymphs Abs: 2.9 10*3/uL (ref 0.7–4.0)
MCH: 29.5 pg (ref 26.0–34.0)
MCHC: 32.9 g/dL (ref 30.0–36.0)
MCV: 89.8 fL (ref 78.0–100.0)
MONO ABS: 0.4 10*3/uL (ref 0.1–1.0)
MPV: 8.6 fL (ref 8.6–12.4)
Monocytes Relative: 7 % (ref 3–12)
Neutro Abs: 2.3 10*3/uL (ref 1.7–7.7)
Neutrophils Relative %: 39 % — ABNORMAL LOW (ref 43–77)
PLATELETS: 198 10*3/uL (ref 150–400)
RBC: 4.4 MIL/uL (ref 3.87–5.11)
RDW: 13.1 % (ref 11.5–15.5)
WBC: 6 10*3/uL (ref 4.0–10.5)

## 2014-08-11 LAB — URINALYSIS
BILIRUBIN URINE: NEGATIVE
Glucose, UA: NEGATIVE mg/dL
Hgb urine dipstick: NEGATIVE
Ketones, ur: NEGATIVE mg/dL
Leukocytes, UA: NEGATIVE
Nitrite: NEGATIVE
PROTEIN: NEGATIVE mg/dL
Specific Gravity, Urine: 1.014 (ref 1.005–1.030)
UROBILINOGEN UA: 0.2 mg/dL (ref 0.0–1.0)
pH: 6 (ref 5.0–8.0)

## 2014-08-11 LAB — LIPID PANEL
Cholesterol: 209 mg/dL — ABNORMAL HIGH (ref 0–200)
HDL: 33 mg/dL — AB (ref >=46)
LDL Cholesterol: 146 mg/dL — ABNORMAL HIGH (ref 0–99)
Total CHOL/HDL Ratio: 6.3 Ratio
Triglycerides: 150 mg/dL — ABNORMAL HIGH (ref <150)
VLDL: 30 mg/dL (ref 0–40)

## 2014-08-11 LAB — HEPATITIS B SURFACE ANTIBODY,QUALITATIVE: Hep B S Ab: NEGATIVE

## 2014-08-11 LAB — HEPATITIS A ANTIBODY, TOTAL: Hep A Total Ab: NONREACTIVE

## 2014-08-11 LAB — HEPATITIS B SURFACE ANTIGEN: Hepatitis B Surface Ag: NEGATIVE

## 2014-08-11 LAB — HEPATITIS C ANTIBODY: HCV Ab: NEGATIVE

## 2014-08-11 LAB — RPR

## 2014-08-11 LAB — HEPATITIS B CORE ANTIBODY, TOTAL: Hep B Core Total Ab: NONREACTIVE

## 2014-08-12 LAB — HIV-1 RNA ULTRAQUANT REFLEX TO GENTYP+
HIV 1 RNA QUANT: 52510 {copies}/mL — AB (ref <20)
HIV-1 RNA QUANT, LOG: 4.72 {Log} — AB (ref <1.30)

## 2014-08-12 LAB — T-HELPER CELL (CD4) - (RCID CLINIC ONLY)
CD4 % Helper T Cell: 27 % — ABNORMAL LOW (ref 33–55)
CD4 T Cell Abs: 780 /uL (ref 400–2700)

## 2014-08-12 LAB — URINE CYTOLOGY ANCILLARY ONLY
Chlamydia: NEGATIVE
Neisseria Gonorrhea: NEGATIVE

## 2014-08-13 LAB — QUANTIFERON TB GOLD ASSAY (BLOOD)
Interferon Gamma Release Assay: NEGATIVE
QUANTIFERON NIL VALUE: 0.05 [IU]/mL
QUANTIFERON TB AG MINUS NIL: 0 [IU]/mL
TB Ag value: 0.05 IU/mL

## 2014-08-16 LAB — HLA B*5701: HLA-B*5701 w/rflx HLA-B High: NEGATIVE

## 2014-08-18 NOTE — Progress Notes (Signed)
Patient is here today with her husband of 11 years and they have not been sexually active for at least one year.  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 ([facility], [place]  She gives history of many years of polysubstance abuse. 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  She gives Mental Health history of depression, anxiety disorder, PTSD,  agoraphobia and Schizoaffective disorder.  Dr Guss Bundehalla is her psychiatrist and Evelena Peatlex Wilson is her counselor and her mental health medications are managed through them.

## 2014-08-20 DIAGNOSIS — F321 Major depressive disorder, single episode, moderate: Secondary | ICD-10-CM | POA: Insufficient documentation

## 2014-08-20 DIAGNOSIS — F39 Unspecified mood [affective] disorder: Secondary | ICD-10-CM | POA: Insufficient documentation

## 2014-08-20 DIAGNOSIS — F6381 Intermittent explosive disorder: Secondary | ICD-10-CM | POA: Insufficient documentation

## 2014-08-20 DIAGNOSIS — F259 Schizoaffective disorder, unspecified: Secondary | ICD-10-CM | POA: Insufficient documentation

## 2014-08-21 LAB — HIV-1 GENOTYPR PLUS

## 2014-08-28 ENCOUNTER — Encounter: Payer: Self-pay | Admitting: Infectious Disease

## 2014-08-28 ENCOUNTER — Ambulatory Visit (INDEPENDENT_AMBULATORY_CARE_PROVIDER_SITE_OTHER): Payer: Medicare Other | Admitting: Infectious Disease

## 2014-08-28 VITALS — BP 119/77 | HR 74 | Temp 99.1°F | Ht 66.0 in | Wt 135.0 lb

## 2014-08-28 DIAGNOSIS — F1990 Other psychoactive substance use, unspecified, uncomplicated: Secondary | ICD-10-CM | POA: Diagnosis not present

## 2014-08-28 DIAGNOSIS — R569 Unspecified convulsions: Secondary | ICD-10-CM

## 2014-08-28 DIAGNOSIS — F259 Schizoaffective disorder, unspecified: Secondary | ICD-10-CM

## 2014-08-28 DIAGNOSIS — F1991 Other psychoactive substance use, unspecified, in remission: Secondary | ICD-10-CM | POA: Insufficient documentation

## 2014-08-28 DIAGNOSIS — Z87898 Personal history of other specified conditions: Secondary | ICD-10-CM

## 2014-08-28 DIAGNOSIS — F1111 Opioid abuse, in remission: Secondary | ICD-10-CM | POA: Insufficient documentation

## 2014-08-28 DIAGNOSIS — F32A Depression, unspecified: Secondary | ICD-10-CM

## 2014-08-28 DIAGNOSIS — F329 Major depressive disorder, single episode, unspecified: Secondary | ICD-10-CM

## 2014-08-28 DIAGNOSIS — B20 Human immunodeficiency virus [HIV] disease: Secondary | ICD-10-CM

## 2014-08-28 NOTE — Progress Notes (Signed)
   Subjective:    Patient ID: Sally BangJennifer M Zygiel, female    DOB: 1980/06/12, 34 y.o.   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 She comes with her [facility] and is HIV negative. She is negative for HCV, HAV, HBV. Her last IVDU was in February of 2015.  I presented her with various options for rx for HIV and she was very interested in [facility] 5353 with DTG and 3TC  Review of Systems  Constitutional: Negative for fever, chills, diaphoresis, activity change, appetite change, fatigue and unexpected weight change.  HENT: Negative for congestion, rhinorrhea, sinus pressure, sneezing, sore throat and trouble swallowing.   Eyes: Negative for photophobia and visual disturbance.  Respiratory: Negative for cough, chest tightness, shortness of breath, wheezing and stridor.   Cardiovascular: Negative for chest pain, palpitations and leg swelling.  Gastrointestinal: Negative for nausea, vomiting, abdominal pain, diarrhea, constipation, blood in stool, abdominal distention and anal bleeding.  Genitourinary: Negative for dysuria, hematuria, flank pain and difficulty urinating.  Musculoskeletal: Negative for myalgias, back pain, joint swelling, arthralgias and gait problem.  Skin: Negative for color change, pallor, rash and wound.  Neurological: Negative for dizziness, tremors, weakness and light-headedness.  Hematological: Negative for adenopathy. Does not bruise/bleed easily.  Psychiatric/Behavioral: Negative for behavioral problems, confusion, sleep disturbance, dysphoric mood, decreased concentration and agitation.       Objective:   Physical Exam  Constitutional: She is oriented to person, place, and time. She appears well-developed and well-nourished. No distress.  HENT:  Head: Normocephalic and atraumatic.  Mouth/Throat: No oropharyngeal exudate.  Eyes: Conjunctivae and  EOM are normal. No scleral icterus.  Neck: Normal range of motion. Neck supple.  Cardiovascular: Normal rate and regular rhythm.   Pulmonary/Chest: Effort normal. No respiratory distress. She has no wheezes.  Abdominal: She exhibits no distension.  Musculoskeletal: She exhibits no edema or tenderness.  Neurological: She is alert and oriented to person, place, and time. She exhibits normal muscle tone. Coordination normal.  Skin: Skin is warm and dry. No rash noted. She is not diaphoretic. No erythema. No pallor.  Psychiatric: She has a normal mood and affect. Her behavior is normal. Judgment and thought content normal.          Assessment & Plan:   HIV disease:screen for 5353. If eligible she can start on DTG and 3tc and have all labs done through [facility]. We can also enroll her into ADAP/SPAP for other meds but no rush with that  Vaccinate for HAV and HBV when she comes for screening  I spent greater than 40 minutes with the patient including greater than 50% of time in face to face counsel of the patient and in coordination of their care.   IVDU: in remission, would consider HCV RNA if her last IVDU was in February of 2016 rather than 2015  Schizoaffective disorder: do not see any active meds for this  Seizure disorder: no active meds if on meds make sure they DO NOT interact with DTG

## 2014-09-01 ENCOUNTER — Ambulatory Visit (INDEPENDENT_AMBULATORY_CARE_PROVIDER_SITE_OTHER): Payer: Medicare Other | Admitting: *Deleted

## 2014-09-01 VITALS — BP 111/67 | HR 71 | Temp 98.5°F | Resp 16 | Ht 66.0 in | Wt 137.5 lb

## 2014-09-01 DIAGNOSIS — Z23 Encounter for immunization: Secondary | ICD-10-CM

## 2014-09-01 DIAGNOSIS — Z21 Asymptomatic human immunodeficiency virus [HIV] infection status: Secondary | ICD-10-CM

## 2014-09-01 DIAGNOSIS — Z006 Encounter for examination for normal comparison and control in clinical research program: Secondary | ICD-10-CM

## 2014-09-01 DIAGNOSIS — Z00129 Encounter for routine child health examination without abnormal findings: Secondary | ICD-10-CM

## 2014-09-01 LAB — COMPREHENSIVE METABOLIC PANEL
ALBUMIN: 4.3 g/dL (ref 3.5–5.2)
ALT: 12 U/L (ref 0–35)
AST: 15 U/L (ref 0–37)
Alkaline Phosphatase: 70 U/L (ref 39–117)
BILIRUBIN TOTAL: 0.4 mg/dL (ref 0.2–1.2)
BUN: 8 mg/dL (ref 6–23)
CO2: 27 meq/L (ref 19–32)
Calcium: 9.2 mg/dL (ref 8.4–10.5)
Chloride: 105 mEq/L (ref 96–112)
Creat: 0.71 mg/dL (ref 0.50–1.10)
Glucose, Bld: 102 mg/dL — ABNORMAL HIGH (ref 70–99)
POTASSIUM: 4.7 meq/L (ref 3.5–5.3)
Sodium: 141 mEq/L (ref 135–145)
TOTAL PROTEIN: 6.8 g/dL (ref 6.0–8.3)

## 2014-09-01 LAB — CBC WITH DIFFERENTIAL/PLATELET
BASOS ABS: 0 10*3/uL (ref 0.0–0.1)
Basophils Relative: 0 % (ref 0–1)
EOS ABS: 0.4 10*3/uL (ref 0.0–0.7)
EOS PCT: 6 % — AB (ref 0–5)
HCT: 40.6 % (ref 36.0–46.0)
Hemoglobin: 13.7 g/dL (ref 12.0–15.0)
LYMPHS ABS: 1.5 10*3/uL (ref 0.7–4.0)
Lymphocytes Relative: 23 % (ref 12–46)
MCH: 29.8 pg (ref 26.0–34.0)
MCHC: 33.7 g/dL (ref 30.0–36.0)
MCV: 88.5 fL (ref 78.0–100.0)
MPV: 8.7 fL (ref 8.6–12.4)
Monocytes Absolute: 0.3 10*3/uL (ref 0.1–1.0)
Monocytes Relative: 5 % (ref 3–12)
Neutro Abs: 4.3 10*3/uL (ref 1.7–7.7)
Neutrophils Relative %: 66 % (ref 43–77)
PLATELETS: 183 10*3/uL (ref 150–400)
RBC: 4.59 MIL/uL (ref 3.87–5.11)
RDW: 12.9 % (ref 11.5–15.5)
WBC: 6.5 10*3/uL (ref 4.0–10.5)

## 2014-09-01 LAB — BILIRUBIN,DIRECT & INDIRECT (FRACTIONATED)
Bilirubin, Direct: 0.1 mg/dL (ref 0.0–0.3)
Indirect Bilirubin: 0.3 mg/dL (ref 0.2–1.2)

## 2014-09-01 LAB — HCG, SERUM, QUALITATIVE: Preg, Serum: NEGATIVE

## 2014-09-01 NOTE — Progress Notes (Signed)
[name] is here for screening for [phone]. Informed consent was obtained after she had time to read the consent over at home and it was explained to her by both Dr. Daiva EvesVan Dam and myself. All her questions were answered and she was able to verbalize understanding of the purposes of the study and her responsibilities while on study. She denies any current problems. She went through detox/rehab  and completed it in February of this year. She says she has been doing very well and takes buprenorphine 4 x day and an occassional trazodone for sleep. If her genotype shows no resistance to integrase based regimens we plan to enroll her on 4/19.

## 2014-09-15 ENCOUNTER — Encounter: Payer: Self-pay | Admitting: Infectious Disease

## 2014-09-16 ENCOUNTER — Ambulatory Visit (INDEPENDENT_AMBULATORY_CARE_PROVIDER_SITE_OTHER): Payer: Self-pay | Admitting: *Deleted

## 2014-09-16 VITALS — BP 108/69 | HR 71 | Temp 97.8°F | Resp 16 | Ht 66.0 in | Wt 131.5 lb

## 2014-09-16 DIAGNOSIS — Z21 Asymptomatic human immunodeficiency virus [HIV] infection status: Secondary | ICD-10-CM

## 2014-09-16 DIAGNOSIS — Z006 Encounter for examination for normal comparison and control in clinical research program: Secondary | ICD-10-CM

## 2014-09-16 DIAGNOSIS — E785 Hyperlipidemia, unspecified: Secondary | ICD-10-CM

## 2014-09-16 LAB — LIPID PANEL
Cholesterol: 187 mg/dL (ref 0–200)
HDL: 30 mg/dL — ABNORMAL LOW (ref >=46)
LDL CALC: 130 mg/dL — AB (ref 0–99)
TRIGLYCERIDES: 133 mg/dL (ref <150)
Total CHOL/HDL Ratio: 6.2 Ratio
VLDL: 27 mg/dL (ref 0–40)

## 2014-09-16 LAB — POCT URINALYSIS DIPSTICK
Bilirubin, UA: NEGATIVE
Blood, UA: NEGATIVE
GLUCOSE UA: NEGATIVE
Ketones, UA: NEGATIVE
Leukocytes, UA: NEGATIVE
NITRITE UA: NEGATIVE
Protein, UA: NEGATIVE
SPEC GRAV UA: 1.015
Urobilinogen, UA: 0.2
pH, UA: 6.5

## 2014-09-16 LAB — COMPREHENSIVE METABOLIC PANEL
ALBUMIN: 4.6 g/dL (ref 3.5–5.2)
ALK PHOS: 66 U/L (ref 39–117)
ALT: 13 U/L (ref 0–35)
AST: 13 U/L (ref 0–37)
BILIRUBIN TOTAL: 0.5 mg/dL (ref 0.2–1.2)
BUN: 10 mg/dL (ref 6–23)
CO2: 25 mEq/L (ref 19–32)
Calcium: 9.4 mg/dL (ref 8.4–10.5)
Chloride: 103 mEq/L (ref 96–112)
Creat: 0.68 mg/dL (ref 0.50–1.10)
GLUCOSE: 105 mg/dL — AB (ref 70–99)
POTASSIUM: 4.6 meq/L (ref 3.5–5.3)
Sodium: 140 mEq/L (ref 135–145)
Total Protein: 7.3 g/dL (ref 6.0–8.3)

## 2014-09-16 LAB — BILIRUBIN,DIRECT & INDIRECT (FRACTIONATED)
BILIRUBIN DIRECT: 0.1 mg/dL (ref 0.0–0.3)
BILIRUBIN INDIRECT: 0.4 mg/dL (ref 0.2–1.2)

## 2014-09-16 LAB — POCT URINE PREGNANCY: Preg Test, Ur: NEGATIVE

## 2014-09-16 NOTE — Progress Notes (Signed)
Victorino DikeJennifer was here today to enroll in ACT[facility] [phone]. After eligibility was verified, a urine specimen was checked for pregnancy and confirmed negative. She was then randomized to Tivicay 50mg  and Lamivudine 300 mg daily. She plans to start the medications today. We went over dosing instructions, adherence, calling for problems and watching for signs of allergic response to the drug. She was able to verbalize all instructions thoroughly and understanding is excellent. She denies any current problems except for "bad teeth", needs some fillings. A dental referral was made. She reports doing very well with her rehab, goes to appts every 2 weeks for followup. She said her [facility] and needed copies of her resistance tests which I gave her. She will return in 2 weeks for study and will be called in 1 week to see how she is doing with medications.

## 2014-09-16 NOTE — Progress Notes (Signed)
   Subjective:    Patient ID: Sally BangJennifer M Zygiel, female    DOB: 06-16-1980, 34 y.o.   MRN: [id number]  HPI    Review of Systems  Constitutional: Negative.   HENT: Positive for dental problem.   Eyes: Negative.   Respiratory: Negative.   Cardiovascular: Negative.   Gastrointestinal: Negative.   Genitourinary: Negative.   Musculoskeletal: Negative.   Skin: Negative.   Neurological: Negative.   Psychiatric/Behavioral: Negative.        Objective:   Physical Exam  Constitutional: She is oriented to person, place, and time.  HENT:  Mouth/Throat: Oropharynx is clear and moist.  Eyes: No scleral icterus.  Neck: Normal range of motion.  Cardiovascular: Normal rate, regular rhythm, normal heart sounds and intact distal pulses.   Pulmonary/Chest: Effort normal and breath sounds normal.  Abdominal: Soft. Bowel sounds are normal.  Musculoskeletal: Normal range of motion. She exhibits no edema.  Lymphadenopathy:    She has cervical adenopathy.  Neurological: She is alert and oriented to person, place, and time.  Skin: Skin is warm and dry.  Psychiatric: She has a normal mood and affect.          Assessment & Plan:

## 2014-09-16 NOTE — Addendum Note (Signed)
Addended by: Aris LotEPPERSON, KIMBERLY C on: 09/16/2014 02:00 PM   Modules accepted: Orders

## 2014-09-17 LAB — HEPATITIS C ANTIBODY: HCV Ab: NEGATIVE

## 2014-09-30 ENCOUNTER — Encounter (INDEPENDENT_AMBULATORY_CARE_PROVIDER_SITE_OTHER): Payer: Medicare Other | Admitting: *Deleted

## 2014-09-30 VITALS — BP 106/68 | HR 65 | Temp 98.3°F | Resp 16 | Wt 129.5 lb

## 2014-09-30 DIAGNOSIS — Z006 Encounter for examination for normal comparison and control in clinical research program: Secondary | ICD-10-CM

## 2014-09-30 LAB — HIV-1 RNA QUANT-NO REFLEX-BLD: HIV 1 RNA VIRAL LOAD: 48

## 2014-09-30 NOTE — Progress Notes (Signed)
[name] is here for her week 2 A5353 study visit. She denies any complaints or any issues with taking her meds. She has not noticed any side effects and has not missed any doses. She will return in 2 weeks for the next visit.

## 2014-10-08 ENCOUNTER — Encounter (HOSPITAL_COMMUNITY): Payer: Self-pay | Admitting: *Deleted

## 2014-10-08 ENCOUNTER — Ambulatory Visit (INDEPENDENT_AMBULATORY_CARE_PROVIDER_SITE_OTHER): Payer: Medicare Other | Admitting: Infectious Disease

## 2014-10-08 ENCOUNTER — Ambulatory Visit
Admission: RE | Admit: 2014-10-08 | Discharge: 2014-10-08 | Disposition: A | Discharge status: Home / Self Care | Payer: Medicare Other | Source: Ambulatory Visit | Attending: Infectious Disease | Admitting: Infectious Disease

## 2014-10-08 ENCOUNTER — Encounter: Payer: Self-pay | Admitting: Infectious Disease

## 2014-10-08 VITALS — BP 117/78 | HR 70 | Temp 98.0°F | Wt 127.0 lb

## 2014-10-08 DIAGNOSIS — R1013 Epigastric pain: Secondary | ICD-10-CM | POA: Insufficient documentation

## 2014-10-08 DIAGNOSIS — F1991 Other psychoactive substance use, unspecified, in remission: Secondary | ICD-10-CM

## 2014-10-08 DIAGNOSIS — Z79899 Other long term (current) drug therapy: Secondary | ICD-10-CM | POA: Diagnosis not present

## 2014-10-08 DIAGNOSIS — F1990 Other psychoactive substance use, unspecified, uncomplicated: Secondary | ICD-10-CM | POA: Diagnosis not present

## 2014-10-08 DIAGNOSIS — Z3202 Encounter for pregnancy test, result negative: Secondary | ICD-10-CM | POA: Insufficient documentation

## 2014-10-08 DIAGNOSIS — Z21 Asymptomatic human immunodeficiency virus [HIV] infection status: Secondary | ICD-10-CM | POA: Diagnosis not present

## 2014-10-08 DIAGNOSIS — R1012 Left upper quadrant pain: Secondary | ICD-10-CM | POA: Diagnosis present

## 2014-10-08 DIAGNOSIS — Z87898 Personal history of other specified conditions: Secondary | ICD-10-CM

## 2014-10-08 DIAGNOSIS — R6883 Chills (without fever): Secondary | ICD-10-CM | POA: Diagnosis not present

## 2014-10-08 DIAGNOSIS — Z720 Tobacco use: Secondary | ICD-10-CM | POA: Diagnosis not present

## 2014-10-08 DIAGNOSIS — R1110 Vomiting, unspecified: Secondary | ICD-10-CM | POA: Diagnosis not present

## 2014-10-08 DIAGNOSIS — B20 Human immunodeficiency virus [HIV] disease: Secondary | ICD-10-CM | POA: Diagnosis not present

## 2014-10-08 DIAGNOSIS — R1011 Right upper quadrant pain: Secondary | ICD-10-CM | POA: Insufficient documentation

## 2014-10-08 LAB — CBC WITH DIFFERENTIAL/PLATELET
BASOS PCT: 0 % (ref 0–1)
Basophils Absolute: 0 10*3/uL (ref 0.0–0.1)
EOS ABS: 0.3 10*3/uL (ref 0.0–0.7)
Eosinophils Relative: 5 % (ref 0–5)
HEMATOCRIT: 40.4 % (ref 36.0–46.0)
HEMOGLOBIN: 13.3 g/dL (ref 12.0–15.0)
Lymphocytes Relative: 43 % (ref 12–46)
Lymphs Abs: 2.2 10*3/uL (ref 0.7–4.0)
MCH: 29.8 pg (ref 26.0–34.0)
MCHC: 32.9 g/dL (ref 30.0–36.0)
MCV: 90.6 fL (ref 78.0–100.0)
MONO ABS: 0.3 10*3/uL (ref 0.1–1.0)
MONOS PCT: 5 % (ref 3–12)
Neutro Abs: 2.3 10*3/uL (ref 1.7–7.7)
Neutrophils Relative %: 47 % (ref 43–77)
Platelets: 197 10*3/uL (ref 150–400)
RBC: 4.46 MIL/uL (ref 3.87–5.11)
RDW: 13.1 % (ref 11.5–15.5)
WBC: 5 10*3/uL (ref 4.0–10.5)

## 2014-10-08 LAB — COMPREHENSIVE METABOLIC PANEL
ALT: 32 U/L (ref 14–54)
AST: 21 U/L (ref 15–41)
Albumin: 3.8 g/dL (ref 3.5–5.0)
Alkaline Phosphatase: 63 U/L (ref 38–126)
Anion gap: 6 (ref 5–15)
BILIRUBIN TOTAL: 0.7 mg/dL (ref 0.3–1.2)
BUN: 6 mg/dL (ref 6–20)
CALCIUM: 9.4 mg/dL (ref 8.9–10.3)
CO2: 29 mmol/L (ref 22–32)
Chloride: 105 mmol/L (ref 101–111)
Creatinine, Ser: 0.84 mg/dL (ref 0.44–1.00)
Glucose, Bld: 148 mg/dL — ABNORMAL HIGH (ref 70–99)
POTASSIUM: 4.7 mmol/L (ref 3.5–5.1)
SODIUM: 140 mmol/L (ref 135–145)
Total Protein: 6.2 g/dL — ABNORMAL LOW (ref 6.5–8.1)

## 2014-10-08 LAB — LIPASE: Lipase: <10 U/L (ref 0–75)

## 2014-10-08 LAB — AMYLASE: AMYLASE: 21 U/L (ref 0–105)

## 2014-10-08 LAB — LACTIC ACID, PLASMA: LACTIC ACID: 3.3 mmol/L — ABNORMAL HIGH (ref 0.5–2.2)

## 2014-10-08 MED ORDER — DOLUTEGRAVIR SODIUM 50 MG PO TABS: 50.0000 mg | ORAL_TABLET | Freq: Every day | ORAL | Status: DC

## 2014-10-08 MED ORDER — LAMIVUDINE 300 MG PO TABS: 300.0000 mg | ORAL_TABLET | Freq: Every day | ORAL | Status: DC

## 2014-10-08 NOTE — ED Notes (Signed)
The pt is c/o abd pain just under her lt ribs since yesterday.  She saw her doctor  Earlier today and blood was drawn but it was a send out. Her pain gets worse when she lies down.  lmp none. n v last pm

## 2014-10-08 NOTE — Progress Notes (Signed)
Subjective:    Patient ID: [name], female    DOB: 1981-04-03, 34 y.o.   MRN: [id number]  HPI  [name] with HIV infection recently diagnosed and started on anti-retroviral indications in the form of TIVICAY and Epivir via the AIDS clinical trial group. She has been on therapy for nearly a month.  She presents acutely to clinic today after's developing severe left-sided abdominal pain that radiates around her left rib cage to her back and is worse with deep breathing.  She also had nausea and vomited her dinner last night has not eaten since then. She has had a cholecystectomy in the past for gallstones. She has not had other major surgery she denies any alcohol use whatsoever she is on opoid maintenance therapy to manage her opoid addiction and is free from illicit drugs.   He has no suprapubic tenderness and no dysuria.  Review of Systems  Constitutional: Positive for appetite change. Negative for fever, chills, diaphoresis, activity change, fatigue and unexpected weight change.  HENT: Negative for congestion, rhinorrhea, sinus pressure, sneezing, sore throat and trouble swallowing.   Eyes: Negative for photophobia and visual disturbance.  Respiratory: Negative for cough, chest tightness, shortness of breath, wheezing and stridor.   Cardiovascular: Negative for chest pain, palpitations and leg swelling.  Gastrointestinal: Positive for vomiting and abdominal pain. Negative for nausea, diarrhea, constipation, blood in stool, abdominal distention and anal bleeding.  Genitourinary: Negative for dysuria, hematuria, flank pain and difficulty urinating.  Musculoskeletal: Negative for myalgias, back pain, joint swelling, arthralgias and gait problem.  Skin: Negative for color change, pallor, rash and wound.  Neurological: Negative for dizziness, tremors, weakness and light-headedness.  Hematological: Negative for adenopathy. Does not bruise/bleed easily.    Psychiatric/Behavioral: Negative for behavioral problems, confusion, sleep disturbance, dysphoric mood, decreased concentration and agitation.       Objective:   Physical Exam  Constitutional: She is oriented to person, place, and time. She appears well-developed and well-nourished. No distress.  HENT:  Head: Normocephalic and atraumatic.  Mouth/Throat: No oropharyngeal exudate.  Eyes: Conjunctivae and EOM are normal. No scleral icterus.  Neck: Normal range of motion. Neck supple.  Cardiovascular: Normal rate and regular rhythm.   Pulmonary/Chest: Effort normal. No respiratory distress. She has no wheezes.  Abdominal: Soft. She exhibits no distension, no pulsatile liver, no fluid wave, no ascites and no mass. There is tenderness in the left upper quadrant. There is no rigidity, no rebound, no guarding, no CVA tenderness, no tenderness at McBurney's point and negative Murphy's sign.  Musculoskeletal: She exhibits no edema or tenderness.  Neurological: She is alert and oriented to person, place, and time. She exhibits normal muscle tone. Coordination normal.  Skin: Skin is warm and dry. No rash noted. She is not diaphoretic. No erythema. No pallor.  Psychiatric: She has a normal mood and affect. Her behavior is normal. Judgment and thought content normal.          Assessment & Plan:   Acute onset of LUQ pain: We'll check lipase amylase then, resume metabolic panel with CBC and differential. Will check a lactic acid. Worry about pancreatitis potentially due to stones in the biliary tree that is remaining.  We will ask her to go with a liquid diet for the large part and only take her antiretrovirals and then slowly advance. We'll get an x-ray of her chest and abdomen as well  HIV: continue Tivicay and 3tc. There is next to no chance that this episode  is related to her HIV therapy  Opioid maintenance being seen by clinic here in town. =

## 2014-10-08 NOTE — ED Notes (Signed)
The pt reports that she  Does not remember when her last period was

## 2014-10-09 ENCOUNTER — Emergency Department (HOSPITAL_COMMUNITY)
Admission: EM | Admit: 2014-10-09 | Discharge: 2014-10-10 | Disposition: A | Discharge status: Home / Self Care | Payer: Medicare Other | Attending: Emergency Medicine | Admitting: Emergency Medicine

## 2014-10-09 ENCOUNTER — Encounter (HOSPITAL_COMMUNITY): Payer: Self-pay | Admitting: Emergency Medicine

## 2014-10-09 ENCOUNTER — Telehealth: Payer: Self-pay | Admitting: Infectious Disease

## 2014-10-09 ENCOUNTER — Emergency Department (HOSPITAL_COMMUNITY): Payer: Medicare Other

## 2014-10-09 ENCOUNTER — Emergency Department (HOSPITAL_COMMUNITY)
Admission: EM | Admit: 2014-10-09 | Discharge: 2014-10-09 | Disposition: A | Discharge status: Home / Self Care | Payer: Medicare Other | Attending: Emergency Medicine | Admitting: Emergency Medicine

## 2014-10-09 ENCOUNTER — Telehealth: Payer: Self-pay | Admitting: *Deleted

## 2014-10-09 ENCOUNTER — Encounter (HOSPITAL_COMMUNITY): Payer: Self-pay | Admitting: Radiology

## 2014-10-09 DIAGNOSIS — R1013 Epigastric pain: Secondary | ICD-10-CM | POA: Diagnosis not present

## 2014-10-09 DIAGNOSIS — R1012 Left upper quadrant pain: Secondary | ICD-10-CM | POA: Diagnosis present

## 2014-10-09 DIAGNOSIS — R0602 Shortness of breath: Secondary | ICD-10-CM | POA: Insufficient documentation

## 2014-10-09 DIAGNOSIS — Z720 Tobacco use: Secondary | ICD-10-CM | POA: Insufficient documentation

## 2014-10-09 DIAGNOSIS — K279 Peptic ulcer, site unspecified, unspecified as acute or chronic, without hemorrhage or perforation: Secondary | ICD-10-CM | POA: Insufficient documentation

## 2014-10-09 DIAGNOSIS — Z21 Asymptomatic human immunodeficiency virus [HIV] infection status: Secondary | ICD-10-CM | POA: Diagnosis not present

## 2014-10-09 DIAGNOSIS — R109 Unspecified abdominal pain: Secondary | ICD-10-CM

## 2014-10-09 DIAGNOSIS — Z3202 Encounter for pregnancy test, result negative: Secondary | ICD-10-CM | POA: Diagnosis not present

## 2014-10-09 DIAGNOSIS — Z9049 Acquired absence of other specified parts of digestive tract: Secondary | ICD-10-CM | POA: Insufficient documentation

## 2014-10-09 DIAGNOSIS — Z79899 Other long term (current) drug therapy: Secondary | ICD-10-CM | POA: Diagnosis not present

## 2014-10-09 LAB — COMPREHENSIVE METABOLIC PANEL
ALK PHOS: 57 U/L (ref 38–126)
ALT: 34 U/L (ref 14–54)
ALT: 65 U/L — ABNORMAL HIGH (ref 14–54)
AST: 23 U/L (ref 15–41)
AST: 47 U/L — ABNORMAL HIGH (ref 15–41)
Albumin: 3.6 g/dL (ref 3.5–5.0)
Albumin: 3.8 g/dL (ref 3.5–5.0)
Alkaline Phosphatase: 60 U/L (ref 38–126)
Anion gap: 7 (ref 5–15)
Anion gap: 7 (ref 5–15)
BUN: 5 mg/dL — ABNORMAL LOW (ref 6–20)
BUN: 7 mg/dL (ref 6–20)
CALCIUM: 9.1 mg/dL (ref 8.9–10.3)
CO2: 26 mmol/L (ref 22–32)
CO2: 30 mmol/L (ref 22–32)
CREATININE: 0.66 mg/dL (ref 0.44–1.00)
CREATININE: 0.94 mg/dL (ref 0.44–1.00)
Calcium: 9.2 mg/dL (ref 8.9–10.3)
Chloride: 104 mmol/L (ref 101–111)
Chloride: 109 mmol/L (ref 101–111)
GFR calc Af Amer: >60 mL/min (ref >60)
GFR calc non Af Amer: >60 mL/min (ref >60)
GLUCOSE: 91 mg/dL (ref 65–99)
Glucose, Bld: 110 mg/dL — ABNORMAL HIGH (ref 65–99)
POTASSIUM: 3.7 mmol/L (ref 3.5–5.1)
Potassium: 4.5 mmol/L (ref 3.5–5.1)
SODIUM: 141 mmol/L (ref 135–145)
Sodium: 142 mmol/L (ref 135–145)
TOTAL PROTEIN: 6.6 g/dL (ref 6.5–8.1)
Total Bilirubin: 0.6 mg/dL (ref 0.3–1.2)
Total Bilirubin: 0.8 mg/dL (ref 0.3–1.2)
Total Protein: 6.7 g/dL (ref 6.5–8.1)

## 2014-10-09 LAB — URINALYSIS, ROUTINE W REFLEX MICROSCOPIC
Bilirubin Urine: NEGATIVE
GLUCOSE, UA: NEGATIVE mg/dL
Glucose, UA: NEGATIVE mg/dL
HGB URINE DIPSTICK: NEGATIVE
Hgb urine dipstick: NEGATIVE
KETONES UR: 15 mg/dL — AB
Ketones, ur: 15 mg/dL — AB
LEUKOCYTES UA: NEGATIVE
Leukocytes, UA: NEGATIVE
NITRITE: NEGATIVE
Nitrite: NEGATIVE
PH: 7 (ref 5.0–8.0)
PROTEIN: NEGATIVE mg/dL
PROTEIN: NEGATIVE mg/dL
SPECIFIC GRAVITY, URINE: 1.014 (ref 1.005–1.030)
Specific Gravity, Urine: 1.023 (ref 1.005–1.030)
UROBILINOGEN UA: 1 mg/dL (ref 0.0–1.0)
UROBILINOGEN UA: 1 mg/dL (ref 0.0–1.0)
pH: 6.5 (ref 5.0–8.0)

## 2014-10-09 LAB — CBC WITH DIFFERENTIAL/PLATELET
BASOS ABS: 0 10*3/uL (ref 0.0–0.1)
Basophils Absolute: 0 10*3/uL (ref 0.0–0.1)
Basophils Relative: 0 % (ref 0–1)
Basophils Relative: 0 % (ref 0–1)
EOS ABS: 0.4 10*3/uL (ref 0.0–0.7)
EOS PCT: 5 % (ref 0–5)
EOS PCT: 8 % — AB (ref 0–5)
Eosinophils Absolute: 0.4 10*3/uL (ref 0.0–0.7)
HCT: 40.7 % (ref 36.0–46.0)
HEMATOCRIT: 38.7 % (ref 36.0–46.0)
Hemoglobin: 12.4 g/dL (ref 12.0–15.0)
Hemoglobin: 13.5 g/dL (ref 12.0–15.0)
LYMPHS ABS: 2.2 10*3/uL (ref 0.7–4.0)
LYMPHS ABS: 3.1 10*3/uL (ref 0.7–4.0)
Lymphocytes Relative: 36 % (ref 12–46)
Lymphocytes Relative: 45 % (ref 12–46)
MCH: 29.3 pg (ref 26.0–34.0)
MCH: 29.9 pg (ref 26.0–34.0)
MCHC: 32 g/dL (ref 30.0–36.0)
MCHC: 33.2 g/dL (ref 30.0–36.0)
MCV: 90 fL (ref 78.0–100.0)
MCV: 91.5 fL (ref 78.0–100.0)
MONOS PCT: 5 % (ref 3–12)
Monocytes Absolute: 0.3 10*3/uL (ref 0.1–1.0)
Monocytes Absolute: 0.4 10*3/uL (ref 0.1–1.0)
Monocytes Relative: 6 % (ref 3–12)
Neutro Abs: 2 10*3/uL (ref 1.7–7.7)
Neutro Abs: 4.6 10*3/uL (ref 1.7–7.7)
Neutrophils Relative %: 41 % — ABNORMAL LOW (ref 43–77)
Neutrophils Relative %: 54 % (ref 43–77)
PLATELETS: 178 10*3/uL (ref 150–400)
PLATELETS: 193 10*3/uL (ref 150–400)
RBC: 4.23 MIL/uL (ref 3.87–5.11)
RBC: 4.52 MIL/uL (ref 3.87–5.11)
RDW: 13.1 % (ref 11.5–15.5)
RDW: 13.4 % (ref 11.5–15.5)
WBC: 4.9 10*3/uL (ref 4.0–10.5)
WBC: 8.6 10*3/uL (ref 4.0–10.5)

## 2014-10-09 LAB — POC URINE PREG, ED
Preg Test, Ur: NEGATIVE
Preg Test, Ur: NEGATIVE

## 2014-10-09 LAB — LIPASE, BLOOD
LIPASE: 16 U/L — AB (ref 22–51)
Lipase: <10 U/L — ABNORMAL LOW (ref 22–51)

## 2014-10-09 MED ORDER — ONDANSETRON HCL 4 MG/2ML IJ SOLN
4.0000 mg | Freq: Once | INTRAMUSCULAR | Status: AC
  Administered 2014-10-09: 4 mg via INTRAVENOUS
  Filled 2014-10-09: qty 2

## 2014-10-09 MED ORDER — PANTOPRAZOLE SODIUM 40 MG IV SOLR
40.0000 mg | Freq: Once | INTRAVENOUS | Status: AC
  Administered 2014-10-09: 40 mg via INTRAVENOUS
  Filled 2014-10-09: qty 40

## 2014-10-09 MED ORDER — FENTANYL CITRATE (PF) 100 MCG/2ML IJ SOLN
50.0000 ug | Freq: Once | INTRAMUSCULAR | Status: AC
  Administered 2014-10-09: 50 ug via INTRAVENOUS
  Filled 2014-10-09: qty 2

## 2014-10-09 MED ORDER — SODIUM CHLORIDE 0.9 % IV SOLN
1000.0000 mL | INTRAVENOUS | Status: DC
  Administered 2014-10-09: 1000 mL via INTRAVENOUS

## 2014-10-09 MED ORDER — IOHEXOL 300 MG/ML  SOLN
100.0000 mL | Freq: Once | INTRAMUSCULAR | Status: AC | PRN
  Administered 2014-10-09: 100 mL via INTRAVENOUS

## 2014-10-09 MED ORDER — TRAMADOL HCL 50 MG PO TABS
50.0000 mg | ORAL_TABLET | Freq: Four times a day (QID) | ORAL | Status: DC | PRN
Start: 2014-10-09 — End: 2014-12-03

## 2014-10-09 MED ORDER — MORPHINE SULFATE 4 MG/ML IJ SOLN
4.0000 mg | Freq: Once | INTRAMUSCULAR | Status: AC
  Administered 2014-10-09: 4 mg via INTRAVENOUS
  Filled 2014-10-09: qty 1

## 2014-10-09 MED ORDER — IOHEXOL 300 MG/ML  SOLN
25.0000 mL | Freq: Once | INTRAMUSCULAR | Status: AC | PRN
  Administered 2014-10-09: 25 mL via ORAL

## 2014-10-09 MED ORDER — SODIUM CHLORIDE 0.9 % IV SOLN
1000.0000 mL | Freq: Once | INTRAVENOUS | Status: AC
  Administered 2014-10-09: 1000 mL via INTRAVENOUS

## 2014-10-09 NOTE — Telephone Encounter (Signed)
Her labs were unremarkable other than her lactic acid being up slightly when I saw her in clinic. She will probably need to see a GI MD. I am a bit concerned with her known opiate addiction as well. She is being managed for that but I worry about that playing a role

## 2014-10-09 NOTE — ED Notes (Signed)
Patient is currently consuming contrast.

## 2014-10-09 NOTE — ED Notes (Signed)
Dr. Glick at the bedside.  

## 2014-10-09 NOTE — ED Notes (Signed)
Pt  states she is having abd pain that started about 48 hrs ago  Pt states the pain is under her left rib cage area and moves around to her back  Pt states she had vomiting when the pain first started and then had more vomiting today   Pt states pain increases with movement and with breathing  Pt states the pain is worse when she lays down

## 2014-10-09 NOTE — Discharge Instructions (Signed)
Here CAT scan and blood work did not give an expiration for what is causing your pain. Return to the emergency department if pain is not being adequately controlled at home. Otherwise, see your primary care provider next week. If symptoms persist, you may need referral to a gastroenterologist.  Abdominal Pain Many things can cause abdominal pain. Usually, abdominal pain is not caused by a disease and will improve without treatment. It can often be observed and treated at home. Your health care provider will do a physical exam and possibly order blood tests and X-rays to help determine the seriousness of your pain. However, in many cases, more time must pass before a clear cause of the pain can be found. Before that point, your health care provider may not know if you need more testing or further treatment. HOME CARE INSTRUCTIONS  Monitor your abdominal pain for any changes. The following actions may help to alleviate any discomfort you are experiencing:  Only take over-the-counter or prescription medicines as directed by your health care provider.  Do not take laxatives unless directed to do so by your health care provider.  Try a clear liquid diet (broth, tea, or water) as directed by your health care provider. Slowly move to a bland diet as tolerated. SEEK MEDICAL CARE IF:  You have unexplained abdominal pain.  You have abdominal pain associated with nausea or diarrhea.  You have pain when you urinate or have a bowel movement.  You experience abdominal pain that wakes you in the night.  You have abdominal pain that is worsened or improved by eating food.  You have abdominal pain that is worsened with eating fatty foods.  You have a fever. SEEK IMMEDIATE MEDICAL CARE IF:   Your pain does not go away within 2 hours.  You keep throwing up (vomiting).  Your pain is felt only in portions of the abdomen, such as the right side or the left lower portion of the abdomen.  You pass bloody  or black tarry stools. MAKE SURE YOU:  Understand these instructions.   Will watch your condition.   Will get help right away if you are not doing well or get worse.  Document Released: 02/23/2005 Document Revised: 05/21/2013 Document Reviewed: 01/23/2013 [facility] Patient Information 2015 [place]. This information is not intended to replace advice given to you by your health care provider. Make sure you discuss any questions you have with your health care provider.  Trametinib oral tablets What is this medicine? TRAMETINIB (tra me ti nib) is used to treat certain types of melanoma, a skin cancer. It targets specific cancer cells and stops the cancer cells from growing. This medicine may be used for other purposes; ask your health care provider or pharmacist if you have questions. COMMON BRAND NAME(S): Mekinist What should I tell my health care provider before I take this medicine? They need to know if you have any of these conditions: -eye disease, vision problems -heart disease -high blood pressure -kidney disease -liver disease -an unusual or allergic reaction to trametinib, other medicines, foods, dyes, or preservatives -pregnant or trying to get pregnant -breast-feeding How should I use this medicine? Take this medicine by mouth with a glass of water. Follow the directions on the prescription label. Take this medicine on an empty stomach, at least 1 hour before or 2 hours after food. Do not take with food. Take your medicine at regular intervals. Do not take it more often than directed. Do not stop taking except  on your doctor's advice. Talk to your pediatrician regarding the use of this medicine in children. Special care may be needed. Overdosage: If you think you've taken too much of this medicine contact a poison control center or emergency room at once. Overdosage: If you think you have taken too much of this medicine contact a poison control center or emergency room  at once. NOTE: This medicine is only for you. Do not share this medicine with others. What if I miss a dose? If you miss a dose, take it as soon as you can. If your next dose is to be taken in less than 12 hours, then do not take the missed dose. Take the next dose at your regular time. Do not take double or extra doses. What may interact with this medicine? Drug interactions have not been identified. Give your health care provider a list of all the medicines, herbs, non-prescription drugs, or dietary supplements you use. Also tell them if you smoke, drink alcohol, or use illegal drugs. Some items may interact with your medicine. This list may not describe all possible interactions. Give your health care provider a list of all the medicines, herbs, non-prescription drugs, or dietary supplements you use. Also tell them if you smoke, drink alcohol, or use illegal drugs. Some items may interact with your medicine. What should I watch for while using this medicine? Visit your doctor or health care professional for regular checks on your progress. Tell your doctor or health care professional right away if you have any change in your eyesight. Do not become pregnant while taking this medicine or for 4 months after stopping it. Women should inform their doctor if they wish to become pregnant or think they might be pregnant. There is a potential for serious side effects to an unborn child. Talk to your health care professional or pharmacist for more information. Do not breast-feed an infant while taking this medicine. In women, this medicine may interfere with the ability to have a child. You should talk with your doctor or health care professional if you are concerned about your fertility. What side effects may I notice from receiving this medicine? Side effects that you should report to your doctor or health care professional as soon as possible: -allergic reactions like skin rash, itching or hives,  swelling of the face, lips, or tongue -breathing problems -changes in vision -cough -dizziness -fast, irregular heartbeat -feeling faint or lightheaded -redness, swelling, or sores on hands or feet -swelling of the ankles Side effects that usually do not require medical attention (Report these to your doctor or health care professional if they continue or are bothersome.): -diarrhea -sore throat -stomach pain This list may not describe all possible side effects. Call your doctor for medical advice about side effects. You may report side effects to FDA at 1-800-FDA-1088. Where should I keep my medicine? Store between 2 and 8 degrees C (36 and 46 degrees F). Do not freeze. Keep this medicine in the original container. Throw away any unused medicine after the expiration date. Keep out of the reach of children. NOTE: This sheet is a summary. It may not cover all possible information. If you have questions about this medicine, talk to your doctor, pharmacist, or health care provider.  2015, [employer]. (2011-11-08 14:35:01)

## 2014-10-09 NOTE — ED Provider Notes (Signed)
CSN: [id number]     Arrival date & time 10/08/14  2339 History  This chart was scribed for Dione Boozeavid Makylie Rivere, MD by Abel PrestoKara Demonbreun, ED Scribe. This patient was seen in room A03C/A03C and the patient's care was started at 1:41 AM.    Chief Complaint  Patient presents with  . Abdominal Pain    Patient is a 34 y.o. female presenting with abdominal pain. The history is provided by the patient. No language interpreter was used.  Abdominal Pain Associated symptoms: chills and vomiting   Associated symptoms: no diarrhea, no dysuria and no fatigue    HPI Comments: [name] is a 34 y.o. female who presents to the Emergency Department complaining of LUQ pain with onset on 09/07/14 around dinner time. Pt was seen by Dr. Daiva EvesVan Dam, infectious disease specialist, yesterday and reports labs done with significant findings. Pt states "It feels like it's inside my rib cage kinda like a diaphragm feeling and then works its way to my back and you can't even touch me it hurts so much."  She states lying down aggravates the pain. Pt notes associated vomiting, chills, diaphoresis, urinary urgency and retention. Pt denies fever, urinary frequency, dysuria, and diarrhea.    Past Medical History  Diagnosis Date  . HIV infection   . Abdominal pain, left upper quadrant 10/08/2014   Past Surgical History  Procedure Laterality Date  . Cholecystectomy    . Fracture surgery     Family History  Problem Relation Age of Onset  . Diabetes Maternal Grandfather    History  Substance Use Topics  . Smoking status: Current Every Day Smoker -- 20 years  . Smokeless tobacco: Never Used  . Alcohol Use: 0.0 oz/week    0 Standard drinks or equivalent per week   OB History    No data available     Review of Systems  Constitutional: Positive for chills and diaphoresis. Negative for fatigue.  Gastrointestinal: Positive for vomiting and abdominal pain. Negative for diarrhea.  Genitourinary: Positive for urgency,  decreased urine volume and difficulty urinating. Negative for dysuria and frequency.  All other systems reviewed and are negative.     Allergies  Review of patient's allergies indicates no known allergies.  Home Medications   Prior to Admission medications   Medication Sig Start Date End Date Taking? Authorizing Provider  acetaminophen (TYLENOL) 500 MG tablet Take 500-1,000 mg by mouth every 6 (six) hours as needed (for pain).    Historical Provider, MD  buprenorphine (SUBUTEX) 8 MG SUBL SL tablet Place 8 mg under the tongue daily.    Historical Provider, MD  dolutegravir (TIVICAY) 50 MG tablet Take 1 tablet (50 mg total) by mouth daily. 10/08/14   Randall Hissornelius N Van Dam, MD  lamivudine (EPIVIR) 300 MG tablet Take 1 tablet (300 mg total) by mouth daily. 10/08/14   Randall Hissornelius N Van Dam, MD   BP 107/64 mmHg  Pulse 61  Temp(Src) 98.4 F (36.9 C)  Resp 18  Wt 128 lb (58.06 kg)  SpO2 99% Physical Exam  Constitutional: She is oriented to person, place, and time. She appears well-developed and well-nourished.  HENT:  Head: Normocephalic and atraumatic.  Eyes: Conjunctivae are normal. Pupils are equal, round, and reactive to light.  Neck: Normal range of motion. Neck supple. No JVD present.  Cardiovascular: Normal rate, regular rhythm and normal heart sounds.   No murmur heard. Pulmonary/Chest: Effort normal and breath sounds normal. She has no wheezes. She has no rales. She  exhibits no tenderness.  Abdominal: Soft. She exhibits no distension and no mass. Bowel sounds are decreased. There is tenderness (moderate) in the epigastric area and left upper quadrant. There is no rebound and no guarding.  Musculoskeletal: Normal range of motion. She exhibits no edema.  Lymphadenopathy:    She has no cervical adenopathy.  Neurological: She is alert and oriented to person, place, and time. No cranial nerve deficit. She exhibits normal muscle tone. Coordination normal.  Skin: Skin is warm and dry. No  rash noted.  Psychiatric: She has a normal mood and affect. Her behavior is normal. Judgment and thought content normal.  Nursing note and vitals reviewed.   ED Course  Procedures (including critical care time) DIAGNOSTIC STUDIES: Oxygen Saturation is 99% on room air, normal by my interpretation.    COORDINATION OF CARE: 1:49 AM Discussed treatment plan with patient at beside, the patient agrees with the plan and has no further questions at this time.   Labs Review Results for orders placed or performed during the hospital encounter of 10/09/14  CBC with Differential  Result Value Ref Range   WBC 8.6 4.0 - 10.5 K/uL   RBC 4.52 3.87 - 5.11 MIL/uL   Hemoglobin 13.5 12.0 - 15.0 g/dL   HCT 11.940.7 14.736.0 - 82.946.0 %   MCV 90.0 78.0 - 100.0 fL   MCH 29.9 26.0 - 34.0 pg   MCHC 33.2 30.0 - 36.0 g/dL   RDW 56.213.1 13.011.5 - 86.515.5 %   Platelets 193 150 - 400 K/uL   Neutrophils Relative % 54 43 - 77 %   Neutro Abs 4.6 1.7 - 7.7 K/uL   Lymphocytes Relative 36 12 - 46 %   Lymphs Abs 3.1 0.7 - 4.0 K/uL   Monocytes Relative 5 3 - 12 %   Monocytes Absolute 0.4 0.1 - 1.0 K/uL   Eosinophils Relative 5 0 - 5 %   Eosinophils Absolute 0.4 0.0 - 0.7 K/uL   Basophils Relative 0 0 - 1 %   Basophils Absolute 0.0 0.0 - 0.1 K/uL  Comprehensive metabolic panel  Result Value Ref Range   Sodium 141 135 - 145 mmol/L   Potassium 4.5 3.5 - 5.1 mmol/L   Chloride 104 101 - 111 mmol/L   CO2 30 22 - 32 mmol/L   Glucose, Bld 110 (H) 65 - 99 mg/dL   BUN 5 (L) 6 - 20 mg/dL   Creatinine, Ser 7.840.94 0.44 - 1.00 mg/dL   Calcium 9.2 8.9 - 69.610.3 mg/dL   Total Protein 6.6 6.5 - 8.1 g/dL   Albumin 3.6 3.5 - 5.0 g/dL   AST 23 15 - 41 U/L   ALT 34 14 - 54 U/L   Alkaline Phosphatase 60 38 - 126 U/L   Total Bilirubin 0.6 0.3 - 1.2 mg/dL   GFR calc non Af Amer >60 >60 mL/min   GFR calc Af Amer >60 >60 mL/min   Anion gap 7 5 - 15  Lipase, blood  Result Value Ref Range   Lipase 16 (L) 22 - 51 U/L  Urinalysis, Routine w reflex  microscopic  Result Value Ref Range   Color, Urine YELLOW YELLOW   APPearance CLOUDY (A) CLEAR   Specific Gravity, Urine 1.023 1.005 - 1.030   pH 6.5 5.0 - 8.0   Glucose, UA NEGATIVE NEGATIVE mg/dL   Hgb urine dipstick NEGATIVE NEGATIVE   Bilirubin Urine SMALL (A) NEGATIVE   Ketones, ur 15 (A) NEGATIVE mg/dL   Protein, ur NEGATIVE  NEGATIVE mg/dL   Urobilinogen, UA 1.0 0.0 - 1.0 mg/dL   Nitrite NEGATIVE NEGATIVE   Leukocytes, UA NEGATIVE NEGATIVE  POC Urine Pregnancy, ED  (If Pre-menopausal female)  not at [facility]  Result Value Ref Range   Preg Test, Ur NEGATIVE NEGATIVE   Imaging Review Ct Abdomen Pelvis W Contrast  10/09/2014   CLINICAL DATA:  Abdominal pain under the left rib since yesterday. Pain worse when she lies down.  EXAM: CT ABDOMEN AND PELVIS WITH CONTRAST  TECHNIQUE: Multidetector CT imaging of the abdomen and pelvis was performed using the standard protocol following bolus administration of intravenous contrast.  CONTRAST:  OMNIPAQUE IOHEXOL 300 MG/ML  SOLN  COMPARISON:  Abdominal series 5/11 /2016  FINDINGS: Lung bases are clear.  The liver, spleen, pancreas, adrenal glands, kidneys, abdominal aorta, inferior vena cava, and retroperitoneal lymph nodes are unremarkable. Surgical absence of the gallbladder. No bile duct dilatation. Stomach, small bowel, and colon are not abnormally distended. Contrast material flows to the terminal ileum without evidence of bowel obstruction. No free air or free fluid in the abdomen. Abdominal wall musculature appears intact.  Pelvis: Uterus and ovaries are not enlarged. Bladder wall is not thickened. No free or loculated pelvic fluid collections. No pelvic mass or lymphadenopathy. Appendix is normal. No destructive bone lesions.  IMPRESSION: No acute process demonstrated in the abdomen or pelvis. No inflammatory changes. No evidence of bowel obstruction.   Electronically Signed   By: Burman Nieves M.D.   On: 10/09/2014 03:10   Dg Abd Acute  W/chest  10/08/2014   CLINICAL DATA:  Left upper quadrant pain under anterior rib cage. Nausea vomiting. Shortness of breath. HIV. Smoker.  EXAM: DG ABDOMEN ACUTE W/ 1V CHEST  COMPARISON:  Chest radiograph of 02/13/2014. No prior abdominal imaging.  FINDINGS: Frontal view of the chest demonstrates midline trachea. Normal heart size and mediastinal contours. No pleural effusion or pneumothorax. Clear lungs.  Abdominal films demonstrate no free intraperitoneal air or significant air-fluid levels on upright positioning. No bowel distention on supine imaging. Cholecystectomy clips. Distal stool. No abnormal abdominal calcifications. No appendicolith.  IMPRESSION: No acute findings.   Electronically Signed   By: Jeronimo Greaves M.D.   On: 10/08/2014 14:14    MDM   Final diagnoses:  Abdominal pain, unspecified abdominal location    Abdominal pain of uncertain cause. Laboratory workup is unremarkable. She is sent for a CT of abdomen and pelvis which is also unremarkable. She is discharged with prescription for tramadol for pain and is to follow-up with her PCP, consider referral to GI for further workup.   I personally performed the services described in this documentation, which was scribed in my presence. The recorded information has been reviewed and is accurate.       Dione Booze, MD 10/09/14 [phone]

## 2014-10-09 NOTE — Telephone Encounter (Signed)
Sally DikeJennifer called and said she is still having very bad excruciating abdominal pain, nausea and vomiting.Also, c/o headache, chills. She said she is drinking fluids but not eating. She went to the ED last night and they sent her home with no definite DX. She did not think it was her ARVs. I discussed the possibility of it being a viral illness, she has all ready had her gallbladder removed and enzymes were negative. I told her if it got worse she would have to go back to the ED for treatment, especially if she is unable to keep down any fluids and got dehydrated.

## 2014-10-09 NOTE — Telephone Encounter (Signed)
Looks like she is back in the ED and had completely normal CT abdomen this am.  This is not related to her ARV in my opinion. Perhaps she needs cardio pulmonary workup given that it could be referred pain and was worse with deep inspiration?? Odd location. Will touch base with you Selena BattenKim tomorrow am

## 2014-10-10 ENCOUNTER — Emergency Department (HOSPITAL_COMMUNITY): Payer: Medicare Other

## 2014-10-10 ENCOUNTER — Encounter (HOSPITAL_COMMUNITY): Payer: Self-pay

## 2014-10-10 LAB — TROPONIN I: Troponin I: <0.03 ng/mL (ref <0.031)

## 2014-10-10 MED ORDER — SUCRALFATE 1 GM/10ML PO SUSP: 1.0000 g | Freq: Three times a day (TID) | ORAL | Status: DC

## 2014-10-10 MED ORDER — IOHEXOL 350 MG/ML SOLN
100.0000 mL | Freq: Once | INTRAVENOUS | Status: AC | PRN
  Administered 2014-10-10: 100 mL via INTRAVENOUS

## 2014-10-10 MED ORDER — PANTOPRAZOLE SODIUM 20 MG PO TBEC: 20.0000 mg | DELAYED_RELEASE_TABLET | Freq: Every day | ORAL | Status: DC

## 2014-10-10 MED ORDER — PANTOPRAZOLE SODIUM 40 MG IV SOLR
40.0000 mg | INTRAVENOUS | Status: AC
  Administered 2014-10-10: 40 mg via INTRAVENOUS
  Filled 2014-10-10: qty 40

## 2014-10-10 MED ORDER — GI COCKTAIL ~~LOC~~
30.0000 mL | Freq: Once | ORAL | Status: AC
  Administered 2014-10-10: 30 mL via ORAL
  Filled 2014-10-10: qty 30

## 2014-10-10 MED ORDER — HYDROMORPHONE HCL 1 MG/ML IJ SOLN
1.0000 mg | Freq: Once | INTRAMUSCULAR | Status: AC
  Administered 2014-10-10: 1 mg via INTRAVENOUS
  Filled 2014-10-10: qty 1

## 2014-10-10 MED ORDER — METOCLOPRAMIDE HCL 5 MG/ML IJ SOLN
10.0000 mg | INTRAMUSCULAR | Status: AC
  Administered 2014-10-10: 10 mg via INTRAVENOUS
  Filled 2014-10-10: qty 2

## 2014-10-10 NOTE — Discharge Instructions (Signed)
Abdominal Pain °Many things can cause abdominal pain. Usually, abdominal pain is not caused by a disease and will improve without treatment. It can often be observed and treated at home. Your health care provider will do a physical exam and possibly order blood tests and X-rays to help determine the seriousness of your pain. However, in many cases, more time must pass before a clear cause of the pain can be found. Before that point, your health care provider may not know if you need more testing or further treatment. °HOME CARE INSTRUCTIONS  °Monitor your abdominal pain for any changes. The following actions may help to alleviate any discomfort you are experiencing: °· Only take over-the-counter or prescription medicines as directed by your health care provider. °· Do not take laxatives unless directed to do so by your health care provider. °· Try a clear liquid diet (broth, tea, or water) as directed by your health care provider. Slowly move to a bland diet as tolerated. °SEEK MEDICAL CARE IF: °· You have unexplained abdominal pain. °· You have abdominal pain associated with nausea or diarrhea. °· You have pain when you urinate or have a bowel movement. °· You experience abdominal pain that wakes you in the night. °· You have abdominal pain that is worsened or improved by eating food. °· You have abdominal pain that is worsened with eating fatty foods. °· You have a fever. °SEEK IMMEDIATE MEDICAL CARE IF:  °· Your pain does not go away within 2 hours. °· You keep throwing up (vomiting). °· Your pain is felt only in portions of the abdomen, such as the right side or the left lower portion of the abdomen. °· You pass bloody or black tarry stools. °MAKE SURE YOU: °· Understand these instructions.   °· Will watch your condition.   °· Will get help right away if you are not doing well or get worse.   °Document Released: 02/23/2005 Document Revised: 05/21/2013 Document Reviewed: 01/23/2013 °[employer]® Patient Information  ©2015 [employer]. This information is not intended to replace advice given to you by your health care provider. Make sure you discuss any questions you have with your health care provider. °Peptic Ulcer °A peptic ulcer is a sore in the lining of your esophagus (esophageal ulcer), stomach (gastric ulcer), or in the first part of your small intestine (duodenal ulcer). The ulcer causes erosion into the deeper tissue. °CAUSES  °Normally, the lining of the stomach and the small intestine protects itself from the acid that digests food. The protective lining can be damaged by: °· An infection caused by a bacterium called Helicobacter pylori (H. pylori). °· Regular use of nonsteroidal anti-inflammatory drugs (NSAIDs), such as ibuprofen or aspirin. °· Smoking tobacco. °Other risk factors include being older than 50, drinking alcohol excessively, and having a family history of ulcer disease.  °SYMPTOMS  °· Burning pain or gnawing in the area between the chest and the belly button. °· Heartburn. °· Nausea and vomiting. °· Bloating. °The pain can be worse on an empty stomach and at night. If the ulcer results in bleeding, it can cause: °· Black, tarry stools. °· Vomiting of bright red blood. °· Vomiting of coffee-ground-looking materials. °DIAGNOSIS  °A diagnosis is usually made based upon your history and an exam. Other tests and procedures may be performed to find the cause of the ulcer. Finding a cause will help determine the best treatment. Tests and procedures may include: °· Blood tests, stool tests, or breath tests to check for the bacterium H.   pylori. °· An upper gastrointestinal (GI) series of the esophagus, stomach, and small intestine. °· An endoscopy to examine the esophagus, stomach, and small intestine. °· A biopsy. °TREATMENT  °Treatment may include: °· Eliminating the cause of the ulcer, such as smoking, NSAIDs, or alcohol. °· Medicines to reduce the amount of acid in your digestive tract. °· Antibiotic  medicines if the ulcer is caused by the H. pylori bacterium. °· An upper endoscopy to treat a bleeding ulcer. °· Surgery if the bleeding is severe or if the ulcer created a hole somewhere in the digestive system. °HOME CARE INSTRUCTIONS  °· Avoid tobacco, alcohol, and caffeine. Smoking can increase the acid in the stomach, and continued smoking will impair the healing of ulcers. °· Avoid foods and drinks that seem to cause discomfort or aggravate your ulcer. °· Only take medicines as directed by your caregiver. Do not substitute over-the-counter medicines for prescription medicines without talking to your caregiver. °· Keep any follow-up appointments and tests as directed. °SEEK MEDICAL CARE IF:  °· Your do not improve within 7 days of starting treatment. °· You have ongoing indigestion or heartburn. °SEEK IMMEDIATE MEDICAL CARE IF:  °· You have sudden, sharp, or persistent abdominal pain. °· You have bloody or dark black, tarry stools. °· You vomit blood or vomit that looks like coffee grounds. °· You become light-headed, weak, or feel faint. °· You become sweaty or clammy. °MAKE SURE YOU:  °· Understand these instructions. °· Will watch your condition. °· Will get help right away if you are not doing well or get worse. °Document Released: 05/13/2000 Document Revised: 09/30/2013 Document Reviewed: 12/14/2011 °[employer]® Patient Information ©2015 [employer]. This information is not intended to replace advice given to you by your health care provider. Make sure you discuss any questions you have with your health care provider. ° °

## 2014-10-10 NOTE — ED Notes (Signed)
Patient is alert and oriented x3.  She was given DC instructions and follow up visit instructions.  Patient gave verbal understanding. She was DC ambulatory under her own power to home.  V/S stable.  He was not showing any signs of distress on DC 

## 2014-10-10 NOTE — ED Provider Notes (Signed)
CSN: [id number]     Arrival date & time 10/09/14  2026 History   First MD Initiated Contact with Patient 10/09/14 2154     Chief Complaint  Patient presents with  . Abdominal Pain    (Consider location/radiation/quality/duration/timing/severity/associated sxs/prior Treatment) HPI Comments: Patient is a 34 year old female with a history of HIV who presents to the emergency department for further evaluation of left upper quadrant abdominal pain. Patient states that pain began 48 hours ago. Pain has been constant and is worse when supine. She denies any alleviating factors of her symptoms. She states that the pain is "excruciating". With deep breathing, the pain is stabbing. She states that she feels as though she cannot take a deep breath secondary to the pain. Pain radiates from her left upper quadrant around to her left thoracic back. She denies associated fever, chest pain, syncope, diarrhea, melena or hematochezia, dysuria, leg swelling, recent surgeries or hospitalizations, recent travel. No family history of pulmonary embolus or DVT. Last CD4 count - 780.  Patient is a 34 y.o. female presenting with abdominal pain. The history is provided by the patient. No language interpreter was used.  Abdominal Pain Associated symptoms: shortness of breath   Associated symptoms: no dysuria, no fever and no vomiting     Past Medical History  Diagnosis Date  . HIV infection   . Abdominal pain, left upper quadrant 10/08/2014   Past Surgical History  Procedure Laterality Date  . Cholecystectomy    . Fracture surgery     Family History  Problem Relation Age of Onset  . Diabetes Maternal Grandfather    History  Substance Use Topics  . Smoking status: Current Every Day Smoker -- 20 years  . Smokeless tobacco: Never Used  . Alcohol Use: 0.0 oz/week    0 Standard drinks or equivalent per week     Comment: twice a year    OB History    No data available      Review of Systems   Constitutional: Negative for fever.  Respiratory: Positive for shortness of breath.   Gastrointestinal: Positive for abdominal pain. Negative for vomiting.  Genitourinary: Negative for dysuria.  Neurological: Negative for syncope.  All other systems reviewed and are negative.   Allergies  Review of patient's allergies indicates no known allergies.  Home Medications   Prior to Admission medications   Medication Sig Start Date End Date Taking? Authorizing Provider  acetaminophen (TYLENOL) 500 MG tablet Take 500-1,000 mg by mouth every 6 (six) hours as needed (for pain).   Yes Historical Provider, MD  buprenorphine (SUBUTEX) 8 MG SUBL SL tablet Place 4 mg under the tongue daily.    Yes Historical Provider, MD  dolutegravir (TIVICAY) 50 MG tablet Take 1 tablet (50 mg total) by mouth daily. 10/08/14  Yes Randall Hissornelius N Van Dam, MD  lamivudine (EPIVIR) 300 MG tablet Take 1 tablet (300 mg total) by mouth daily. 10/08/14  Yes Randall Hissornelius N Van Dam, MD  traMADol (ULTRAM) 50 MG tablet Take 1 tablet (50 mg total) by mouth every 6 (six) hours as needed. 10/09/14  Yes Dione Boozeavid Glick, MD  pantoprazole (PROTONIX) 20 MG tablet Take 1 tablet (20 mg total) by mouth daily. 10/10/14   Antony MaduraKelly Jadrien Narine, PA-C  sucralfate (CARAFATE) 1 GM/10ML suspension Take 10 mLs (1 g total) by mouth 4 (four) times daily -  with meals and at bedtime. 10/10/14   Antony MaduraKelly Quavis Klutz, PA-C   BP 120/71 mmHg  Pulse 53  Temp(Src) 98 F (36.7 C) (  Oral)  Resp 16  SpO2 98%  LMP    Physical Exam  Constitutional: She is oriented to person, place, and time. She appears well-developed and well-nourished. No distress.  Nontoxic/nonseptic appearing  HENT:  Head: Normocephalic and atraumatic.  Eyes: Conjunctivae and EOM are normal. No scleral icterus.  Neck: Normal range of motion.  Cardiovascular: Normal rate, regular rhythm and intact distal pulses.   Pulmonary/Chest: Effort normal and breath sounds normal. No respiratory distress. She has no wheezes.  She has no rales.  Lungs clear to auscultation bilaterally  Abdominal: Soft. She exhibits no distension. There is no rebound and no guarding.  Abdomen is soft. There is mild tenderness in the epigastric region and left upper quadrant. Patient states that it feels as though palpating her upper abdomen worsens the pain in her back. No peritoneal signs or involuntary guarding.  Musculoskeletal: Normal range of motion.  Neurological: She is alert and oriented to person, place, and time. She exhibits normal muscle tone. Coordination normal.  Skin: Skin is warm and dry. No rash noted. She is not diaphoretic. No erythema. No pallor.  Psychiatric: She has a normal mood and affect. Her behavior is normal.  Nursing note and vitals reviewed.   ED Course  Procedures (including critical care time) Labs Review Labs Reviewed  CBC WITH DIFFERENTIAL/PLATELET - Abnormal; Notable for the following:    Neutrophils Relative % 41 (*)    Eosinophils Relative 8 (*)    All other components within normal limits  COMPREHENSIVE METABOLIC PANEL - Abnormal; Notable for the following:    AST 47 (*)    ALT 65 (*)    All other components within normal limits  LIPASE, BLOOD - Abnormal; Notable for the following:    Lipase <10 (*)    All other components within normal limits  URINALYSIS, ROUTINE W REFLEX MICROSCOPIC - Abnormal; Notable for the following:    Ketones, ur 15 (*)    All other components within normal limits  TROPONIN I  POC URINE PREG, ED    Imaging Review Ct Angio Chest Pe W/cm &/or Wo Cm  10/10/2014   CLINICAL DATA:  Pain and of the left lower ribs increasing with movement and deep breathing. Nausea and vomiting. Smoker. History of HIV positive.  EXAM: CT ANGIOGRAPHY CHEST WITH CONTRAST  TECHNIQUE: Multidetector CT imaging of the chest was performed using the standard protocol during bolus administration of intravenous contrast. Multiplanar CT image reconstructions and MIPs were obtained to evaluate  the vascular anatomy.  CONTRAST:  OMNIPAQUE IOHEXOL 350 MG/ML SOLN  COMPARISON:  Abdomen series 46962  FINDINGS: Technically adequate study with good opacification of the central and segmental pulmonary arteries. No focal filling defects. No evidence of significant pulmonary embolus.  Normal heart size. Normal caliber thoracic aorta. Esophagus is decompressed. No significant lymphadenopathy in the chest.  Lungs are clear. No focal airspace disease or consolidation. No pleural effusions. No pneumothorax.  Included portions of the upper abdominal organs are grossly unremarkable. No destructive bone lesions.  Review of the MIP images confirms the above findings.  IMPRESSION: No evidence of significant pulmonary embolus. No evidence of active pulmonary disease.   Electronically Signed   By: Burman Nieves M.D.   On: 10/10/2014 02:43   Ct Abdomen Pelvis W Contrast  10/09/2014   CLINICAL DATA:  Abdominal pain under the left rib since yesterday. Pain worse when she lies down.  EXAM: CT ABDOMEN AND PELVIS WITH CONTRAST  TECHNIQUE: Multidetector CT imaging of the  abdomen and pelvis was performed using the standard protocol following bolus administration of intravenous contrast.  CONTRAST:  OMNIPAQUE IOHEXOL 300 MG/ML  SOLN  COMPARISON:  Abdominal series 5/11 /2016  FINDINGS: Lung bases are clear.  The liver, spleen, pancreas, adrenal glands, kidneys, abdominal aorta, inferior vena cava, and retroperitoneal lymph nodes are unremarkable. Surgical absence of the gallbladder. No bile duct dilatation. Stomach, small bowel, and colon are not abnormally distended. Contrast material flows to the terminal ileum without evidence of bowel obstruction. No free air or free fluid in the abdomen. Abdominal wall musculature appears intact.  Pelvis: Uterus and ovaries are not enlarged. Bladder wall is not thickened. No free or loculated pelvic fluid collections. No pelvic mass or lymphadenopathy. Appendix is normal. No  destructive bone lesions.  IMPRESSION: No acute process demonstrated in the abdomen or pelvis. No inflammatory changes. No evidence of bowel obstruction.   Electronically Signed   By: Burman Nieves M.D.   On: 10/09/2014 03:10   Dg Abd Acute W/chest  10/08/2014   CLINICAL DATA:  Left upper quadrant pain under anterior rib cage. Nausea vomiting. Shortness of breath. HIV. Smoker.  EXAM: DG ABDOMEN ACUTE W/ 1V CHEST  COMPARISON:  Chest radiograph of 02/13/2014. No prior abdominal imaging.  FINDINGS: Frontal view of the chest demonstrates midline trachea. Normal heart size and mediastinal contours. No pleural effusion or pneumothorax. Clear lungs.  Abdominal films demonstrate no free intraperitoneal air or significant air-fluid levels on upright positioning. No bowel distention on supine imaging. Cholecystectomy clips. Distal stool. No abnormal abdominal calcifications. No appendicolith.  IMPRESSION: No acute findings.   Electronically Signed   By: Jeronimo Greaves M.D.   On: 10/08/2014 14:14     EKG Interpretation   Date/Time:  Friday Oct 10 2014 01:45:02 EDT Ventricular Rate:  48 PR Interval:  164 QRS Duration: 87 QT Interval:  475 QTC Calculation: 424 R Axis:   84 Text Interpretation:  Sinus bradycardia Confirmed by [facility]  MD,  APRIL ([id number]) on 10/10/2014 2:02:53 AM      MDM   Final diagnoses:  Left upper quadrant pain  PUD (peptic ulcer disease)    34 year old female presents to the emergency department for persistent abdominal pain. Patient was seen and evaluated in the emergency department yesterday, on 10/09/2014. She had a negative CT abdomen pelvis at this time. Patient states that pain has persisted. She is afebrile. Her LFTs are slightly elevated today. They have doubled compared to her workup yesterday; however, patient's pain is in the left upper quadrant and she has no palpable right upper quadrant tenderness. White blood cell count improved. Patient is not anemic.  Electrolytes normal. Kidney function preserved.  Given location of patient's pain, CT chest pursued to evaluate for pulmonary embolus. Patient reported that her pain travels around to her left thoracic back and was worse with inspiration. CT chest is negative for pulmonary embolus or any other cardiopulmonary process. Given her hemodynamic stability, negative CT imaging yesterday, and reassuring workup today, I have a low suspicion for emergent cause of patient's pain. Will treat patient for peptic ulcer disease as an outpatient with protonic and Carafate. Patient referred to gastroenterology should her pain continue. Primary care follow up advised and return precautions given. Patient agreeable to plan with no unaddressed concerns. Patient discharged in good condition.   Filed Vitals:   10/09/14 2047 10/10/14 0138  BP: 116/74 120/71  Pulse: 59 53  Temp: 98 F (36.7 C)   TempSrc: Oral   Resp:  18 16  SpO2: 98% 98%     Antony MaduraKelly Riddick Nuon, PA-C 10/10/14 0421  Cy BlamerApril Palumbo, MD 10/10/14 [phone]

## 2014-10-14 ENCOUNTER — Encounter: Payer: Self-pay | Admitting: Infectious Disease

## 2014-10-14 ENCOUNTER — Encounter (INDEPENDENT_AMBULATORY_CARE_PROVIDER_SITE_OTHER): Payer: Medicare Other | Admitting: *Deleted

## 2014-10-14 VITALS — BP 110/70 | HR 71 | Temp 98.6°F | Resp 16 | Wt 127.0 lb

## 2014-10-14 DIAGNOSIS — Z006 Encounter for examination for normal comparison and control in clinical research program: Secondary | ICD-10-CM

## 2014-10-14 LAB — COMPREHENSIVE METABOLIC PANEL
ALBUMIN: 4.3 g/dL (ref 3.5–5.2)
ALT: 37 U/L — AB (ref 0–35)
AST: 17 U/L (ref 0–37)
Alkaline Phosphatase: 63 U/L (ref 39–117)
BUN: 7 mg/dL (ref 6–23)
CO2: 24 mEq/L (ref 19–32)
Calcium: 9.5 mg/dL (ref 8.4–10.5)
Chloride: 106 mEq/L (ref 96–112)
Creat: 0.86 mg/dL (ref 0.50–1.10)
Glucose, Bld: 138 mg/dL — ABNORMAL HIGH (ref 70–99)
Potassium: 4.9 mEq/L (ref 3.5–5.3)
SODIUM: 140 meq/L (ref 135–145)
TOTAL PROTEIN: 7 g/dL (ref 6.0–8.3)
Total Bilirubin: 0.5 mg/dL (ref 0.2–1.2)

## 2014-10-14 LAB — BILIRUBIN,DIRECT & INDIRECT (FRACTIONATED)
BILIRUBIN DIRECT: 0.1 mg/dL (ref 0.0–0.3)
BILIRUBIN INDIRECT: 0.4 mg/dL (ref 0.2–1.2)

## 2014-10-14 LAB — CD4/CD8 (T-HELPER/T-SUPPRESSOR CELL)
CD4%: 33
CD4%: 21.8
CD4: 495
CD4: 501
CD8 T CELL SUPPRESSOR: 45.7
CD8 T CELL SUPPRESSOR: 63.9
CD8: 686
CD8: 1470

## 2014-10-14 LAB — HIV-1 RNA QUANT-NO REFLEX-BLD
HIV-1 RNA Viral Load: 33955
HIV-1 RNA Viral Load: 52510

## 2014-10-14 NOTE — Progress Notes (Signed)
[name] is here for her week 4 study visit. She was recently seen a couple of times in the ED for severe abd. Pain. She said the pain started under her left ribs and went around to her back. It was accompanied by nausea and vomiting. They could not find any definite cause for the pain but did suspect peptic ulcer and gave her protonix and carafate which she never got filled. She said there is still some pain there but manageable (3 out of 10). Dr. Daiva EvesVan Dam did not feel the ARVs were causing the problem and she never stopped taking those. I did tell her if it persisted or started to get worse she needed to consult a GI provider. She will return for the next study appt in 4 weeks.

## 2014-11-03 ENCOUNTER — Encounter: Payer: Self-pay | Admitting: Infectious Disease

## 2014-11-11 ENCOUNTER — Encounter (INDEPENDENT_AMBULATORY_CARE_PROVIDER_SITE_OTHER): Payer: Self-pay | Admitting: *Deleted

## 2014-11-11 VITALS — BP 126/86 | HR 68 | Temp 98.4°F | Resp 16 | Wt 120.2 lb

## 2014-11-11 DIAGNOSIS — Z006 Encounter for examination for normal comparison and control in clinical research program: Secondary | ICD-10-CM

## 2014-11-11 NOTE — Progress Notes (Signed)
[name] is here for her week 8 [id number] study visit. She denies any new problems, but does say she is still having some mild abdominal pain. She is not taking anything for it though. She will return in 4 weeks for the next visit.

## 2014-11-24 ENCOUNTER — Emergency Department (HOSPITAL_COMMUNITY)
Admission: EM | Admit: 2014-11-24 | Discharge: 2014-11-24 | Disposition: A | Discharge status: Home / Self Care | Payer: Medicare Other | Attending: Emergency Medicine | Admitting: Emergency Medicine

## 2014-11-24 ENCOUNTER — Emergency Department (HOSPITAL_COMMUNITY): Payer: Medicare Other

## 2014-11-24 ENCOUNTER — Encounter (HOSPITAL_COMMUNITY): Payer: Self-pay | Admitting: Emergency Medicine

## 2014-11-24 DIAGNOSIS — R569 Unspecified convulsions: Secondary | ICD-10-CM | POA: Diagnosis present

## 2014-11-24 DIAGNOSIS — Z3202 Encounter for pregnancy test, result negative: Secondary | ICD-10-CM | POA: Diagnosis not present

## 2014-11-24 DIAGNOSIS — Z720 Tobacco use: Secondary | ICD-10-CM | POA: Diagnosis not present

## 2014-11-24 DIAGNOSIS — Z79899 Other long term (current) drug therapy: Secondary | ICD-10-CM | POA: Insufficient documentation

## 2014-11-24 DIAGNOSIS — R55 Syncope and collapse: Secondary | ICD-10-CM | POA: Insufficient documentation

## 2014-11-24 DIAGNOSIS — Z21 Asymptomatic human immunodeficiency virus [HIV] infection status: Secondary | ICD-10-CM | POA: Insufficient documentation

## 2014-11-24 DIAGNOSIS — Z8719 Personal history of other diseases of the digestive system: Secondary | ICD-10-CM | POA: Diagnosis not present

## 2014-11-24 DIAGNOSIS — F1210 Cannabis abuse, uncomplicated: Secondary | ICD-10-CM | POA: Diagnosis not present

## 2014-11-24 LAB — URINALYSIS, ROUTINE W REFLEX MICROSCOPIC
Bilirubin Urine: NEGATIVE
GLUCOSE, UA: 500 mg/dL — AB
Hgb urine dipstick: NEGATIVE
KETONES UR: NEGATIVE mg/dL
Leukocytes, UA: NEGATIVE
Nitrite: NEGATIVE
Protein, ur: NEGATIVE mg/dL
Specific Gravity, Urine: 1.02 (ref 1.005–1.030)
UROBILINOGEN UA: 0.2 mg/dL (ref 0.0–1.0)
pH: 5 (ref 5.0–8.0)

## 2014-11-24 LAB — CBC WITH DIFFERENTIAL/PLATELET
BASOS ABS: 0 10*3/uL (ref 0.0–0.1)
BASOS PCT: 0 % (ref 0–1)
Eosinophils Absolute: 0 10*3/uL (ref 0.0–0.7)
Eosinophils Relative: 0 % (ref 0–5)
HEMATOCRIT: 38.9 % (ref 36.0–46.0)
Hemoglobin: 12.7 g/dL (ref 12.0–15.0)
LYMPHS PCT: 16 % (ref 12–46)
Lymphs Abs: 1.6 10*3/uL (ref 0.7–4.0)
MCH: 29.8 pg (ref 26.0–34.0)
MCHC: 32.6 g/dL (ref 30.0–36.0)
MCV: 91.3 fL (ref 78.0–100.0)
Monocytes Absolute: 0.5 10*3/uL (ref 0.1–1.0)
Monocytes Relative: 5 % (ref 3–12)
Neutro Abs: 7.4 10*3/uL (ref 1.7–7.7)
Neutrophils Relative %: 79 % — ABNORMAL HIGH (ref 43–77)
PLATELETS: 183 10*3/uL (ref 150–400)
RBC: 4.26 MIL/uL (ref 3.87–5.11)
RDW: 13.9 % (ref 11.5–15.5)
WBC: 9.5 10*3/uL (ref 4.0–10.5)

## 2014-11-24 LAB — COMPREHENSIVE METABOLIC PANEL
ALBUMIN: 3.9 g/dL (ref 3.5–5.0)
ALT: 9 U/L — AB (ref 14–54)
AST: 17 U/L (ref 15–41)
Alkaline Phosphatase: 65 U/L (ref 38–126)
Anion gap: 10 (ref 5–15)
BILIRUBIN TOTAL: 0.5 mg/dL (ref 0.3–1.2)
BUN: 10 mg/dL (ref 6–20)
CHLORIDE: 106 mmol/L (ref 101–111)
CO2: 21 mmol/L — ABNORMAL LOW (ref 22–32)
Calcium: 8.9 mg/dL (ref 8.9–10.3)
Creatinine, Ser: 0.72 mg/dL (ref 0.44–1.00)
GFR calc Af Amer: >60 mL/min (ref >60)
GFR calc non Af Amer: >60 mL/min (ref >60)
Glucose, Bld: 150 mg/dL — ABNORMAL HIGH (ref 65–99)
POTASSIUM: 4 mmol/L (ref 3.5–5.1)
SODIUM: 137 mmol/L (ref 135–145)
Total Protein: 6.8 g/dL (ref 6.5–8.1)

## 2014-11-24 LAB — RAPID URINE DRUG SCREEN, HOSP PERFORMED
Amphetamines: NOT DETECTED
BENZODIAZEPINES: NOT DETECTED
Barbiturates: NOT DETECTED
Cocaine: NOT DETECTED
Opiates: NOT DETECTED
TETRAHYDROCANNABINOL: POSITIVE — AB

## 2014-11-24 LAB — POC URINE PREG, ED: PREG TEST UR: NEGATIVE

## 2014-11-24 MED ORDER — METOCLOPRAMIDE HCL 5 MG/ML IJ SOLN
10.0000 mg | Freq: Once | INTRAMUSCULAR | Status: AC
  Administered 2014-11-24: 10 mg via INTRAVENOUS
  Filled 2014-11-24: qty 2

## 2014-11-24 MED ORDER — SODIUM CHLORIDE 0.9 % IV BOLUS (SEPSIS)
1000.0000 mL | Freq: Once | INTRAVENOUS | Status: AC
  Administered 2014-11-24: 1000 mL via INTRAVENOUS

## 2014-11-24 MED ORDER — HYDROCODONE-ACETAMINOPHEN 5-325 MG PO TABS
1.0000 | ORAL_TABLET | Freq: Once | ORAL | Status: AC
  Administered 2014-11-24: 1 via ORAL
  Filled 2014-11-24: qty 1

## 2014-11-24 MED ORDER — KETOROLAC TROMETHAMINE 30 MG/ML IJ SOLN
30.0000 mg | Freq: Once | INTRAMUSCULAR | Status: AC
  Administered 2014-11-24: 30 mg via INTRAVENOUS
  Filled 2014-11-24: qty 1

## 2014-11-24 NOTE — ED Notes (Signed)
Bed: [id number] Expected date:  Expected time:  Means of arrival:  Comments: EMS - Seizure, hx of same

## 2014-11-24 NOTE — Discharge Instructions (Signed)
Return to the emergency room with worsening of symptoms, new symptoms or with symptoms that are concerning , especially severe worsening of headache, visual or speech changes, weakness in face, arms or legs. Please call your doctor for a followup appointment within 24-48 hours. When you talk to your doctor please let them know that you were seen in the emergency department and have them acquire all of your records so that they can discuss the findings with you and formulate a treatment plan to fully care for your new and ongoing problems. If you do not have a primary care provider please call the number below under ED resources to establish care with a provider and follow up.   Syncope Syncope is a medical term for fainting or passing out. This means you lose consciousness and drop to the ground. People are generally unconscious for less than 5 minutes. You may have some muscle twitches for up to 15 seconds before waking up and returning to normal. Syncope occurs more often in older adults, but it can happen to anyone. While most causes of syncope are not dangerous, syncope can be a sign of a serious medical problem. It is important to seek medical care.  CAUSES  Syncope is caused by a sudden drop in blood flow to the brain. The specific cause is often not determined. Factors that can bring on syncope include:  Taking medicines that lower blood pressure.  Sudden changes in posture, such as standing up quickly.  Taking more medicine than prescribed.  Standing in one place for too long.  Seizure disorders.  Dehydration and excessive exposure to heat.  Low blood sugar (hypoglycemia).  Straining to have a bowel movement.  Heart disease, irregular heartbeat, or other circulatory problems.  Fear, emotional distress, seeing blood, or severe pain. SYMPTOMS  Right before fainting, you may:  Feel dizzy or light-headed.  Feel nauseous.  See all white or all black in your field of  vision.  Have cold, clammy skin. DIAGNOSIS  Your health care provider will ask about your symptoms, perform a physical exam, and perform an electrocardiogram (ECG) to record the electrical activity of your heart. Your health care provider may also perform other heart or blood tests to determine the cause of your syncope which may include:  Transthoracic echocardiogram (TTE). During echocardiography, sound waves are used to evaluate how blood flows through your heart.  Transesophageal echocardiogram (TEE).  Cardiac monitoring. This allows your health care provider to monitor your heart rate and rhythm in real time.  Holter monitor. This is a portable device that records your heartbeat and can help diagnose heart arrhythmias. It allows your health care provider to track your heart activity for several days, if needed.  Stress tests by exercise or by giving medicine that makes the heart beat faster. TREATMENT  In most cases, no treatment is needed. Depending on the cause of your syncope, your health care provider may recommend changing or stopping some of your medicines. HOME CARE INSTRUCTIONS  Have someone stay with you until you feel stable.  Do not drive, use machinery, or play sports until your health care provider says it is okay.  Keep all follow-up appointments as directed by your health care provider.  Lie down right away if you start feeling like you might faint. Breathe deeply and steadily. Wait until all the symptoms have passed.  Drink enough fluids to keep your urine clear or pale yellow.  If you are taking blood pressure or heart medicine, get  up slowly and take several minutes to sit and then stand. This can reduce dizziness. SEEK IMMEDIATE MEDICAL CARE IF:   You have a severe headache.  You have unusual pain in the chest, abdomen, or back.  You are bleeding from your mouth or rectum, or you have black or tarry stool.  You have an irregular or very fast  heartbeat.  You have pain with breathing.  You have repeated fainting or seizure-like jerking during an episode.  You faint when sitting or lying down.  You have confusion.  You have trouble walking.  You have severe weakness.  You have vision problems. If you fainted, call your local emergency services (911 in U.[place] Do not drive yourself to the hospital.  MAKE SURE YOU:  Understand these instructions.  Will watch your condition.  Will get help right away if you are not doing well or get worse. Document Released: 05/16/2005 Document Revised: 05/21/2013 Document Reviewed: 07/15/2011 [facility] Patient Information 2015 [place] [place]. This information is not intended to replace advice given to you by your health care provider. Make sure you discuss any questions you have with your health care provider.  Emergency Department Resource Guide 1) Find a Doctor and Pay Out of Pocket Although you won't have to find out who is covered by your insurance plan, it is a good idea to ask around and get recommendations. You will then need to call the office and see if the doctor you have chosen will accept you as a new patient and what types of options they offer for patients who are self-pay. Some doctors offer discounts or will set up payment plans for their patients who do not have insurance, but you will need to ask so you aren't surprised when you get to your appointment.  2) Contact Your Local Health Department Not all health departments have doctors that can see patients for sick visits, but many do, so it is worth a call to see if yours does. If you don't know where your local health department is, you can check in your phone book. The CDC also has a tool to help you locate your state's health department, and many state websites also have listings of all of their local health departments.  3) Find a Walk-in Clinic If your illness is not likely to be very severe or complicated, you may want  to try a walk in clinic. These are popping up all over the country in pharmacies, drugstores, and shopping centers. They're usually staffed by nurse practitioners or physician assistants that have been trained to treat common illnesses and complaints. They're usually fairly quick and inexpensive. However, if you have serious medical issues or chronic medical problems, these are probably not your best option.  No Primary Care Doctor: - Call Health Connect at  [phone] - they can help you locate a primary care doctor that  accepts your insurance, provides certain services, etc. - Physician Referral Service- [phone]  Chronic Pain Problems: Organization         Address  Phone   Notes  [facility]  [phone] Patients need to be referred by their primary care doctor.   Medication Assistance: Organization         Address  Phone   Notes  [facility] [address] [place], [place] [phone] --Must be a resident of [place] -- Must have NO insurance coverage whatsoever (no Medicaid/ Medicare, etc.) -- The  pt. MUST have a primary care doctor that directs their care regularly and follows them in the community   [facility]  [phone]   [employer]  [phone]    Agencies that provide inexpensive medical care: Organization         Address  Phone   Notes  [facility]  [phone]   [facility]    [phone]   [facility] [address] [place], [place] [address] [phone]   [facility] [address] [place] [place] [phone]   Planned Parenthood    [phone]   [facility]    [phone]   Community [facility] and [facility]  [address], [place] Phone:  [phone], Fax:  [phone] Hours of Operation:  9 am - 6 pm, M-F.  Also accepts Medicaid/Medicare and self-pay.   [facility]  [address], [place] Phone: [phone], Fax: [phone]. Hours of Operation:  8:30 am - 5:30 pm, M-F.  Also accepts Medicaid and self-pay.  [facility] [place] [address], [place] Phone: [phone]   [facility] [address] Natasha Bence [place], [place] [phone], Ext. [phone] Mondays & Thursdays: 7-9 AM.  First 15 patients are seen on a first come, first serve basis.    Medicaid-accepting [facility] Providers:  Organization         Address  Phone   Notes  [facility] [address], [place] [phone] Also accepts self-pay patients.  [facility] [address] Laurell Josephs [place], [place]  [phone]   [facility] [address], [place] [phone]   [facility] [address], [place] [phone]   Renaye Rakers [address], [place]   [phone] Only accepts [place] Access [place] patients after they have their name applied to their card.   Self-Pay (no insurance) in [facility]:  Organization         Address  Phone   Notes  Sickle Cell Patients, [facility] [address] [place], [place] [phone]   [facility] [address] [place], [place] [phone]   [facility] [place]  [address], [place] [phone]   [facility]/Dr. Osei-Bonsu  [address], [place] or [address], [place] [phone] Phone number for both [place] and [place] locations is the same.  [facility] and [facility] [address] [place] [phone]   [facility] [address], [place] or [address] [phone]   [facility] [address], [place] [phone], phone; [phone], fax Sees patients 1st and 3rd Saturday of every month.  Must not qualify for public or private insurance (i.e. Medicaid, Medicare, [place] Health Choice, Veterans' Benefits)  Household income should be no more than 200% of the poverty level The clinic cannot treat you if you are pregnant or think you are pregnant  Sexually transmitted diseases are not treated at the clinic.    Dental Care: Organization         Address  Phone  Notes  [facility] Department of Public Health [facility] [address]  Joellyn Quails, [place] [phone] Accepts children up to age 55 who are enrolled in Medicaid or [place] Health Choice; pregnant women with a Medicaid card; and children who have applied for Medicaid or [place] Health Choice, but were declined, whose parents can pay a reduced fee at time of service.  [facility] Department of [facility]  [address], [place] [phone] Accepts children up to age 66 who are enrolled in [place] or [place] Health Choice; pregnant women with a Medicaid card; and children who have applied for Medicaid or  Health Choice, but were declined, whose parents can pay a reduced fee at time of service.  [facility]  [address] [place], [place] [phone] Patients are seen by appointment only. Walk-ins are not accepted. [facility] will see patients 97 years of age and older. Monday - Tuesday (8am-5pm) Most Wednesdays (8:30-5pm) $30 per visit, cash only  [facility]  [address], [facility] [phone] Patients are seen by appointment only. Walk-ins are not accepted. [facility] will see patients 28 years of age and older. One Wednesday Evening (Monthly: Volunteer Based).  $30 per visit, cash only  [employer]  [phone] for adults; Children under age 91, call Graduate Pediatric Dentistry at [phone]. Children aged 7-14, please call [phone] to request a pediatric application.  Dental services are provided in all areas of dental care including fillings, crowns and bridges, complete and partial dentures, implants, gum treatment, root canals, and extractions. Preventive care is also provided. Treatment is provided to both adults and children. Patients are selected via a lottery and there is often a waiting list.   [facility] [address] [place]  [phone] [email or web address]   [facility] [address] [place], [place] [phone], Ext. [phone] Second and Fourth Thursday of each month, opens at 6:30 AM; Clinic ends at 9 AM.  Patients are seen on a first-come first-served basis, and a limited number are seen during each clinic.   [facility]  [address] Ether Griffins [place], [place] [phone]   Eligibility Requirements You must have lived in [place], [place], or [place] counties for at least the last three months.   You cannot be eligible for state or federal sponsored [place], including [employer], [place], or [employer].   You generally cannot be eligible for healthcare insurance through your employer.    How to apply: Eligibility screenings are held every Tuesday and Wednesday afternoon from 1:00 pm until 4:00 pm. You do not need an appointment for the interview!  [facility] [address], [place], [place] [phone]   [facility] Health Department  [phone]   [facility] Health Department  [phone]   [facility] Health Department  [phone]    Behavioral Health Resources in the Community: Intensive Outpatient Programs Organization         Address  Phone  Notes  [facility] Services [address] [place], [place] [phone]   [facility] Outpatient [address], [place], [place] [phone]   [facility] [address] [place], [place]  [phone]    [facility] [address],  [place], [place] [phone] or [phone]   Substance Abuse Resources Organization         Address  Phone  Notes  Alcohol and  Drug Services  [phone]   [facility]  [phone]   [facility] [place]  6674654189   Floydene Flock  [phone]   Residential & Outpatient Substance Abuse Program  [phone]   Psychological Services Organization         Address  Phone  Notes  [facility] Health  [phone]   [facility]  [phone]   [facility] [address] [place] [phone] or [phone]    Mobile Crisis Teams Organization         Address  Phone  Notes  [facility], Mobile Crisis Care Unit  [phone]   [facility]  [address] [place], [place] [phone]   Doristine Locks [address] [place] [phone]    Self-Help/Support Groups Organization         Address  Phone             Notes  [facility] [place] - variety of support groups  336- I7437963 Call for more information  Narcotics Anonymous (NA), [facility] [address], [employer] [place]  2 meetings at this location   Statistician         Address  Phone  Notes  [facility] [address],    [place]  [phone]   [facility]  [address], [place] [address], [place], [place] [phone]   [facility] [address] [place], [place] [phone] Admissions: 8am-3pm M-F  [facility] [address],    [place], [place] [phone]   [facility] [address] [place] [address] [place], [place], [place] [phone]   [facility] [address]  [place], [place] [phone]   [facility] - Intensive Outpatient [address] Laurell Josephs [address], [place], [place] [phone]   [facility] ([facility] [address]  [place], [place] [phone] or [phone]   [facility] ([facility]) [address] [place], [place] [phone] Accepts Medicaid  [facility] [place] [address] [place] [address],  [place] [phone] Substance Abuse/Addiction Treatment   [facility] Organization         Address  Phone  Notes  [facility]  [phone]   Angie Fava, PhD [address] Ervin Knack [place], [place]   [phone] or [phone]   [facility]   [address] [place], [place] [phone]   [facility] [address], [place], [place] [phone] Insurance/Medicaid/sponsorship through [facility] and Families [address]                                    [place], [place] [phone] Therapy/tele-psych/case  [facility] [address], [place] [phone]    Dr. Lolly Mustache  [phone]   [facility] [place]  [employer] [facility] Dept. 1) [address], [place] 2) [address], [place] 3)  [address], [place] [phone]  [phone]   [facility] Child Abuse Hotline [phone] or [phone] (After Hours)

## 2014-11-24 NOTE — ED Provider Notes (Signed)
CSN: [id number]     Arrival date & time 11/24/14  1928 History   First MD Initiated Contact with Patient 11/24/14 1935     Chief Complaint  Patient presents with  . Seizures     (Consider location/radiation/quality/duration/timing/severity/associated sxs/prior Treatment) HPI  [name] is a 34 y.o. female with PMH of seizures, HIV with undetectable viral load presenting with syncopal episode. Episode witnessed by friend who is bedside. He reported patient had 15 minutes of loss of consciousness with generalized body shaking and patient's head repeatedly hitting a wall. Patient with closed eyes. When she came to she was confused to person and event as well as place. No loss of control of bladder or bowel. No tongue biting. Patient with history of similar episodes has not followed up with neurology to requiring primary care referral. Pt denies chest pain, SOB, nausea, vomiting, abdominal pain back pain. No visual changes. Pt with headache that developed gradually diffuse and throbbing. No weakness.   Past Medical History  Diagnosis Date  . HIV infection   . Abdominal pain, left upper quadrant 10/08/2014   Past Surgical History  Procedure Laterality Date  . Cholecystectomy    . Fracture surgery     Family History  Problem Relation Age of Onset  . Diabetes Maternal Grandfather    History  Substance Use Topics  . Smoking status: Current Every Day Smoker -- 20 years  . Smokeless tobacco: Never Used  . Alcohol Use: 0.0 oz/week    0 Standard drinks or equivalent per week     Comment: twice a year    OB History    No data available     Review of Systems 10 Systems reviewed and are negative for acute change except as noted in the HPI.    Allergies  Review of patient's allergies indicates no known allergies.  Home Medications   Prior to Admission medications   Medication Sig Start Date End Date Taking? Authorizing Provider  buprenorphine (SUBUTEX) 8 MG SUBL SL tablet  Place 4 mg under the tongue daily.    Yes Historical Provider, MD  dolutegravir (TIVICAY) 50 MG tablet Take 1 tablet (50 mg total) by mouth daily. 10/08/14  Yes Randall Hiss, MD  lamivudine (EPIVIR) 300 MG tablet Take 1 tablet (300 mg total) by mouth daily. 10/08/14  Yes Randall Hiss, MD  pantoprazole (PROTONIX) 20 MG tablet Take 1 tablet (20 mg total) by mouth daily. Patient not taking: Reported on 11/24/2014 10/10/14   Antony Madura, PA-C  sucralfate (CARAFATE) 1 GM/10ML suspension Take 10 mLs (1 g total) by mouth 4 (four) times daily -  with meals and at bedtime. Patient not taking: Reported on 11/24/2014 10/10/14   Antony Madura, PA-C  traMADol (ULTRAM) 50 MG tablet Take 1 tablet (50 mg total) by mouth every 6 (six) hours as needed. Patient not taking: Reported on 11/24/2014 10/09/14   Dione Booze, MD   BP 103/57 mmHg  Pulse 77  Temp(Src) 98.4 F (36.9 C) (Oral)  Resp 12  Ht  (1.676 m)  Wt 120 lb (54.432 kg)  BMI 19.38 kg/m2  SpO2 98% Physical Exam  Constitutional: She appears well-developed and well-nourished. No distress.  HENT:  Head: Normocephalic and atraumatic.  Mouth/Throat: Oropharynx is clear and moist.  Eyes: Conjunctivae and EOM are normal. Pupils are equal, round, and reactive to light. Right eye exhibits no discharge. Left eye exhibits no discharge.  Neck: Normal range of motion. Neck supple.  No nuchal rigidity  Cardiovascular: Normal rate and regular rhythm.   Pulmonary/Chest: Effort normal and breath sounds normal. No respiratory distress. She has no wheezes.  Abdominal: Soft. Bowel sounds are normal. She exhibits no distension. There is no tenderness.  Neurological: She is alert. No cranial nerve deficit. Coordination normal.  Speech is clear and goal oriented. Peripheral visual fields intact. Strength 5/5 in upper and lower extremities. Sensation intact. Intact rapid alternating movements, finger to nose, and heel to shin. Negative Romberg. No pronator  drift. Normal gait.   Skin: Skin is warm and dry. She is not diaphoretic.  Nursing note and vitals reviewed.   ED Course  Procedures (including critical care time) Labs Review Labs Reviewed  CBC WITH DIFFERENTIAL/PLATELET - Abnormal; Notable for the following:    Neutrophils Relative % 79 (*)    All other components within normal limits  COMPREHENSIVE METABOLIC PANEL - Abnormal; Notable for the following:    CO2 21 (*)    Glucose, Bld 150 (*)    ALT 9 (*)    All other components within normal limits  URINE RAPID DRUG SCREEN, HOSP PERFORMED - Abnormal; Notable for the following:    Tetrahydrocannabinol POSITIVE (*)    All other components within normal limits  URINALYSIS, ROUTINE W REFLEX MICROSCOPIC (NOT AT [facility]) - Abnormal; Notable for the following:    Glucose, UA 500 (*)    All other components within normal limits  POC URINE PREG, ED    Imaging Review Ct Head Wo Contrast  11/24/2014   CLINICAL DATA:  Seizure, headache, right-sided facial contusion.  EXAM: CT HEAD WITHOUT CONTRAST  CT MAXILLOFACIAL WITHOUT CONTRAST  TECHNIQUE: Multidetector CT imaging of the head and maxillofacial structures were performed using the standard protocol without intravenous contrast. Multiplanar CT image reconstructions of the maxillofacial structures were also generated.  COMPARISON:  CT scan of March 09, 2014.  FINDINGS: CT HEAD FINDINGS  Bony calvarium appears intact. No mass effect or midline shift is noted. Ventricular size is within normal limits. There is no evidence of mass lesion, hemorrhage or acute infarction.  CT MAXILLOFACIAL FINDINGS  No fracture or other bony abnormality is noted. Right maxillary sinusitis is noted. Pterygoid plates appear normal. Globes and orbits appear normal.  IMPRESSION: Normal head CT.  Right maxillary sinusitis. No other abnormality seen in maxillofacial region.   Electronically Signed   By: Lupita RaiderJames  Green Jr, M.D.   On: 11/24/2014 21:16   Ct Maxillofacial Wo  Cm  11/24/2014   CLINICAL DATA:  Seizure, headache, right-sided facial contusion.  EXAM: CT HEAD WITHOUT CONTRAST  CT MAXILLOFACIAL WITHOUT CONTRAST  TECHNIQUE: Multidetector CT imaging of the head and maxillofacial structures were performed using the standard protocol without intravenous contrast. Multiplanar CT image reconstructions of the maxillofacial structures were also generated.  COMPARISON:  CT scan of March 09, 2014.  FINDINGS: CT HEAD FINDINGS  Bony calvarium appears intact. No mass effect or midline shift is noted. Ventricular size is within normal limits. There is no evidence of mass lesion, hemorrhage or acute infarction.  CT MAXILLOFACIAL FINDINGS  No fracture or other bony abnormality is noted. Right maxillary sinusitis is noted. Pterygoid plates appear normal. Globes and orbits appear normal.  IMPRESSION: Normal head CT.  Right maxillary sinusitis. No other abnormality seen in maxillofacial region.   Electronically Signed   By: Lupita RaiderJames  Green Jr, M.D.   On: 11/24/2014 21:16     EKG Interpretation None      ED ECG REPORT  Date: 11/24/2014  Rate: 84   Rhythm: normal sinus rhythm  QRS Axis: normal  Intervals: normal  ST/T Wave abnormalities: normal  Conduction Disutrbances:none  Narrative Interpretation: sinus rhythm   MDM   Final diagnoses:  Atypical syncope   Pt presenting with atypical syncopal episode with history of seizures. VSS. Normal neurological exam. Lab work up reassuring. No acute abnormalities on head CT and maxillofacial CT. Pt presentation atypical for seizures. However we have discussed seizure precautions and close follow up with neurology. Pt reports significant improvement of headache in ED. I doubt SAH, ICH or meningitis. Pt also given referral to [facility] and ED resources. Patient is afebrile, nontoxic, and in no acute distress. Patient is appropriate for outpatient management and is stable for discharge.  Discussed return precautions with  patient. Discussed all results and patient verbalizes understanding and agrees with plan.  Case has been discussed with Dr. Freida Busman who agrees with the above plan and to discharge.      Oswaldo Conroy, PA-C 11/25/14 1409  Lorre Nick, MD 11/25/14 [phone]

## 2014-11-24 NOTE — ED Notes (Addendum)
Pt presents from home via EMS for seizure. Hx of seizures. No incontinence, bit tongue. No seizure prescriptions.   Last VS: 166CBG, 112/70, 90HR, 18Resp, 97%RA  20G left forearm

## 2014-12-03 ENCOUNTER — Telehealth: Payer: Self-pay | Admitting: Neurology

## 2014-12-03 ENCOUNTER — Encounter: Payer: Self-pay | Admitting: Neurology

## 2014-12-03 ENCOUNTER — Ambulatory Visit (INDEPENDENT_AMBULATORY_CARE_PROVIDER_SITE_OTHER): Payer: Medicare Other | Admitting: Neurology

## 2014-12-03 VITALS — BP 131/94 | HR 84 | Temp 98.0°F | Ht 66.0 in | Wt 119.8 lb

## 2014-12-03 DIAGNOSIS — R569 Unspecified convulsions: Secondary | ICD-10-CM | POA: Insufficient documentation

## 2014-12-03 NOTE — Progress Notes (Addendum)
[id number]    Provider:  Dr Lucia Gaskins Referring Provider: Daiva Eves, Lisette Grinder, MD Primary Care Physician:  Acey Lav, MD  CC:  seizures  HPI:  [name] is a 34 y.o. female here as a referral from Dr. Daiva Eves for seizures.  She has a long history of polysubstance abuse and mental Health history of depression, anxiety disorder, PTSD, agoraphobia and Schizoaffective disorder per patient. She is HIV positive. She reports multiple seizure-like events. Her first episode was October of last year. She was driving and then the next thing she knew, she was in the back of an ambulance. She has no idea what happened. Then at Thanksgiving it happened again, she was sitting there watching TV and then she found herself on the floor. She is a very poor historian. She doesn't know what happens, she just loses consciousness. Her husband says she had another episode march 11th of this year, he was in the shower and he heard a noise, when he came out she was shaking and her eyes were closed, lasted at least 10 minutes. Described by husband as shaking, she starts convulsing and smacking her head on the floor. Her head is going side to side like she is shaking her head "no -no". She is confused afterwards, she doesn't know anything for 20 minutes, doesn't even know the year. She is barely breathing during the events which can last longer than even 15 minutes. She has bitten her tongue twice. No urination. She cannot follow commands during the event. She cannot answer questions during the event. She denies any current use of any illegal substance other than marijuana and no alcohol use or withdrawal. She reports smacking her head on the floor in a "no-no" pattern. She is under a lot stress, she is having marital problems. No inciting factors, no head trauma. No aura. No headache. She is confused after every episode and is also tired. No FHx of seizures or seizures as a child. No focal  neurologic deficits.   Reviewed notes, labs and imaging from outside physicians, which showed:   Recent cbc and cmp unremarkable. Urine drug screen +THC. HIV RNA Viral Load < 40.    CT 11/24/2014: showed No acute intracranial abnormalities including mass lesion or mass effect, hydrocephalus, extra-axial fluid collection, midline shift, hemorrhage, or acute infarction, large ischemic events (personally reviewed images)  6/27: EMS sent to home for seizure. No tongue biting or urination. Per friend, 15 minute LOC with generalized body shaking and patient's head repeatedly hitting a wall. Patient with closed eyes. When she came to she was confused to person and event as well as place. No loss of control of bladder or bowel. No tongue biting. Reviewed notes back to 2013 and do not see any other ED visits for seizure-like activity.    Review of Systems: Patient complains of symptoms per HPI as well as the following symptoms: Palpitations, feeling hot, feeling cold, flushing, trouble swallowing, memory loss, confusion, headache, difficulty swallowing, seizure, passing out, depression, anxiety. Pertinent negatives per HPI. All others negative.   History   Social History  . Marital Status: Married    Spouse Name: Jonny Ruiz  . Number of Children: 2  . Years of Education: 12+   Occupational History  . Unemployed    Social History Main Topics  . Smoking status: Current Every Day Smoker -- 1.00 packs/day for 20 years    Types: Cigarettes  . Smokeless tobacco: Never Used  . Alcohol Use:  No  . Drug Use: Yes    Special: Marijuana, Heroin  . Sexual Activity: Not on file   Other Topics Concern  . Not on file   Social History Narrative   Lives at home with kids   Caffeine use: Drinks coffee (1 cup per day)    Family History  Problem Relation Age of Onset  . Diabetes Maternal Grandfather     Past Medical History  Diagnosis Date  . HIV infection   . Abdominal pain, left upper quadrant  10/08/2014    Past Surgical History  Procedure Laterality Date  . Cholecystectomy    . Fracture surgery      2-right hand  . Ureter surgery      Placement and removal     Current Outpatient Prescriptions  Medication Sig Dispense Refill  . buprenorphine (SUBUTEX) 8 MG SUBL SL tablet Place 4 mg under the tongue daily. 4x/day    . dolutegravir (TIVICAY) 50 MG tablet Take 1 tablet (50 mg total) by mouth daily. 30 tablet 11  . lamivudine (EPIVIR) 300 MG tablet Take 1 tablet (300 mg total) by mouth daily. 30 tablet 11  . TRAZODONE HCL PO Take 200 mg by mouth as needed.     No current facility-administered medications for this visit.    Allergies as of 12/03/2014  . (No Known Allergies)    Vitals: BP 131/94 mmHg  Pulse 84  Temp(Src) 98 F (36.7 C) (Oral)  Ht 5\' 6"  (1.676 m)  Wt 119 lb 12.8 oz (54.341 kg)  BMI 19.35 kg/m2 Last Weight:  Wt Readings from Last 1 Encounters:  12/03/14 119 lb 12.8 oz (54.341 kg)   Last Height:   Ht Readings from Last 1 Encounters:  12/03/14 5\' 6"  (1.676 m)    Physical exam: Exam: Gen: NAD, conversant, well nourised, thin, well groomed                     CV: RRR, no MRG. No Carotid Bruits. No peripheral edema, warm, nontender Eyes: Conjunctivae clear without exudates or hemorrhage  Neuro: Detailed Neurologic Exam  Speech:    Speech is normal; fluent and spontaneous with normal comprehension.  Cognition:    The patient is oriented to person, place, and time;     recent and remote memory intact;     language fluent;     normal attention, concentration,     fund of knowledge Cranial Nerves:    The pupils are equal, round, and reactive to light. The fundi are flat. Visual fields are full to finger confrontation. Extraocular movements are intact. Trigeminal sensation is intact and the muscles of mastication are normal. The face is symmetric. The palate elevates in the midline. Hearing intact. Voice is normal. Shoulder shrug is normal. The  tongue has normal motion without fasciculations.   Coordination:    Normal finger to nose and heel to shin. Normal rapid alternating movements.   Gait:    Heel-toe and tandem gait are normal.   Motor Observation:    No asymmetry, no atrophy, and no involuntary movements noted. Tone:    Normal muscle tone.    Posture:    Posture is normal. normal erect    Strength:    Strength is V/V in the upper and lower limbs.      Sensation: intact to LT     Reflex Exam:  DTR's:    Deep tendon reflexes in the upper and lower extremities are symmetrical bilaterally.  Toes:    The toes are downgoing bilaterally.   Clonus:    Clonus is absent.      Assessment/Plan:   34 y.o. female here as a referral from Dr. Daiva Eves for seizures.  She has a long history of polysubstance abuse and mental Health history of depression, anxiety disorder, PTSD, agoraphobia and Schizoaffective disorder per patient. She is HIV positive. She reports multiple  for seizure-like events. Some of the features are not consistent with seizures such as length of 15 minutes or more, shaking her head in a "no-no "pattern, repeatedly banging her head on the wall. We'll not prescribe antiepileptics at this time. We'll order a seizure workup including MRI of the brain and EEG. If EEG is unremarkable we'll consider long-term monitoring.  Will order EEG MRi of the brain w/wo contrast   No driving until 6 months seizure free, no bathing or swimming alone or anything that could hurt patient or others should he have a seizure. Patient states that she has been told this before an dunderstands.   Naomie Dean, MD  [facility] [address] [place], [place] [address]  Phone [phone] Fax [phone]

## 2014-12-03 NOTE — Telephone Encounter (Signed)
[name] called and stated that someone from our office called requesting the NPI number ([id number]) and she states they will authorize it for 6 visits. No need to return call unless you have questions.

## 2014-12-03 NOTE — Telephone Encounter (Addendum)
Called  Sadie and [facility] [place] [facility]. Sadie in not aware who she spoke she wanted relayed that patient was covered by her insurance for six office visits. I relayed to Sadie I will forward to Angie in billing . Patient was seen today.

## 2014-12-03 NOTE — Patient Instructions (Signed)
Overall you are doing fairly well but I do want to suggest a few things today:   Remember to drink plenty of fluid, eat healthy meals and do not skip any meals. Try to eat protein with a every meal and eat a healthy snack such as fruit or nuts in between meals. Try to keep a regular sleep-wake schedule and try to exercise daily, particularly in the form of walking, 20-30 minutes a day, if you can.   As far as your medications are concerned, I would like to suggest: none at this time  As far as diagnostic testing: MRi of the brain, EEG  I would like to see you back in 3 months, sooner if we need to. Please call us with any interim questions, concerns, problems, updates or refill requests.   Please also call us for any test results so we can go over those with you on the phone.  My clinical assistant and will answer any of your questions and relay your messages to me and also relay most of my messages to you.   Our phone number is [phone]. We also have an after hours call service for urgent matters and there is a physician on-call for urgent questions. For any emergencies you know to call 911 or go to the nearest emergency room

## 2014-12-05 ENCOUNTER — Encounter: Payer: Self-pay | Admitting: Infectious Disease

## 2014-12-05 ENCOUNTER — Ambulatory Visit (INDEPENDENT_AMBULATORY_CARE_PROVIDER_SITE_OTHER): Payer: Medicare Other | Admitting: Neurology

## 2014-12-05 DIAGNOSIS — R569 Unspecified convulsions: Secondary | ICD-10-CM

## 2014-12-05 LAB — HIV-1 RNA QUANT-NO REFLEX-BLD: HIV-1 RNA Viral Load: <40

## 2014-12-05 NOTE — Procedures (Signed)
     History: [name] is a 34 year old patient with a history of polysubstance abuse, and a history of HIV infection. The patient has had episodes of loss of consciousness on 4 occasions. The patient is being evaluated for seizure-type events.  This is a routine EEG. No skull defects are noted. Medications include Subutex, Tivicay, Epivir, and trazodone.  EEG classification: Dysrhythmia grade 2 left temporal  Description of the recording: The background rhythms of this recording consists of a relatively well modulated medium amplitude alpha rhythm of 9 Hz that is reactive to eye opening and closure. As the record progresses, the patient initially is in the waking state, photic stimulation is performed, this results in a bilateral and symmetric photic driving response. Hyperventilation is also performed with a minimal buildup of the background rhythm activities without significant slowing seen. During portions of the recording, the patient appears to enter the drowsy state with a diffuse background rhythm slowing seen. The patient never enters stage II sleep during the recording. Intermittently, dysrhythmic theta activity is seen in a generalized fashion, with occasional sharp transients emanating from the left temporal region. At no time during the recording does there appear to be evidence of actual spike or spike-wave discharges. No electrographic seizures were seen. EKG monitor shows no evidence of cardiac rhythm abnormalities with a heart rate of 66.  Impression: This is an abnormal EEG recording secondary to dysrhythmic theta activity and sharp transients emanating from the left temporal region. This study suggests a lowered seizure threshold with a left brain focus. No electrographic seizures were seen.

## 2014-12-08 ENCOUNTER — Other Ambulatory Visit: Payer: Self-pay | Admitting: *Deleted

## 2014-12-08 ENCOUNTER — Ambulatory Visit (INDEPENDENT_AMBULATORY_CARE_PROVIDER_SITE_OTHER): Payer: Medicare Other | Admitting: Infectious Disease

## 2014-12-08 ENCOUNTER — Telehealth: Payer: Self-pay | Admitting: Neurology

## 2014-12-08 ENCOUNTER — Encounter: Payer: Self-pay | Admitting: Infectious Disease

## 2014-12-08 VITALS — BP 102/68 | HR 67 | Temp 98.1°F | Wt 117.0 lb

## 2014-12-08 DIAGNOSIS — R569 Unspecified convulsions: Secondary | ICD-10-CM

## 2014-12-08 DIAGNOSIS — B20 Human immunodeficiency virus [HIV] disease: Secondary | ICD-10-CM | POA: Diagnosis not present

## 2014-12-08 DIAGNOSIS — R1012 Left upper quadrant pain: Secondary | ICD-10-CM

## 2014-12-08 DIAGNOSIS — F329 Major depressive disorder, single episode, unspecified: Secondary | ICD-10-CM

## 2014-12-08 DIAGNOSIS — R1013 Epigastric pain: Secondary | ICD-10-CM

## 2014-12-08 DIAGNOSIS — F1990 Other psychoactive substance use, unspecified, uncomplicated: Secondary | ICD-10-CM | POA: Diagnosis not present

## 2014-12-08 DIAGNOSIS — F32A Depression, unspecified: Secondary | ICD-10-CM

## 2014-12-08 DIAGNOSIS — Z87898 Personal history of other specified conditions: Secondary | ICD-10-CM

## 2014-12-08 DIAGNOSIS — F1991 Other psychoactive substance use, unspecified, in remission: Secondary | ICD-10-CM

## 2014-12-08 MED ORDER — LEVETIRACETAM 500 MG PO TABS: 500.0000 mg | ORAL_TABLET | Freq: Two times a day (BID) | ORAL | Status: DC

## 2014-12-08 NOTE — Telephone Encounter (Signed)
Patient called requesting EEG results. Please call and advise. Patient can be reached at [phone].

## 2014-12-08 NOTE — Telephone Encounter (Signed)
Spoke to patient. EEG showed lower seizure threshold. Started keppra. Reminded her not to drive or do anything that may harm herself or others should she she have a seizure. Patient acknowledged.

## 2014-12-08 NOTE — Progress Notes (Signed)
Patient ID: [name], female   DOB: 09-Nov-1980, 34 y.o.   MRN: [id number] Met with [place] today and clarified that even though she sees a counselor as a requirement to get Subutex, she feels that they do not discuss her mental health issues.  I agreed that I could see her and also clarified that I am in a unique position to not bill her Medicaid/Medicare and she was pleased with that.  She agreed to either call my office or schedule through the front desk.   Curley Spice, LCSW

## 2014-12-08 NOTE — Progress Notes (Signed)
Subjective:    Patient ID: [name], female    DOB: Jul 02, 1980, 34 y.o.   MRN: [id number]  Abdominal Pain This is a chronic problem. The current episode started more than 1 month ago. The onset quality is gradual. The problem occurs daily. The problem has been gradually worsening. The pain is located in the epigastric region and LUQ. The pain is at a severity of 5/10. The pain is moderate. The quality of the pain is dull. The abdominal pain radiates to the epigastric region and LUQ. Associated symptoms include vomiting. Pertinent negatives include no arthralgias, constipation, diarrhea, dysuria, fever, hematuria, myalgias or nausea. The pain is aggravated by certain positions and eating.    [name] with HIV infection recently diagnosed and started on anti-retroviral indications in the form of TIVICAY and Epivir via the AIDS clinical trial group. She has been on therapy for nearly a month.  She presents today for routine followup.  She is still having her epigastric and LUQ pain that has been worked up extensively.  She also it turns out has had several episodes --at least 4 of what sound like seizures. She is being worked up by Dr. Anne HahnWillis with EEG, now with an abnormal focus over left temporal lobe identified.   Review of Systems  Constitutional: Positive for appetite change. Negative for fever, chills, diaphoresis, activity change, fatigue and unexpected weight change.  HENT: Negative for congestion, rhinorrhea, sinus pressure, sneezing, sore throat and trouble swallowing.   Eyes: Negative for photophobia and visual disturbance.  Respiratory: Negative for cough, chest tightness, shortness of breath, wheezing and stridor.   Cardiovascular: Negative for chest pain, palpitations and leg swelling.  Gastrointestinal: Positive for vomiting and abdominal pain. Negative for nausea, diarrhea, constipation, blood in stool, abdominal distention and anal bleeding.    Genitourinary: Negative for dysuria, hematuria, flank pain and difficulty urinating.  Musculoskeletal: Negative for myalgias, back pain, joint swelling, arthralgias and gait problem.  Skin: Negative for color change, pallor, rash and wound.  Neurological: Positive for seizures. Negative for dizziness, tremors, weakness and light-headedness.  Hematological: Negative for adenopathy. Does not bruise/bleed easily.  Psychiatric/Behavioral: Negative for behavioral problems, confusion, sleep disturbance, dysphoric mood, decreased concentration and agitation.       Objective:   Physical Exam  Constitutional: She is oriented to person, place, and time. She appears well-developed and well-nourished. No distress.  HENT:  Head: Normocephalic and atraumatic.  Mouth/Throat: No oropharyngeal exudate.  Eyes: Conjunctivae and EOM are normal. No scleral icterus.  Neck: Normal range of motion. Neck supple.  Cardiovascular: Normal rate and regular rhythm.   Pulmonary/Chest: Effort normal. No respiratory distress. She has no wheezes.  Abdominal: Soft. She exhibits no distension, no pulsatile liver, no fluid wave, no ascites and no mass. There is tenderness in the left upper quadrant. There is no rigidity, no rebound, no guarding, no CVA tenderness, no tenderness at McBurney's point and negative Murphy's sign.  Musculoskeletal: She exhibits no edema or tenderness.  Neurological: She is alert and oriented to person, place, and time. She exhibits normal muscle tone. Coordination normal.  Skin: Skin is warm and dry. No rash noted. She is not diaphoretic. No erythema. No pallor.  Psychiatric: She has a normal mood and affect. Her behavior is normal. Judgment and thought content normal.          Assessment & Plan:  HIV: continue Tivicay and 3tc. Her viral load is undetectable and CD4 healthy  Opioid maintenance being seen by clinic  here in town.  Depression schizoaffective disorder: seeing a counselor and  introduced her with "warm handoff" to [place] today   LUQ pain, epigastric pain, will refer to GI  Seizures: her EEG and history suggest that she is having seizures and will need an AED. I would just make sure that NONE of the meds pick interact with her TIVICAY  I spent greater than 25 minutes with the patient including greater than 50% of time in face to face counsel of the patient re her HIV, IVDU, depression, seizures, abdominal pain and in coordination of their care.

## 2014-12-08 NOTE — Telephone Encounter (Signed)
Spoke w/ [name] to let her know her EEG was abnormal. Showed seizure activity and that she had a lower seizure threshold. Dr. Lucia GaskinsAhern would like her to start Keppra 500mg  twice daily. Told her it was sent to the [facility] on [address]. [name] would like Dr. Lucia GaskinsAhern to call her back to talk about EEG in more detail/medication. Told her I would let Dr. Lucia GaskinsAhern know. She verbalized understanding.

## 2014-12-10 ENCOUNTER — Ambulatory Visit
Admission: RE | Admit: 2014-12-10 | Discharge: 2014-12-10 | Disposition: A | Discharge status: Home / Self Care | Payer: Medicare Other | Source: Ambulatory Visit | Attending: Neurology | Admitting: Neurology

## 2014-12-10 DIAGNOSIS — R569 Unspecified convulsions: Secondary | ICD-10-CM | POA: Diagnosis not present

## 2014-12-10 MED ORDER — GADOBENATE DIMEGLUMINE 529 MG/ML IV SOLN
10.0000 mL | Freq: Once | INTRAVENOUS | Status: AC | PRN
Start: 2014-12-10 — End: 2014-12-10

## 2014-12-11 ENCOUNTER — Encounter: Payer: Self-pay | Admitting: Nurse Practitioner

## 2014-12-11 ENCOUNTER — Telehealth: Payer: Self-pay | Admitting: Licensed Clinical Social Worker

## 2014-12-11 ENCOUNTER — Encounter (INDEPENDENT_AMBULATORY_CARE_PROVIDER_SITE_OTHER): Payer: Self-pay | Admitting: *Deleted

## 2014-12-11 VITALS — BP 103/63 | HR 62 | Temp 98.1°F | Resp 16 | Wt 112.8 lb

## 2014-12-11 DIAGNOSIS — N951 Menopausal and female climacteric states: Secondary | ICD-10-CM

## 2014-12-11 DIAGNOSIS — Z006 Encounter for examination for normal comparison and control in clinical research program: Secondary | ICD-10-CM

## 2014-12-11 NOTE — Telephone Encounter (Signed)
Sent a referral to [facility] via epic today.

## 2014-12-11 NOTE — Addendum Note (Signed)
Addended by: Starleen ArmsYARBOROUGH, TAMIKA D on: 12/11/2014 12:14 PM   Modules accepted: Orders

## 2014-12-11 NOTE — Progress Notes (Signed)
Sally DikeJennifer is here today for week 12 of study [id number]. She has been very adherent with her study meds, not missing any doses. She had a seizure a few weeks ago and has been undergoing testing through neurology. She told me that they told her she has a low threshhold for seizures and that they started her on Keppra 2 days ago. She requests checking hormone levels and wonders if she is not getting ready to go through menopause, periods are inconsistent, may have 1 day of passing clots, then stops and starts up again 3 days later. Will ask Dr. Daiva EvesVan Dam [facility]. I recommended she see a gynecologist for workup. She says this has been going on about as long as the seizure activity has, since last October. She still has abdominal pain and has been referred to GI for workup. She will return in 4 weeks for the next study visit.

## 2014-12-12 LAB — BILIRUBIN,DIRECT & INDIRECT (FRACTIONATED)
Bilirubin, Direct: 0.1 mg/dL (ref 0.0–0.3)
Indirect Bilirubin: 0.4 mg/dL (ref 0.2–1.2)

## 2014-12-12 LAB — COMPREHENSIVE METABOLIC PANEL
ALT: 14 U/L (ref 0–35)
AST: 15 U/L (ref 0–37)
Albumin: 4 g/dL (ref 3.5–5.2)
Alkaline Phosphatase: 64 U/L (ref 39–117)
BUN: 7 mg/dL (ref 6–23)
CO2: 28 meq/L (ref 19–32)
Calcium: 9.1 mg/dL (ref 8.4–10.5)
Chloride: 107 mEq/L (ref 96–112)
Creat: 0.81 mg/dL (ref 0.50–1.10)
Glucose, Bld: 114 mg/dL — ABNORMAL HIGH (ref 70–99)
Potassium: 4.1 mEq/L (ref 3.5–5.3)
SODIUM: 144 meq/L (ref 135–145)
TOTAL PROTEIN: 6.3 g/dL (ref 6.0–8.3)
Total Bilirubin: 0.5 mg/dL (ref 0.2–1.2)

## 2014-12-12 LAB — FOLLICLE STIMULATING HORMONE: FSH: 7 m[IU]/mL

## 2014-12-15 NOTE — Telephone Encounter (Signed)
-----   Message from Anson FretAntonia B Ahern, MD sent at 12/14/2014  1:01 PM EDT ----- Let patient know this is a normal MRi of the brain. She has chronic sinusitis. She should continue to take her anti-epileptic medications, a normal brain does not rule out seizures.

## 2014-12-15 NOTE — Telephone Encounter (Signed)
I have spoken with [name] this afternoon and per AA, advised that mri brain was normal; that this does not r/o sx. d/o, so she should continue her sz. meds.  I have also advised that mri brain did show chronic sinusitis.  [name] verbalized understanding of same/fim

## 2014-12-24 ENCOUNTER — Encounter: Payer: Self-pay | Admitting: Neurology

## 2014-12-24 ENCOUNTER — Ambulatory Visit (INDEPENDENT_AMBULATORY_CARE_PROVIDER_SITE_OTHER): Payer: Medicare Other | Admitting: Neurology

## 2014-12-24 VITALS — BP 115/77 | HR 64 | Temp 98.3°F | Ht 66.0 in | Wt 118.6 lb

## 2014-12-24 DIAGNOSIS — R7302 Impaired glucose tolerance (oral): Secondary | ICD-10-CM | POA: Diagnosis not present

## 2014-12-24 DIAGNOSIS — R5382 Chronic fatigue, unspecified: Secondary | ICD-10-CM

## 2014-12-24 DIAGNOSIS — R1012 Left upper quadrant pain: Secondary | ICD-10-CM

## 2014-12-24 DIAGNOSIS — R202 Paresthesia of skin: Secondary | ICD-10-CM | POA: Insufficient documentation

## 2014-12-24 DIAGNOSIS — R251 Tremor, unspecified: Secondary | ICD-10-CM

## 2014-12-24 DIAGNOSIS — E538 Deficiency of other specified B group vitamins: Secondary | ICD-10-CM

## 2014-12-24 DIAGNOSIS — E531 Pyridoxine deficiency: Secondary | ICD-10-CM | POA: Diagnosis not present

## 2014-12-24 DIAGNOSIS — R5383 Other fatigue: Secondary | ICD-10-CM | POA: Insufficient documentation

## 2014-12-24 DIAGNOSIS — E519 Thiamine deficiency, unspecified: Secondary | ICD-10-CM | POA: Diagnosis not present

## 2014-12-24 NOTE — Progress Notes (Signed)
[facility]    Provider: Dr Lucia Gaskins Referring Provider: Daiva Eves, Lisette Grinder, MD Primary Care Physician: Acey Lav, MD  CC: seizures  Interval update: She Is here to discuss eeg. Reviewed with her. She is on the seizure medication and doing well. She has not been feeling well. Has a new issue, paresthesias in the limbs. She has abdominal pain they cannot figure out. She is very distressed, thinks there is something wrong. She is chronically fatigued, has a tremor, has hold/heat intolerance.   EEG: This is an abnormal EEG recording secondary to dysrhythmic theta activity and sharp transients emanating from the left temporal region. This study suggests a lowered seizure threshold with a left brain focus. No electrographic seizures were seen.  HPI: [name] is a 34 y.o. female here as a referral from Dr. Daiva Eves for seizures. She has a long history of polysubstance abuse and mental Health history of depression, anxiety disorder, PTSD, agoraphobia and Schizoaffective disorder per patient. She is HIV positive. She reports multiple seizure-like events. Her first episode was October of last year. She was driving and then the next thing she knew, she was in the back of an ambulance. She has no idea what happened. Then at Thanksgiving it happened again, she was sitting there watching TV and then she found herself on the floor. She is a very poor historian. She doesn't know what happens, she just loses consciousness. Her husband says she had another episode march 11th of this year, he was in the shower and he heard a noise, when he came out she was shaking and her eyes were closed, lasted at least 10 minutes. Described by husband as shaking, she starts convulsing and smacking her head on the floor. Her head is going side to side like she is shaking her head "no -no". She is confused afterwards, she doesn't know anything for 20 minutes, doesn't even know the year. She is  barely breathing during the events which can last longer than even 15 minutes. She has bitten her tongue twice. No urination. She cannot follow commands during the event. She cannot answer questions during the event. She denies any current use of any illegal substance other than marijuana and no alcohol use or withdrawal. She reports smacking her head on the floor in a "no-no" pattern. She is under a lot stress, she is having marital problems. No inciting factors, no head trauma. No aura. No headache. She is confused after every episode and is also tired. No FHx of seizures or seizures as a child. No focal neurologic deficits.   Reviewed notes, labs and imaging from outside physicians, which showed:   Recent cbc and cmp unremarkable. Urine drug screen +THC. HIV RNA Viral Load < 40.    CT 11/24/2014: showed No acute intracranial abnormalities including mass lesion or mass effect, hydrocephalus, extra-axial fluid collection, midline shift, hemorrhage, or acute infarction, large ischemic events (personally reviewed images)  6/27: EMS sent to home for seizure. No tongue biting or urination. Per friend, 15 minute LOC with generalized body shaking and patient's head repeatedly hitting a wall. Patient with closed eyes. When she came to she was confused to person and event as well as place. No loss of control of bladder or bowel. No tongue biting. Reviewed notes back to 2013 and do not see any other ED visits for seizure-like activity.    Review of Systems: Patient complains of symptoms per HPI as well as the following symptoms: Palpitations, feeling  hot, feeling cold, flushing, trouble swallowing, memory loss, confusion, headache, difficulty swallowing, seizure, passing out, depression, anxiety. Pertinent negatives per HPI. All others negative.  History   Social History  . Marital Status: Married    Spouse Name: Jonny Ruiz  . Number of Children: 2  . Years of Education: 12+   Occupational History  .  Unemployed    Social History Main Topics  . Smoking status: Current Every Day Smoker -- 1.00 packs/day for 20 years    Types: Cigarettes  . Smokeless tobacco: Never Used  . Alcohol Use: No  . Drug Use: Yes    Special: Marijuana, Heroin  . Sexual Activity: Not on file   Other Topics Concern  . Not on file   Social History Narrative   Lives at home with kids   Caffeine use: Drinks coffee (1 cup per day)    Family History  Problem Relation Age of Onset  . Diabetes Maternal Grandfather   . Seizures Neg Hx     Past Medical History  Diagnosis Date  . HIV infection   . Abdominal pain, left upper quadrant 10/08/2014    Past Surgical History  Procedure Laterality Date  . Cholecystectomy    . Fracture surgery      2-right hand  . Ureter surgery      Placement and removal     Current Outpatient Prescriptions  Medication Sig Dispense Refill  . buprenorphine (SUBUTEX) 8 MG SUBL SL tablet Place 4 mg under the tongue daily. 4x/day    . dolutegravir (TIVICAY) 50 MG tablet Take 1 tablet (50 mg total) by mouth daily. 30 tablet 11  . lamivudine (EPIVIR) 300 MG tablet Take 1 tablet (300 mg total) by mouth daily. 30 tablet 11  . levETIRAcetam (KEPPRA) 500 MG tablet Take 1 tablet (500 mg total) by mouth 2 (two) times daily. 60 tablet 6  . TRAZODONE HCL PO Take 200 mg by mouth as needed.     No current facility-administered medications for this visit.    Allergies as of 12/24/2014  . (No Known Allergies)    Vitals: BP 115/77 mmHg  Pulse 64  Temp(Src) 98.3 F (36.8 C) (Oral)  Ht 5\' 6"  (1.676 m)  Wt 118 lb 9.6 oz (53.797 kg)  BMI 19.15 kg/m2 Last Weight:  Wt Readings from Last 1 Encounters:  12/24/14 118 lb 9.6 oz (53.797 kg)   Last Height:   Ht Readings from Last 1 Encounters:  12/24/14 5\' 6"  (1.676 m)    Physical exam: Exam: Gen: NAD, conversant, well nourised, thin, well groomed  CV: RRR, no MRG. No Carotid Bruits. No peripheral edema, warm,  nontender Eyes: Conjunctivae clear without exudates or hemorrhage  Neuro: Detailed Neurologic Exam  Speech:  Speech is normal; fluent and spontaneous with normal comprehension.  Cognition:  The patient is oriented to person, place, and time;   recent and remote memory intact;   language fluent;   normal attention, concentration,   fund of knowledge Cranial Nerves:  The pupils are equal, round, and reactive to light. The fundi are flat. Visual fields are full to finger confrontation. Extraocular movements are intact. Trigeminal sensation is intact and the muscles of mastication are normal. The face is symmetric. The palate elevates in the midline. Hearing intact. Voice is normal. Shoulder shrug is normal. The tongue has normal motion without fasciculations.   Motor Observation:  No asymmetry, no atrophy, and no involuntary movements noted. Tone:  Normal muscle tone.   Posture:  Posture  is normal. normal erect   Strength:  Strength is V/V in the upper and lower limbs.    Sensation: intact to LT, pp     Assessment/Plan: 34 y.o. female here as a referral from Dr. Daiva Eves for seizures. She has a long history of polysubstance abuse and mental Health history of depression, anxiety disorder, PTSD, agoraphobia and Schizoaffective disorder per patient. She is HIV positive. She reports multiple for seizure-like events. Some of the features are not consistent with seizures such as length of 15 minutes or more, shaking her head in a "no-no "pattern, repeatedly banging her head on the wall.but EEG was abnormal so started keppra. Today she has other complaints, chronic fatigue, paresthesias, abdominal pain.  EEG - abnormal MRI brain unremarkable  Will perform a serum, workup including tsh.continue keppra.  No driving until 6 months seizure free, no bathing or swimming alone or anything that could hurt patient or others should he have a seizure. Patient  states that she has been told this before an dunderstands.  Sally Dean, MD  [facility] [address] [place], [place] [address]  Phone [phone] Fax [phone]  A total of 30 minutes was spent face-to-face with this patient. Over half this time was spent on counseling patient on the seizure, fatigue,paresthesias, abdominal pain diagnosis and different diagnostic and therapeutic options available.

## 2014-12-26 ENCOUNTER — Ambulatory Visit (INDEPENDENT_AMBULATORY_CARE_PROVIDER_SITE_OTHER): Payer: Medicare Other | Admitting: Nurse Practitioner

## 2014-12-26 ENCOUNTER — Encounter: Payer: Self-pay | Admitting: Nurse Practitioner

## 2014-12-26 VITALS — BP 110/68 | HR 75 | Ht 66.0 in | Wt 116.0 lb

## 2014-12-26 DIAGNOSIS — R1310 Dysphagia, unspecified: Secondary | ICD-10-CM | POA: Diagnosis not present

## 2014-12-26 DIAGNOSIS — R1010 Upper abdominal pain, unspecified: Secondary | ICD-10-CM | POA: Insufficient documentation

## 2014-12-26 DIAGNOSIS — R634 Abnormal weight loss: Secondary | ICD-10-CM | POA: Diagnosis not present

## 2014-12-26 MED ORDER — OMEPRAZOLE 40 MG PO CPDR: 40.0000 mg | DELAYED_RELEASE_CAPSULE | Freq: Every day | ORAL | Status: DC

## 2014-12-26 NOTE — Patient Instructions (Addendum)
You have been scheduled for an endoscopy. Please follow the written instructions given to you at your visit today.  If you use inhalers (even only as needed), please bring them with you on the day of your procedure. Your physician has requested that you go to www.startemmi.com and enter the access code given to you at your visit today. This web site gives a general overview about your procedure. However, you should still follow specific instructions given to you by our office regarding your preparation for the procedure.   We have sent the following medications to your pharmacy for you to pick up at your convenience:  Omeprazole

## 2014-12-26 NOTE — Progress Notes (Signed)
HPI :   Patient is a 34 year old female recently diagnosed with HIV, on anti-retroviral medications. Other medical history includes seizures, anxiety, posttraumatic stress disorder, and schizoaffective disorder.   [name] is referred by Infectious Disease for a several month history of upper abdominal pain. She is s/p cholecystectomy in 2012. The pain seems to originate in the left upper quadrant but spans across the whole upper abdomen without radiation to back. It occurs on a daily basis independent of eating though food definitely exacerbates the pain.  Patient endorses significant weight loss over the last several months. She also endorses dysphagia. Solids seem to stuck in the esophagus.  She often regurgitates liquids. No odynophagia. No heartburn. Bowel movements are normal.  Past Medical History  Diagnosis Date  . HIV infection   . Anxiety   . Arthritis   . Chronic headaches   . Depression   . Gallstones   . Pneumonia   . Seizures    Past Surgical History  Procedure Laterality Date  . Cholecystectomy    . Fracture surgery      2-right hand  . Ureter surgery      Placement and removal     Family History  Problem Relation Age of Onset  . Diabetes Maternal Grandfather   . Heart disease Maternal Grandfather   . Seizures Neg Hx   . Breast cancer Mother    History  Substance Use Topics  . Smoking status: Current Every Day Smoker -- 1.00 packs/day for 20 years    Types: Cigarettes  . Smokeless tobacco: Never Used  . Alcohol Use: No   Current Outpatient Prescriptions  Medication Sig Dispense Refill  . buprenorphine (SUBUTEX) 8 MG SUBL SL tablet Place 4 mg under the tongue daily. 4x/day    . dolutegravir (TIVICAY) 50 MG tablet Take 1 tablet (50 mg total) by mouth daily. 30 tablet 11  . lamivudine (EPIVIR) 300 MG tablet Take 1 tablet (300 mg total) by mouth daily. 30 tablet 11  . levETIRAcetam (KEPPRA) 500 MG tablet Take 1 tablet (500 mg total) by mouth 2 (two) times  daily. 60 tablet 6  . TRAZODONE HCL PO Take 200 mg by mouth as needed.     No current facility-administered medications for this visit.   No Known Allergies   Review of Systems: Positive for anxiety, arthritis, back pain, confusion, depression, fatigue, headaches, menstrual pain, muscle pain and cramps, sleeping problems and swollen glands.All other systems reviewed and negative except where noted in HPI.   Physical Exam: BP 110/68 mmHg  Pulse 75  Ht  (1.676 m)  Wt 116 lb (52.617 kg)  BMI 18.73 kg/m2 Constitutional: Pleasant, thin white female in no acute distress. HEENT: Normocephalic and atraumatic. Conjunctivae are normal. No scleral icterus. Neck supple.  Cardiovascular: Normal rate, regular rhythm.  Pulmonary/chest: Effort normal and breath sounds normal. No wheezing, rales or rhonchi. Abdominal: Soft, nondistended, nontender. Bowel sounds active throughout. There are no masses palpable. No hepatomegaly. Extremities: no edema Lymphadenopathy: No cervical adenopathy noted. Neurological: Alert and oriented to person place and time. Skin: Skin is warm and dry. No rashes noted. Psychiatric: Normal mood and affect. Behavior is normal.   ASSESSMENT AND PLAN:  1.  Pleasant 34 year old female with a several month history of upper abdominal pain , worse with eating.  Her weight is down from 135 late March to 116 pounds today.   She had an unrevealing CT scan of the abdomen and pelvis for this pain done May  2016.  LFTs and lipase normal.  Etiology of pain unclear. Patient will need upper endoscopy for further evaluation. The benefits, risks, and potential complications of EGD with possible biopsies were discussed with the patient and she agrees to proceed.   Trial of omeprazole in the interim  2.  Solid food dysphagia / regurgitation of liquids.  Rule out esophageal stricture.  Further evaluation and treatment at time of upper endoscopy.   3.  History of drug addiction. Patient  currently on Subutex  4. Seizures, recently diagnosed. Followed by Neurology   Cc: Randall Hiss, MD

## 2014-12-29 ENCOUNTER — Encounter: Payer: Self-pay | Admitting: Gastroenterology

## 2014-12-29 ENCOUNTER — Ambulatory Visit (AMBULATORY_SURGERY_CENTER): Payer: Medicare Other | Admitting: Gastroenterology

## 2014-12-29 ENCOUNTER — Telehealth: Payer: Self-pay | Admitting: Gastroenterology

## 2014-12-29 VITALS — BP 100/61 | HR 58 | Temp 98.5°F | Resp 18 | Ht 66.0 in | Wt 116.0 lb

## 2014-12-29 DIAGNOSIS — K209 Esophagitis, unspecified without bleeding: Secondary | ICD-10-CM

## 2014-12-29 DIAGNOSIS — R1013 Epigastric pain: Secondary | ICD-10-CM | POA: Diagnosis not present

## 2014-12-29 DIAGNOSIS — R1010 Upper abdominal pain, unspecified: Secondary | ICD-10-CM

## 2014-12-29 DIAGNOSIS — R1310 Dysphagia, unspecified: Secondary | ICD-10-CM

## 2014-12-29 DIAGNOSIS — K208 Other esophagitis: Secondary | ICD-10-CM | POA: Diagnosis not present

## 2014-12-29 HISTORY — PX: ESOPHAGOGASTRODUODENOSCOPY (EGD) WITH PROPOFOL: SHX5813

## 2014-12-29 MED ORDER — HYOSCYAMINE SULFATE ER 0.375 MG PO TBCR: EXTENDED_RELEASE_TABLET | ORAL | Status: DC

## 2014-12-29 MED ORDER — PANTOPRAZOLE SODIUM 40 MG PO TBEC: 40.0000 mg | DELAYED_RELEASE_TABLET | Freq: Every day | ORAL | Status: DC

## 2014-12-29 MED ORDER — SODIUM CHLORIDE 0.9 % IV SOLN: 500.0000 mL | INTRAVENOUS | Status: DC

## 2014-12-29 MED ORDER — GLYCOPYRROLATE 2 MG PO TABS: 2.0000 mg | ORAL_TABLET | Freq: Three times a day (TID) | ORAL | Status: DC

## 2014-12-29 NOTE — Telephone Encounter (Signed)
Dr Levan Hurst did not pay for Hyomax  Resent Robinul for her

## 2014-12-29 NOTE — Progress Notes (Signed)
To recovery, report to Myetrs, RN, VSS 

## 2014-12-29 NOTE — Patient Instructions (Signed)
YOU HAD AN ENDOSCOPIC PROCEDURE TODAY AT THE [facility]:   Refer to the procedure report that was given to you for any specific questions about what was found during the examination.  If the procedure report does not answer your questions, please call your gastroenterologist to clarify.  If you requested that your care partner not be given the details of your procedure findings, then the procedure report has been included in a sealed envelope for you to review at your convenience later.  YOU SHOULD EXPECT: Some feelings of bloating in the abdomen. Passage of more gas than usual.  Walking can help get rid of the air that was put into your GI tract during the procedure and reduce the bloating. If you had a lower endoscopy (such as a colonoscopy or flexible sigmoidoscopy) you may notice spotting of blood in your stool or on the toilet paper. If you underwent a bowel prep for your procedure, you may not have a normal bowel movement for a few days.  Please Note:  You might notice some irritation and congestion in your nose or some drainage.  This is from the oxygen used during your procedure.  There is no need for concern and it should clear up in a day or so.  SYMPTOMS TO REPORT IMMEDIATELY:    Following upper endoscopy (EGD)  Vomiting of blood or coffee ground material  New chest pain or pain under the shoulder blades  Painful or persistently difficult swallowing  New shortness of breath  Fever of 100F or higher  Black, tarry-looking stools  For urgent or emergent issues, a gastroenterologist can be reached at any hour by calling [phone].   DIET: Your first meal following the procedure should be a small meal and then it is ok to progress to your normal diet. Heavy or fried foods are harder to digest and may make you feel nauseous or bloated.  Likewise, meals heavy in dairy and vegetables can increase bloating.  Drink plenty of fluids but you should avoid alcoholic beverages  for 24 hours.  ACTIVITY:  You should plan to take it easy for the rest of today and you should NOT DRIVE or use heavy machinery until tomorrow (because of the sedation medicines used during the test).    FOLLOW UP: Our staff will call the number listed on your records the next business day following your procedure to check on you and address any questions or concerns that you may have regarding the information given to you following your procedure. If we do not reach you, we will leave a message.  However, if you are feeling well and you are not experiencing any problems, there is no need to return our call.  We will assume that you have returned to your regular daily activities without incident.  If any biopsies were taken you will be contacted by phone or by letter within the next 1-3 weeks.  Please call us at [phone] if you have not heard about the biopsies in 3 weeks.    SIGNATURES/CONFIDENTIALITY: You and/or your care partner have signed paperwork which will be entered into your electronic medical record.  These signatures attest to the fact that that the information above on your After Visit Summary has been reviewed and is understood.  Full responsibility of the confidentiality of this discharge information lies with you and/or your care-partner.  Biopsy results pending.  Begin Protonix 40 mg daily Hyomax twice daily for 5 days, then as needed. Office  visit 4-6 weeks.

## 2014-12-29 NOTE — Progress Notes (Signed)
Called to room to assist during endoscopic procedure.  Patient ID and intended procedure confirmed with present staff. Received instructions for my participation in the procedure from the performing physician.  

## 2014-12-29 NOTE — Op Note (Signed)
[facility] [address] [place], [address]   ENDOSCOPY PROCEDURE REPORT  PATIENT: Sally, Rowe  MR#: [id number] BIRTHDATE: 01-Aug-1980 , 34  yrs. old GENDER: female ENDOSCOPIST: Louis Meckel, MD REFERRED BY: PROCEDURE DATE:  12/29/2014 PROCEDURE:  EGD w/ biopsy ASA CLASS:     Class II INDICATIONS:  epigastric abdominal pain. MEDICATIONS: Monitored anesthesia care and Propofol 200 mg IV TOPICAL ANESTHETIC:  DESCRIPTION OF PROCEDURE: After the risks benefits and alternatives of the procedure were thoroughly explained, informed consent was obtained.  The LB [id number] endoscope was introduced through the mouth and advanced to the second portion of the duodenum , Without limitations.  The instrument was slowly withdrawn as the mucosa was fully examined.    There was some irregularity at the GE junction with areas of slightly inflamed mucosa.  Z line was at 36 cm.  Multiple biopsies were taken.   Except for the findings listed, the EGD was otherwise normal.  Retroflexed views revealed no abnormalities.     The scope was then withdrawn from the patient and the procedure completed.  COMPLICATIONS: There were no immediate complications.  ENDOSCOPIC IMPRESSION: 1.   There was some irregularity at the GE junction with areas of slightly inflamed mucosa.  Z line was at 36 cm.  Multiple biopsies were taken 2.   EGD was otherwise normal  RECOMMENDATIONS: Await biopsy results begin Protonix 40 mg daily; hyomax twice a day for 5 days, then as needed Office visit 4-6 weeks   REPEAT EXAM:  eSigned:  Louis Meckel, MD 12/29/2014 2:16 PM    [id number] Algis Liming, MD

## 2014-12-29 NOTE — Progress Notes (Signed)
Reviewed and agree with management. Markie Frith D. Kaiyan Luczak, M.D., FACG  

## 2014-12-30 ENCOUNTER — Telehealth: Payer: Self-pay | Admitting: Neurology

## 2014-12-30 ENCOUNTER — Other Ambulatory Visit: Payer: Self-pay | Admitting: *Deleted

## 2014-12-30 ENCOUNTER — Other Ambulatory Visit (INDEPENDENT_AMBULATORY_CARE_PROVIDER_SITE_OTHER): Payer: Medicare Other

## 2014-12-30 ENCOUNTER — Telehealth: Payer: Self-pay

## 2014-12-30 DIAGNOSIS — R109 Unspecified abdominal pain: Secondary | ICD-10-CM

## 2014-12-30 LAB — CBC WITH DIFFERENTIAL/PLATELET
BASOS ABS: 0 10*3/uL (ref 0.0–0.1)
BASOS PCT: 0.4 % (ref 0.0–3.0)
Eosinophils Absolute: 0.1 10*3/uL (ref 0.0–0.7)
Eosinophils Relative: 1.8 % (ref 0.0–5.0)
HEMATOCRIT: 42.3 % (ref 36.0–46.0)
Hemoglobin: 14.1 g/dL (ref 12.0–15.0)
Lymphocytes Relative: 33.5 % (ref 12.0–46.0)
Lymphs Abs: 2.7 10*3/uL (ref 0.7–4.0)
MCHC: 33.3 g/dL (ref 30.0–36.0)
MCV: 93.7 fl (ref 78.0–100.0)
MONOS PCT: 3.6 % (ref 3.0–12.0)
Monocytes Absolute: 0.3 10*3/uL (ref 0.1–1.0)
NEUTROS ABS: 4.9 10*3/uL (ref 1.4–7.7)
NEUTROS PCT: 60.7 % (ref 43.0–77.0)
PLATELETS: 220 10*3/uL (ref 150.0–400.0)
RBC: 4.52 Mil/uL (ref 3.87–5.11)
RDW: 14.2 % (ref 11.5–15.5)
WBC: 8.1 10*3/uL (ref 4.0–10.5)

## 2014-12-30 LAB — AMYLASE: AMYLASE: 20 U/L — AB (ref 27–131)

## 2014-12-30 LAB — HEPATIC FUNCTION PANEL
ALT: 9 U/L (ref 0–35)
AST: 12 U/L (ref 0–37)
Albumin: 4.5 g/dL (ref 3.5–5.2)
Alkaline Phosphatase: 66 U/L (ref 39–117)
BILIRUBIN DIRECT: 0.2 mg/dL (ref 0.0–0.3)
TOTAL PROTEIN: 7 g/dL (ref 6.0–8.3)
Total Bilirubin: 0.8 mg/dL (ref 0.2–1.2)

## 2014-12-30 LAB — LIPASE: Lipase: 7 U/L — ABNORMAL LOW (ref 11.0–59.0)

## 2014-12-30 NOTE — Telephone Encounter (Signed)
I am not sure what is going on with [facility]. She can be seen by id clinic MD but I dont think her GI ssx have anything do to with her current ARV regimen

## 2014-12-30 NOTE — Telephone Encounter (Signed)
Ok

## 2014-12-30 NOTE — Telephone Encounter (Signed)
Please arrange for her to have an amylase, lipase, CBC and LFTs drawn today.  She needs to try the Zella Ball all that I prescribed. I will contact Dr. Algis Liming

## 2014-12-30 NOTE — Telephone Encounter (Signed)
Patient called stating she saw flashing lights this morning. It happened a few times this morning. It was in the peripheral vision. She experiened this with migraines many years ago. She had an endoscopy yesterday and was wondering if could have anything to do with the anesthesia. She started taking Keppra on 12/08/14 and is inquiring if this could be side effect. She is also inquiring about results from lab work. Please call and advise. Patient can be reached at [phone].

## 2014-12-30 NOTE — Telephone Encounter (Signed)
Returned patient phone call earlier after contacting Dr. Arlyce Dice with orders for labs to be drawn today.

## 2014-12-30 NOTE — Telephone Encounter (Signed)
  Follow up Call-  Call back number 12/29/2014  Post procedure Call Back phone  # [phone]  Permission to leave phone message Yes     Patient questions:  Do you have a fever, pain , or abdominal swelling? Yes.   Pain Score  7 *  Have you tolerated food without any problems? No.  Have you been able to return to your normal activities? Yes.    Do you have any questions about your discharge instructions: Diet   No. Medications  No. Follow up visit  No.  Do you have questions or concerns about your Care? No.  Actions: * If pain score is 4 or above: No action needed, pain <4.  Patient stated in worse pain now then prior to procedure. Level is a 7. Not able to tolerate food. Woke up during the night in excruciating pain. Vomited once during the night. Medication prescribed is not working. Constant nauseated. Please advise.

## 2014-12-30 NOTE — Telephone Encounter (Signed)
Spoke to patient, advised to hydrate- this can be a procedure related event. Propofol was used with the endoscopy  , not a keppra related side effect( not to my knowledge) , she was fine with conservative approach and calls [place] back if further questions arise. CD

## 2015-01-01 ENCOUNTER — Emergency Department (HOSPITAL_COMMUNITY)
Admission: EM | Admit: 2015-01-01 | Discharge: 2015-01-02 | Disposition: A | Discharge status: Home / Self Care | Payer: Medicare Other | Attending: Emergency Medicine | Admitting: Emergency Medicine

## 2015-01-01 ENCOUNTER — Emergency Department (HOSPITAL_COMMUNITY): Payer: Medicare Other

## 2015-01-01 ENCOUNTER — Inpatient Hospital Stay (HOSPITAL_COMMUNITY): Admit: 2015-01-01 | Payer: Medicare Other

## 2015-01-01 ENCOUNTER — Ambulatory Visit (HOSPITAL_COMMUNITY)
Admit: 2015-01-01 | Discharge: 2015-01-01 | Disposition: A | Discharge status: Home / Self Care | Payer: Medicare Other | Attending: Infectious Disease | Admitting: Infectious Disease

## 2015-01-01 ENCOUNTER — Telehealth: Payer: Self-pay | Admitting: *Deleted

## 2015-01-01 ENCOUNTER — Encounter (HOSPITAL_COMMUNITY): Payer: Self-pay

## 2015-01-01 DIAGNOSIS — B86 Scabies: Secondary | ICD-10-CM

## 2015-01-01 DIAGNOSIS — G9520 Unspecified cord compression: Secondary | ICD-10-CM

## 2015-01-01 DIAGNOSIS — L0291 Cutaneous abscess, unspecified: Secondary | ICD-10-CM

## 2015-01-01 DIAGNOSIS — R339 Retention of urine, unspecified: Secondary | ICD-10-CM

## 2015-01-01 DIAGNOSIS — F17210 Nicotine dependence, cigarettes, uncomplicated: Secondary | ICD-10-CM | POA: Insufficient documentation

## 2015-01-01 DIAGNOSIS — B20 Human immunodeficiency virus [HIV] disease: Secondary | ICD-10-CM | POA: Diagnosis not present

## 2015-01-01 LAB — URINALYSIS, ROUTINE W REFLEX MICROSCOPIC
Bilirubin Urine: NEGATIVE
Glucose, UA: NEGATIVE mg/dL
Hgb urine dipstick: NEGATIVE
Ketones, ur: NEGATIVE mg/dL
Leukocytes, UA: NEGATIVE
Nitrite: NEGATIVE
Protein, ur: NEGATIVE mg/dL
Specific Gravity, Urine: 1.005 (ref 1.005–1.030)
Urobilinogen, UA: 0.2 mg/dL (ref 0.0–1.0)
pH: 7 (ref 5.0–8.0)

## 2015-01-01 LAB — CBC WITH DIFFERENTIAL/PLATELET
BASOS PCT: 0 % (ref 0–1)
Basophils Absolute: 0 10*3/uL (ref 0.0–0.1)
Eosinophils Absolute: 0.3 10*3/uL (ref 0.0–0.7)
Eosinophils Relative: 6 % — ABNORMAL HIGH (ref 0–5)
HCT: 40.3 % (ref 36.0–46.0)
HEMOGLOBIN: 13.5 g/dL (ref 12.0–15.0)
LYMPHS ABS: 2.5 10*3/uL (ref 0.7–4.0)
Lymphocytes Relative: 43 % (ref 12–46)
MCH: 31.6 pg (ref 26.0–34.0)
MCHC: 33.5 g/dL (ref 30.0–36.0)
MCV: 94.4 fL (ref 78.0–100.0)
MONOS PCT: 5 % (ref 3–12)
Monocytes Absolute: 0.3 10*3/uL (ref 0.1–1.0)
Neutro Abs: 2.7 10*3/uL (ref 1.7–7.7)
Neutrophils Relative %: 46 % (ref 43–77)
PLATELETS: 179 10*3/uL (ref 150–400)
RBC: 4.27 MIL/uL (ref 3.87–5.11)
RDW: 13.4 % (ref 11.5–15.5)
WBC: 5.9 10*3/uL (ref 4.0–10.5)

## 2015-01-01 LAB — BASIC METABOLIC PANEL
Anion gap: 5 (ref 5–15)
BUN: 6 mg/dL (ref 6–20)
CHLORIDE: 106 mmol/L (ref 101–111)
CO2: 28 mmol/L (ref 22–32)
Calcium: 9.2 mg/dL (ref 8.9–10.3)
Creatinine, Ser: 0.83 mg/dL (ref 0.44–1.00)
GFR calc Af Amer: >60 mL/min (ref >60)
GLUCOSE: 81 mg/dL (ref 65–99)
Potassium: 3.9 mmol/L (ref 3.5–5.1)
Sodium: 139 mmol/L (ref 135–145)

## 2015-01-01 LAB — POC URINE PREG, ED: Preg Test, Ur: NEGATIVE

## 2015-01-01 MED ORDER — GADOBENATE DIMEGLUMINE 529 MG/ML IV SOLN: 15.0000 mL | Freq: Once | INTRAVENOUS | Status: AC | PRN

## 2015-01-01 MED ORDER — PHENAZOPYRIDINE HCL 100 MG PO TABS
100.0000 mg | ORAL_TABLET | Freq: Three times a day (TID) | ORAL | Status: DC
  Administered 2015-01-01: 100 mg via ORAL
  Filled 2015-01-01: qty 1

## 2015-01-01 MED ORDER — MORPHINE SULFATE 4 MG/ML IJ SOLN
4.0000 mg | Freq: Once | INTRAMUSCULAR | Status: AC
  Administered 2015-01-01: 4 mg via INTRAMUSCULAR
  Filled 2015-01-01: qty 1

## 2015-01-01 MED ORDER — LORAZEPAM 2 MG/ML IJ SOLN
1.0000 mg | Freq: Once | INTRAMUSCULAR | Status: AC
  Administered 2015-01-01: 1 mg via INTRAMUSCULAR
  Filled 2015-01-01: qty 1

## 2015-01-01 MED ORDER — OXYCODONE-ACETAMINOPHEN 5-325 MG PO TABS
2.0000 | ORAL_TABLET | Freq: Once | ORAL | Status: AC
  Administered 2015-01-01: 2 via ORAL
  Filled 2015-01-01: qty 2

## 2015-01-01 MED ORDER — MORPHINE SULFATE 4 MG/ML IJ SOLN: 4.0000 mg | Freq: Once | INTRAMUSCULAR | Status: DC

## 2015-01-01 NOTE — ED Provider Notes (Signed)
CSN: [id number]     Arrival date & time 01/01/15  1746 History   First MD Initiated Contact with Patient 01/01/15 1811     Chief Complaint  Patient presents with  . Urinary Retention  . Rash     (Consider location/radiation/quality/duration/timing/severity/associated sxs/prior Treatment) HPI [name] is a 34 y.o female with a history of HIV, seizures, anxiety, schizoaffective disorder, who presents for urinary retention that began early this morning. She said she was able to go around 8 AM but had difficulty emptying her bladder. By 11:30 am she was no longer able to pass urine. No treatment PTA. Nothing makes her symptom better or worse. She also states she has impetigo.  She denies any fever, chills, abdominal pain, nausea, constipation, dysuria, hematuria. Her last bowel movement was yesterday. No history of kidney disease or frequent UTI's. She states she has vaginal bleeding every 3-4 days on a regular basis.  She is not on birth control.  Past Medical History  Diagnosis Date  . HIV infection   . Anxiety   . Arthritis   . Chronic headaches   . Depression   . Gallstones   . Pneumonia   . Seizures    Past Surgical History  Procedure Laterality Date  . Cholecystectomy    . Fracture surgery      2-right hand  . Ureter surgery      Placement and removal    Family History  Problem Relation Age of Onset  . Diabetes Maternal Grandfather   . Heart disease Maternal Grandfather   . Seizures Neg Hx   . Breast cancer Mother    History  Substance Use Topics  . Smoking status: Current Every Day Smoker -- 1.00 packs/day for 20 years    Types: Cigarettes  . Smokeless tobacco: Never Used  . Alcohol Use: No   OB History    No data available     Review of Systems  Gastrointestinal: Negative for nausea and abdominal pain.  Genitourinary: Positive for difficulty urinating. Negative for dysuria, frequency, hematuria, flank pain and vaginal discharge.  Skin: Positive for rash.   All other systems reviewed and are negative.     Allergies  Review of patient's allergies indicates no known allergies.  Home Medications   Prior to Admission medications   Medication Sig Start Date End Date Taking? Authorizing Provider  buprenorphine (SUBUTEX) 8 MG SUBL SL tablet Place 4 mg under the tongue 4 (four) times daily.    Yes Historical Provider, MD  dolutegravir (TIVICAY) 50 MG tablet Take 1 tablet (50 mg total) by mouth daily. 10/08/14  Yes Randall Hiss, MD  glycopyrrolate (ROBINUL) 2 MG tablet Take 1 tablet (2 mg total) by mouth 3 (three) times daily. 12/29/14  Yes Louis Meckel, MD  lamivudine (EPIVIR) 300 MG tablet Take 1 tablet (300 mg total) by mouth daily. 10/08/14  Yes Randall Hiss, MD  levETIRAcetam (KEPPRA) 500 MG tablet Take 1 tablet (500 mg total) by mouth 2 (two) times daily. 12/08/14  Yes Anson Fret, MD  pantoprazole (PROTONIX) 40 MG tablet Take 1 tablet (40 mg total) by mouth daily. 12/29/14  Yes Louis Meckel, MD  traZODone (DESYREL) 100 MG tablet Take 200 mg by mouth at bedtime as needed for sleep.   Yes Historical Provider, MD  omeprazole (PRILOSEC) 40 MG capsule Take 1 capsule (40 mg total) by mouth daily. Patient not taking: Reported on 01/01/2015 12/26/14   Meredith Pel, NP  BP 116/73 mmHg  Pulse 62  Temp(Src) 98.1 F (36.7 C) (Oral)  Resp 20  SpO2 98% Physical Exam  Constitutional: She is oriented to person, place, and time. She appears well-developed and well-nourished.  HENT:  Head: Normocephalic and atraumatic.  Mouth/Throat: Oropharynx is clear and moist.  Eyes: Conjunctivae are normal.  Neck: Normal range of motion. Neck supple.  Cardiovascular: Normal rate, regular rhythm and normal heart sounds.   Pulmonary/Chest: Effort normal and breath sounds normal. No respiratory distress. She has no wheezes. She has no rales.  Abdominal: Soft. Normal appearance. She exhibits no distension. There is no tenderness. There is no  rebound, no guarding and no CVA tenderness.  Musculoskeletal: Normal range of motion.  Neurological: She is alert and oriented to person, place, and time. She has normal strength. No sensory deficit. GCS eye subscore is 4. GCS verbal subscore is 5. GCS motor subscore is 6.  Cranial nerves III-XII in tact. Reflexes could not be tested due to patients inability to relax legs from flexed position secondary to pain.   Skin: Skin is warm and dry. No rash noted.  No rash noted.   Psychiatric: She has a normal mood and affect. Her behavior is normal.  Nursing note and vitals reviewed.   ED Course  Procedures (including critical care time) Labs Review Labs Reviewed  URINALYSIS, ROUTINE W REFLEX MICROSCOPIC (NOT AT [facility]) - Abnormal; Notable for the following:    APPearance CLOUDY (*)    All other components within normal limits  CBC WITH DIFFERENTIAL/PLATELET - Abnormal; Notable for the following:    Eosinophils Relative 6 (*)    All other components within normal limits  BASIC METABOLIC PANEL  POC URINE PREG, ED    Imaging Review No results found.   EKG Interpretation None      MDM   Final diagnoses:  Urinary retention   Patient presents for urinary retention since this morning. Bladder scan shows greater than 800 mL of fluid. Foley catheter was placed and 750cc's recovered. Patient complained of severe pain with movement after foley was placed. She was given ativan, pyridium, and morphine IM.  She states she has chronic abdominal pain, nausea, and vomiting but is followed by both GI and infectious disease.  She states she had an endoscopy done 3 days ago which showed inflamed mucosa at the GE junction.  She was started on protonix 40mg  and she has a follow up in 4-6 weeks. She is also followed by neurology for neck pain and bilateral arm numbness.  She is also complaining of gait abnormality and running into things. She had a normal MRI brain recently. I spoke to Dr. Fayrene Fearing regarding  this patient who has seen and evaluated the patient. He recommended MRI of the entire spine.  8:00pm [facility] does not have MRI after 8pm, patient to be transferred to [facility].  I spoke to Dr. Juleen China regarding this patient. I also spoke to the charge nurse. Her vitals are stable and she is in no acute distress.       Catha Gosselin, PA-C 01/01/15 2152  Rolland Porter, MD 01/10/15 [phone]

## 2015-01-01 NOTE — ED Notes (Signed)
Pt remains in MRI 

## 2015-01-01 NOTE — Telephone Encounter (Signed)
Patient called requesting an appt because she is having difficulty urinating and pain. Advised her we have nothing available until the end of the month and advised her to go to the ED. Advised her that if she is unable to urinate, this can be dangerous and she may need to be catheterized. Wendall Mola

## 2015-01-01 NOTE — ED Notes (Signed)
This RN went to room to check on pt. Pt not in room.

## 2015-01-01 NOTE — ED Notes (Addendum)
Pt c/o urinary retention since 1130 and pain to roof of mouth and sores on arms and hands x almost a week.  Pain score 8/10.  Pt reports "I feel like I really need to pee, but cannot."  Pt believes she has impetigo.

## 2015-01-02 ENCOUNTER — Ambulatory Visit (HOSPITAL_COMMUNITY): Admit: 2015-01-02 | Payer: Medicare Other

## 2015-01-02 ENCOUNTER — Ambulatory Visit (HOSPITAL_COMMUNITY)
Admit: 2015-01-02 | Discharge: 2015-01-02 | Disposition: A | Discharge status: Home / Self Care | Payer: Medicare Other | Attending: Infectious Disease | Admitting: Infectious Disease

## 2015-01-02 ENCOUNTER — Encounter: Payer: Self-pay | Admitting: Gastroenterology

## 2015-01-02 DIAGNOSIS — R339 Retention of urine, unspecified: Secondary | ICD-10-CM | POA: Diagnosis not present

## 2015-01-02 MED ORDER — MORPHINE SULFATE 4 MG/ML IJ SOLN
4.0000 mg | Freq: Once | INTRAMUSCULAR | Status: AC
  Administered 2015-01-02: 4 mg via INTRAVENOUS
  Filled 2015-01-02: qty 1

## 2015-01-02 MED ORDER — MORPHINE SULFATE 4 MG/ML IJ SOLN
4.0000 mg | Freq: Once | INTRAMUSCULAR | Status: AC
  Administered 2015-01-02: 4 mg via INTRAMUSCULAR
  Filled 2015-01-02: qty 1

## 2015-01-02 MED ORDER — PERMETHRIN 5 % EX CREA: TOPICAL_CREAM | CUTANEOUS | Status: DC

## 2015-01-02 MED ORDER — ONDANSETRON HCL 4 MG/2ML IJ SOLN
4.0000 mg | Freq: Once | INTRAMUSCULAR | Status: AC
  Administered 2015-01-02: 4 mg via INTRAVENOUS
  Filled 2015-01-02: qty 2

## 2015-01-02 NOTE — ED Notes (Signed)
Pt transported back to MRI.  

## 2015-01-02 NOTE — ED Notes (Signed)
Pt remains out of room 

## 2015-01-02 NOTE — ED Notes (Signed)
Pt remains out of room at MRI.

## 2015-01-02 NOTE — Discharge Instructions (Signed)
Acute Urinary Retention Urinary retention means you are unable to pee completely or at all (empty your bladder). HOME CARE  Drink enough fluids to keep your pee (urine) clear or pale yellow.  If you are sent home with a tube that drains the bladder (catheter), there will be a drainage bag attached to it. There are two types of bags. One is big that you can wear at night without having to empty it. One is smaller and needs to be emptied more often.  Keep the drainage bag emptied.  Keep the drainage bag lower than the tube.  Only take medicine as told by your doctor. GET HELP IF:  You have a low-grade fever.  You have spasms or you are leaking pee when you have spasms. GET HELP RIGHT AWAY IF:   You have chills or a fever.  Your catheter stops draining pee.  Your catheter falls out.  You have increased bleeding that does not stop after you have rested and increased the amount of fluids you had been drinking. MAKE SURE YOU:   Understand these instructions.  Will watch your condition.  Will get help right away if you are not doing well or get worse. Document Released: 11/02/2007 Document Revised: 05/21/2013 Document Reviewed: 10/25/2012 [facility] Patient Information 2015 [place], [place]. This information is not intended to replace advice given to you by your health care provider. Make sure you discuss any questions you have with your health care provider.  Scabies Scabies are small bugs (mites) that burrow under the skin and cause red bumps and severe itching. These bugs can only be seen with a microscope. Scabies are highly contagious. They can spread easily from person to person by direct contact. They are also spread through sharing clothing or linens that have the scabies mites living in them. It is not unusual for an entire family to become infected through shared towels, clothing, or bedding.  HOME CARE INSTRUCTIONS   Your caregiver may prescribe a cream or lotion to kill the  mites. If cream is prescribed, massage the cream into the entire body from the neck to the bottom of both feet. Also massage the cream into the scalp and face if your child is less than 35 year old. Avoid the eyes and mouth. Do not wash your hands after application.  Leave the cream on for 8 to 12 hours. Your child should bathe or shower after the 8 to 12 hour application period. Sometimes it is helpful to apply the cream to your child right before bedtime.  One treatment is usually effective and will eliminate approximately 95% of infestations. For severe cases, your caregiver may decide to repeat the treatment in 1 week. Everyone in your household should be treated with one application of the cream.  New rashes or burrows should not appear within 24 to 48 hours after successful treatment. However, the itching and rash may last for 2 to 4 weeks after successful treatment. Your caregiver may prescribe a medicine to help with the itching or to help the rash go away more quickly.  Scabies can live on clothing or linens for up to 3 days. All of your child's recently used clothing, towels, stuffed toys, and bed linens should be washed in hot water and then dried in a dryer for at least 20 minutes on high heat. Items that cannot be washed should be enclosed in a plastic bag for at least 3 days.  To help relieve itching, bathe your child in a cool  bath or apply cool washcloths to the affected areas.  Your child may return to school after treatment with the prescribed cream. SEEK MEDICAL CARE IF:   The itching persists longer than 4 weeks after treatment.  The rash spreads or becomes infected. Signs of infection include red blisters or yellow-tan crust. Document Released: 05/16/2005 Document Revised: 08/08/2011 Document Reviewed: 09/24/2008 [facility] Patient Information 2015 [place], [place]. This information is not intended to replace advice given to you by your health care provider. Make sure you discuss  any questions you have with your health care provider.

## 2015-01-02 NOTE — ED Provider Notes (Signed)
Pt was transferred from [facility] for MRI imaging of back due to acute urinary retention.  MR imaging was very difficult to obtain (and took many hours) due to her inability to well tolerate the procedure.  Ultimately, there was no evidence of cord compromise.  She had a propofol sedation for an EGD the day before her urinary retention started, which could be the source of her retention.  She also complains of many other symptoms (which she says are being evaluated by several other specialists.)  However, she did ask me to specifically look at her rash.  She has mild papular rash to arms and hands, including some linear patterns and papules in webs of fingers.  Most likely scabies and will be treated for such. She was dc'd home with foley.  Advised urology follow up and pcp follow up.     Blake Divine, MD 01/02/15 [phone]

## 2015-01-02 NOTE — ED Notes (Signed)
Leg bag placed on pt , pt instructed on how to use , verbalized understanding

## 2015-01-02 NOTE — ED Notes (Signed)
This RN spoke with MRI, who states that the pt did not have any seizure-like activity while in the scanner, but states that while waiting for transport to bring her back to the ER, she was confused, and didn't know where she was.

## 2015-01-02 NOTE — ED Notes (Signed)
Pt states that while in MRI, she had a sudden onset of severe headache that caused her to be confused, and impair her memory. Pt reports being diagnosed with seizures recently, and her doctor is trying to find out why. Pt reports only a mild headache at this time, and no nausea. Pt is alert and oriented.

## 2015-01-02 NOTE — ED Notes (Signed)
Woffard MD at bedside.

## 2015-01-05 ENCOUNTER — Telehealth: Payer: Self-pay | Admitting: Gastroenterology

## 2015-01-05 ENCOUNTER — Encounter (INDEPENDENT_AMBULATORY_CARE_PROVIDER_SITE_OTHER): Payer: Medicare Other | Admitting: *Deleted

## 2015-01-05 ENCOUNTER — Ambulatory Visit: Payer: Medicare Other | Admitting: Neurology

## 2015-01-05 VITALS — BP 132/84 | HR 82 | Temp 98.5°F | Resp 16 | Wt 112.2 lb

## 2015-01-05 DIAGNOSIS — Z006 Encounter for examination for normal comparison and control in clinical research program: Secondary | ICD-10-CM

## 2015-01-05 NOTE — Telephone Encounter (Signed)
Patient states she developed a sore throat after her EGD and it has not gotten better. She feels it has gotten worse. She complains of pain with swallowing now. Appointment made per the patient request. She states she has spoken with her PCP and was instructed to contact us.

## 2015-01-06 ENCOUNTER — Telehealth: Payer: Self-pay | Admitting: *Deleted

## 2015-01-06 ENCOUNTER — Ambulatory Visit (INDEPENDENT_AMBULATORY_CARE_PROVIDER_SITE_OTHER): Payer: Medicare Other | Admitting: Physician Assistant

## 2015-01-06 ENCOUNTER — Encounter: Payer: Self-pay | Admitting: Physician Assistant

## 2015-01-06 ENCOUNTER — Encounter: Payer: Self-pay | Admitting: Neurology

## 2015-01-06 VITALS — BP 112/60 | HR 80 | Ht 65.75 in | Wt 112.4 lb

## 2015-01-06 DIAGNOSIS — R1310 Dysphagia, unspecified: Secondary | ICD-10-CM

## 2015-01-06 DIAGNOSIS — K219 Gastro-esophageal reflux disease without esophagitis: Secondary | ICD-10-CM | POA: Diagnosis not present

## 2015-01-06 DIAGNOSIS — R1013 Epigastric pain: Secondary | ICD-10-CM | POA: Diagnosis not present

## 2015-01-06 MED ORDER — NYSTATIN 100000 UNIT/ML MT SUSP: OROMUCOSAL | Status: DC

## 2015-01-06 NOTE — Progress Notes (Signed)
Patient ID: JADIN KAGEL, female   DOB: Jun 20, 1980, 33 y.o.   MRN: [id number]   Subjective:    Patient ID: [name], female    DOB: 12-18-1980, 34 y.o.   MRN: [id number]  HPI  [name] is a 34 year old white female recently known to Dr. Arlyce Dice who had been referred for complaints of epigastric pain and dysphagia. She has been undergoing numerous workups over the past couple of months for a multitude of symptoms. She has been diagnosed with HIV and is on retroviral therapy , with normal CD4 count. She also has history of drug abuse is being maintained on Subutex, also with anxiety PTSD schizoaffective disorder and is undergoing neurologic evaluation for new onset of seizures.  She underwent EGD on August 1 was found to have a slightly irregular GE junction otherwise negative exam. Biopsies were taken in the showed some mild chronic inflammation no metaplasia or dysplasia. She was started on Protonix 40 mg by mouth daily and also given Robinul Forte 3 times daily. She went to the emergency room on 85 with urinary retention and is now wearing a Foley catheter. There was also question of scabies and she says she treated herself. She still complains of difficulty swallowing and says she has a soreness up in the roof of her mouth in the back of her throat and is having frequent episodes of regurgitation. She also continues to complain of a constant pain tucked up under her ribs in the epigastrium and left upper quadrant which she says is been present for months and sometimes worse after eating.  She had undergone CT of the abdomen and pelvis in May 2016 which was negative.  She's also had a multitude of labs done including Lyme titer B-12 and folate levels thiamine level heavy metal screen all of which have been unremarkable.  She says she has sort of a numb tingly feeling on the tip of her tongue and also complains of tingling in her hands.  Review of Systems Pertinent positive and negative review  of systems were noted in the above HPI section.  All other review of systems was otherwise negative.  Outpatient Encounter Prescriptions as of 01/06/2015  Medication Sig  . buprenorphine (SUBUTEX) 8 MG SUBL SL tablet Place 4 mg under the tongue 4 (four) times daily.   . dolutegravir (TIVICAY) 50 MG tablet Take 1 tablet (50 mg total) by mouth daily.  Marland Kitchen glycopyrrolate (ROBINUL) 2 MG tablet Take 1 tablet (2 mg total) by mouth 3 (three) times daily.  Marland Kitchen lamivudine (EPIVIR) 300 MG tablet Take 1 tablet (300 mg total) by mouth daily.  Marland Kitchen levETIRAcetam (KEPPRA) 500 MG tablet Take 1 tablet (500 mg total) by mouth 2 (two) times daily.  . pantoprazole (PROTONIX) 40 MG tablet Take 1 tablet (40 mg total) by mouth daily.  . traZODone (DESYREL) 100 MG tablet Take 200 mg by mouth at bedtime as needed for sleep.  Marland Kitchen nystatin (MYCOSTATIN) 100000 UNIT/ML suspension Take 5 cc's and swish and swallow 4 times daily x 10 days  . [DISCONTINUED] omeprazole (PRILOSEC) 40 MG capsule Take 1 capsule (40 mg total) by mouth daily. (Patient not taking: Reported on 01/01/2015)  . [DISCONTINUED] permethrin (ELIMITE) 5 % cream Apply everywhere from neck down and leave on while you sleep for at lease 8 hours, then shower it off.   No facility-administered encounter medications on file as of 01/06/2015.   No Known Allergies Patient Active Problem List   Diagnosis Date Noted  .  Upper abdominal pain 12/26/2014  . Dysphagia 12/26/2014  . Loss of weight 12/26/2014  . Fatigue 12/24/2014  . Paresthesias 12/24/2014  . Convulsions/seizures 12/03/2014  . Abdominal pain, left upper quadrant 10/08/2014  . HIV disease 08/28/2014  . History of intravenous drug use in remission 08/28/2014  . Schizoaffective disorder 08/20/2014  . Depression 08/20/2014  . Intermittent explosive disorder 08/20/2014  . Seizures 05/01/2014   History   Social History  . Marital Status: Married    Spouse Name: Jonny Ruiz  . Number of Children: 2  . Years of  Education: 12+   Occupational History  . Unemployed    Social History Main Topics  . Smoking status: Current Every Day Smoker -- 1.00 packs/day for 20 years    Types: Cigarettes  . Smokeless tobacco: Never Used  . Alcohol Use: No  . Drug Use: 7.00 per week    Special: Marijuana, Heroin  . Sexual Activity: Not on file   Other Topics Concern  . Not on file   Social History Narrative   Lives at home with kids   Caffeine use: Drinks coffee (1 cup per day)    [name] family history includes Breast cancer in her mother; Diabetes in her maternal grandfather; Heart disease in her maternal grandfather. There is no history of Seizures.      Objective:    Filed Vitals:   01/06/15 1510  BP: 112/60  Pulse: 80    Physical Exam   Well-developed thin white female in no acute distress blood pressure 112/60 pulse 80 height 5 foot 5 weight 112. HEENT ;nontraumatic normocephalic EOMI PERRLA sclera anicteric oropharynx is clear ,I see no evidence of candidiasis slightly prominent papillae on the tip of her tongue , Cardiovascular ;regular rate and rhythm with S1-S2 no murmur or gallop, Pulmonary ;clear bilaterally, Abdomen; flat soft under no palpable mass or hepatosplenomegaly bowel sounds are present , Rectal; exam not done, Extremities; no clubbing cyanosis or edema skin warm and dry, Neuropsych mood and affect appropriate       Assessment & Plan:   #1 34 yo female with HIV with persistent c/o dysphagia and epigastric pain"under ribs". Negative w/u thus far -mild chronic inflammation on distal esophageal bx  Does not explain her sxs. Now with urinary retention, new seizures  ? Other neurologic disease Will r/o esophageal motility disorder  B6 and folate low normal #2 s/p GB # 3 other problems as outlined  Plan; Stop robinul as may contribute to urinary retention Continue Protonix 40 mg po qam  Schedule for barium swallow Empiric Mycostatin 5 cc swish and swallow  QID x 10  days    Kwesi Sangha Oswald Hillock PA-C 01/06/2015   Cc: Daiva Eves, Lisette Grinder, MD

## 2015-01-06 NOTE — Patient Instructions (Signed)
Continue the Protonix 40 mg, 1 tab daily. Stop the Robinul Forte.  Start taking a B Complex vitamin daily.  You have been scheduled for a Barium Esophogram at [facility] (1st floor of the hospital) on Friday 01-09-2015 at 9:30 am . Please arrive at 9:15  prior to your appointment for registration. Make certain not to have anything to eat or drink 3 hours prior to your test. If you need to reschedule for any reason, please contact radiology at [phone] to do so. __________________________________________________________________ A barium swallow is an examination that concentrates on views of the esophagus. This tends to be a double contrast exam (barium and two liquids which, when combined, create a gas to distend the wall of the oesophagus) or single contrast (non-ionic iodine based). The study is usually tailored to your symptoms so a good history is essential. Attention is paid during the study to the form, structure and configuration of the esophagus, looking for functional disorders (such as aspiration, dysphagia, achalasia, motility and reflux) EXAMINATION You may be asked to change into a gown, depending on the type of swallow being performed. A radiologist and radiographer will perform the procedure. The radiologist will advise you of the type of contrast selected for your procedure and direct you during the exam. You will be asked to stand, sit or lie in several different positions and to hold a small amount of fluid in your mouth before being asked to swallow while the imaging is performed .In some instances you may be asked to swallow barium coated marshmallows to assess the motility of a solid food bolus. The exam can be recorded as a digital or video fluoroscopy procedure. POST PROCEDURE It will take 1-2 days for the barium to pass through your system. To facilitate this, it is important, unless otherwise directed, to increase your fluids for the next 24-48hrs and to resume your  normal diet.  This test typically takes about 30 minutes to perform. __________________________________________________________________________________

## 2015-01-06 NOTE — Telephone Encounter (Signed)
Spoke w/ Candy from [facility] and she stated the Porphyr. Fractionation, Plasma lab that was still pending was sent to [facility] (outside facility). There typical turn around for results is 7-10 business days. She is aware it has been longer than this. The lab was sent to them on 12/25/14. She advised the results should be ready on 01/08/15 by the end of the day. She is going to fax the preliminary report to [phone].   Received preliminary report at 9:55am.

## 2015-01-06 NOTE — Progress Notes (Signed)
[name] is here today for her week 16 A5353 study visit. She has a foley catheter in that was inserted Friday in the ED. She had been having urinary retention since an Endo done on 8/1. She is waiting to go see a urologist. She had some inflamed areas on her endo and has been started on protonix and robinul. She has also been complaining of trouble swallowing as well as upper abdominal pain. And was recently evaluated for seizure like activity. She says she hasn't missed any doses of meds and is 100% adherent by pill counts. She will return in 4 weeks for the next study visit.

## 2015-01-07 NOTE — Progress Notes (Signed)
Reviewed and agree with management. Robert D. Kaplan, M.D., FACG  

## 2015-01-08 ENCOUNTER — Other Ambulatory Visit: Payer: Self-pay | Admitting: Neurology

## 2015-01-09 ENCOUNTER — Ambulatory Visit (HOSPITAL_COMMUNITY): Admission: RE | Admit: 2015-01-09 | Payer: Medicare Other | Source: Ambulatory Visit

## 2015-01-09 ENCOUNTER — Encounter: Payer: Self-pay | Admitting: Infectious Disease

## 2015-01-09 LAB — CD4/CD8 (T-HELPER/T-SUPPRESSOR CELL)
CD4%: 39.1
CD4: 1017
CD8 % Suppressor T Cell: 40.9
CD8: 1063

## 2015-01-09 LAB — HIV-1 RNA QUANT-NO REFLEX-BLD

## 2015-01-11 NOTE — Telephone Encounter (Signed)
[name] - I still don't have these results. Can you follow up on them please?

## 2015-01-12 ENCOUNTER — Ambulatory Visit (HOSPITAL_COMMUNITY)
Admission: RE | Admit: 2015-01-12 | Discharge: 2015-01-12 | Disposition: A | Discharge status: Home / Self Care | Payer: Medicare Other | Source: Ambulatory Visit | Attending: Physician Assistant | Admitting: Physician Assistant

## 2015-01-12 DIAGNOSIS — K449 Diaphragmatic hernia without obstruction or gangrene: Secondary | ICD-10-CM | POA: Diagnosis not present

## 2015-01-12 DIAGNOSIS — R1013 Epigastric pain: Secondary | ICD-10-CM | POA: Diagnosis present

## 2015-01-12 DIAGNOSIS — K219 Gastro-esophageal reflux disease without esophagitis: Secondary | ICD-10-CM | POA: Diagnosis not present

## 2015-01-12 DIAGNOSIS — R1310 Dysphagia, unspecified: Secondary | ICD-10-CM

## 2015-01-12 NOTE — Telephone Encounter (Signed)
Called and spoke to [name] at [facility] about porphyr. Fractionation, plasma lab that was still pending. She called [facility] where the lab was sent out and they stated the first test failed and they needed to re run the lab. They are redoing the lab this Wed. And should have the results Thursday. They confirmed fax number to send results to [place].   [place] phone #: [phone].

## 2015-01-13 ENCOUNTER — Other Ambulatory Visit: Payer: Self-pay

## 2015-01-13 ENCOUNTER — Other Ambulatory Visit: Payer: Medicare Other

## 2015-01-13 DIAGNOSIS — R1084 Generalized abdominal pain: Secondary | ICD-10-CM

## 2015-01-14 LAB — CELIAC PANEL 10
Endomysial Screen: NEGATIVE
GLIADIN IGA: 2 U (ref <20)
GLIADIN IGG: 7 U (ref <20)
IgA: 97 mg/dL (ref 69–380)
Tissue Transglut Ab: 1 U/mL (ref <6)
Tissue Transglutaminase Ab, IgA: 1 U/mL (ref <4)

## 2015-01-15 ENCOUNTER — Emergency Department (HOSPITAL_COMMUNITY)
Admission: EM | Admit: 2015-01-15 | Discharge: 2015-01-15 | Disposition: A | Discharge status: Home / Self Care | Payer: Medicare Other | Attending: Emergency Medicine | Admitting: Emergency Medicine

## 2015-01-15 ENCOUNTER — Encounter (HOSPITAL_COMMUNITY): Payer: Self-pay

## 2015-01-15 DIAGNOSIS — M1990 Unspecified osteoarthritis, unspecified site: Secondary | ICD-10-CM | POA: Insufficient documentation

## 2015-01-15 DIAGNOSIS — R110 Nausea: Secondary | ICD-10-CM | POA: Insufficient documentation

## 2015-01-15 DIAGNOSIS — G43909 Migraine, unspecified, not intractable, without status migrainosus: Secondary | ICD-10-CM | POA: Insufficient documentation

## 2015-01-15 DIAGNOSIS — F419 Anxiety disorder, unspecified: Secondary | ICD-10-CM | POA: Diagnosis not present

## 2015-01-15 DIAGNOSIS — F329 Major depressive disorder, single episode, unspecified: Secondary | ICD-10-CM | POA: Diagnosis not present

## 2015-01-15 DIAGNOSIS — Z79899 Other long term (current) drug therapy: Secondary | ICD-10-CM | POA: Diagnosis not present

## 2015-01-15 DIAGNOSIS — Z720 Tobacco use: Secondary | ICD-10-CM | POA: Diagnosis not present

## 2015-01-15 DIAGNOSIS — R109 Unspecified abdominal pain: Secondary | ICD-10-CM | POA: Diagnosis not present

## 2015-01-15 DIAGNOSIS — G8929 Other chronic pain: Secondary | ICD-10-CM | POA: Insufficient documentation

## 2015-01-15 DIAGNOSIS — Z21 Asymptomatic human immunodeficiency virus [HIV] infection status: Secondary | ICD-10-CM | POA: Diagnosis not present

## 2015-01-15 DIAGNOSIS — Z466 Encounter for fitting and adjustment of urinary device: Secondary | ICD-10-CM | POA: Diagnosis present

## 2015-01-15 DIAGNOSIS — Z8701 Personal history of pneumonia (recurrent): Secondary | ICD-10-CM | POA: Diagnosis not present

## 2015-01-15 LAB — URINALYSIS, ROUTINE W REFLEX MICROSCOPIC
BILIRUBIN URINE: NEGATIVE
GLUCOSE, UA: NEGATIVE mg/dL
Hgb urine dipstick: NEGATIVE
Ketones, ur: NEGATIVE mg/dL
Nitrite: NEGATIVE
PH: 6.5 (ref 5.0–8.0)
Protein, ur: NEGATIVE mg/dL
SPECIFIC GRAVITY, URINE: 1.006 (ref 1.005–1.030)
UROBILINOGEN UA: 0.2 mg/dL (ref 0.0–1.0)

## 2015-01-15 LAB — URINE MICROSCOPIC-ADD ON

## 2015-01-15 NOTE — Discharge Instructions (Signed)
FOLLOW UP WITH YOUR DOCTOR AS NEEDED AND RETURN TO THE EMERGENCY DEPARTMENT WITH ANY FURTHER RETENTION ISSUES.

## 2015-01-15 NOTE — ED Notes (Addendum)
Pt presents for foley cath removal.  C/o bilateral flank and abdominal pain x 4 days.  Pain score 6/10.  Pt sts "it feels like a tearing pain in my stomach."   Pt had catheter placed on 8/4.     Pt reports he has a Urology appointment on 8/25.

## 2015-01-15 NOTE — ED Notes (Signed)
Patient stated that she was able to urinate when she went to the bathroom.  I made the nurse aware.

## 2015-01-15 NOTE — ED Provider Notes (Signed)
CSN: [id number]     Arrival date & time 01/15/15  1430 History   First MD Initiated Contact with Patient 01/15/15 1628     Chief Complaint  Patient presents with  . Foley Cath Removal    . Flank Pain  . Abdominal Pain     (Consider location/radiation/quality/duration/timing/severity/associated sxs/prior Treatment) Patient is a 34 y.o. female presenting with flank pain and abdominal pain. The history is provided by the patient. No language interpreter was used.  Flank Pain The current episode started 1 to 4 weeks ago. Associated symptoms include abdominal pain and nausea. Pertinent negatives include no chest pain, chills, coughing, fever, myalgias, vomiting or weakness. Associated symptoms comments: Patient returns to the emergency room after being seen 01/01/15 for urinary retention. A foley cath was necessary and remains in place. At that time, she reported bilateral flank pain and sharp, shooting suprapubic pain after foley placement. She returns now with persistent, unchanged symptoms of flank and suprapubic pain. No fever. She reports nausea without vomiting. She has a history of HIV with reported normal CD4 counts, on retrovirals and compliant with medications. She was discharged with referral for urology follow up and has an appointment on 8/25 but is requesting the foley be removed now because she is tired of the pain it is causing. .  Abdominal Pain Associated symptoms: nausea   Associated symptoms: no chest pain, no chills, no cough, no fever and no vomiting     Past Medical History  Diagnosis Date  . HIV infection   . Anxiety   . Arthritis   . Chronic headaches   . Depression   . Gallstones   . Pneumonia   . Seizures    Past Surgical History  Procedure Laterality Date  . Cholecystectomy    . Fracture surgery      2-right hand  . Ureter surgery      Placement and removal    Family History  Problem Relation Age of Onset  . Diabetes Maternal Grandfather   . Heart  disease Maternal Grandfather   . Seizures Neg Hx   . Breast cancer Mother    Social History  Substance Use Topics  . Smoking status: Current Every Day Smoker -- 1.00 packs/day for 20 years    Types: Cigarettes  . Smokeless tobacco: Never Used  . Alcohol Use: No   OB History    No data available     Review of Systems  Constitutional: Negative for fever and chills.  Respiratory: Negative.  Negative for cough.   Cardiovascular: Negative.  Negative for chest pain.  Gastrointestinal: Positive for nausea and abdominal pain. Negative for vomiting.  Genitourinary: Positive for flank pain.  Musculoskeletal: Negative.  Negative for myalgias.  Neurological: Negative.  Negative for seizures and weakness.      Allergies  Review of patient's allergies indicates no known allergies.  Home Medications   Prior to Admission medications   Medication Sig Start Date End Date Taking? Authorizing Provider  buprenorphine (SUBUTEX) 8 MG SUBL SL tablet Place 4 mg under the tongue 4 (four) times daily.     Historical Provider, MD  dolutegravir (TIVICAY) 50 MG tablet Take 1 tablet (50 mg total) by mouth daily. 10/08/14   Randall Hiss, MD  glycopyrrolate (ROBINUL) 2 MG tablet Take 1 tablet (2 mg total) by mouth 3 (three) times daily. 12/29/14   Louis Meckel, MD  lamivudine (EPIVIR) 300 MG tablet Take 1 tablet (300 mg total) by mouth daily. 10/08/14  Randall Hiss, MD  levETIRAcetam (KEPPRA) 500 MG tablet Take 1 tablet (500 mg total) by mouth 2 (two) times daily. 12/08/14   Anson Fret, MD  nystatin (MYCOSTATIN) 100000 UNIT/ML suspension Take 5 cc's and swish and swallow 4 times daily x 10 days 01/06/15   Amy S Esterwood, PA-C  pantoprazole (PROTONIX) 40 MG tablet Take 1 tablet (40 mg total) by mouth daily. 12/29/14   Louis Meckel, MD  traZODone (DESYREL) 100 MG tablet Take 200 mg by mouth at bedtime as needed for sleep.    Historical Provider, MD   BP 111/45 mmHg  Pulse 60  Temp(Src)  98.2 F (36.8 C) (Oral)  Resp 18  SpO2 98% Physical Exam  Constitutional: She is oriented to person, place, and time. She appears well-developed and well-nourished.  HENT:  Head: Normocephalic.  Neck: Normal range of motion. Neck supple.  Cardiovascular: Normal rate and regular rhythm.   Pulmonary/Chest: Effort normal and breath sounds normal.  Abdominal: Soft. Bowel sounds are normal. There is no tenderness. There is no rebound and no guarding.  Musculoskeletal: Normal range of motion.  Neurological: She is alert and oriented to person, place, and time.  Skin: Skin is warm and dry. No rash noted.  Psychiatric: She has a normal mood and affect.    ED Course  Procedures (including critical care time) Labs Review Labs Reviewed  URINE CULTURE  URINALYSIS, ROUTINE W REFLEX MICROSCOPIC (NOT AT [facility])    Imaging Review No results found. I have personally reviewed and evaluated these images and lab results as part of my medical decision-making.   EKG Interpretation None      MDM   Final diagnoses:  None    1. Foley catheter removal  Foley cath removed at patient's request. She voided after the catheter was removed. No UTI on lab study. She feels much better, pain improved. VSS. She can be discharged home with PCP follow up as needed.    Elpidio Anis, PA-C 01/16/15 0113  Laurence Spates, MD 01/16/15 [phone]

## 2015-01-15 NOTE — Telephone Encounter (Signed)
Spoke with Rob from [facility]. He stated they had an error retesting yesterday and it had to be redone. Results ready tomorrow.  He will put results in [facility] and also fax copy to [phone] with ATTN to Dr. Lucia Gaskins.

## 2015-01-17 LAB — URINE CULTURE

## 2015-01-19 NOTE — Telephone Encounter (Signed)
Called and made f/u appt with pt tomorrow at 9am for check in at 8:45am. Pt verbalized understanding.

## 2015-01-19 NOTE — Telephone Encounter (Signed)
Can you call and schedule 30 minute appt with patient please?

## 2015-01-20 ENCOUNTER — Ambulatory Visit (INDEPENDENT_AMBULATORY_CARE_PROVIDER_SITE_OTHER): Payer: Medicare Other | Admitting: Neurology

## 2015-01-20 ENCOUNTER — Encounter: Payer: Self-pay | Admitting: Neurology

## 2015-01-20 VITALS — BP 111/70 | HR 64 | Ht 67.5 in | Wt 115.4 lb

## 2015-01-20 DIAGNOSIS — R569 Unspecified convulsions: Secondary | ICD-10-CM

## 2015-01-20 LAB — PORPHYRINS, FRACTIONATED, RANDOM URINE
COPROPORPHYRIN (CP) I: 49 ug/L — AB (ref 0–15)
Coproporphyrin (CP) III: 61 ug/L — ABNORMAL HIGH (ref 0–49)
Heptacarboxyl (7-CP): 4 ug/L — ABNORMAL HIGH (ref 0–2)
Hexacarboxyl (6-CP): <1 ug/L (ref 0–1)
Uroporphyrins (UP): 15 ug/L (ref 0–20)

## 2015-01-20 LAB — VITAMIN B6: Vitamin B6: 3.2 ug/L (ref 2.0–32.8)

## 2015-01-20 LAB — B. BURGDORFI ANTIBODIES: Lyme IgG/IgM Ab: <0.91 {ISR} (ref 0.00–0.90)

## 2015-01-20 LAB — PORPHYRINS, FRACTIONATION-PLASMA
Coproporphyrin.: <1 ug/dL (ref 0.0–1.0)
Heptacarboxyl Porphyrins: <1 ug/dL (ref 0.0–1.0)
Protoporphyrin: <1 ug/dL (ref 0.0–1.0)
Uroporphyrin: <1 ug/dL (ref 0.0–1.0)

## 2015-01-20 LAB — TSH: TSH: 1.59 u[IU]/mL (ref 0.450–4.500)

## 2015-01-20 LAB — VITAMIN B1: Thiamine: 122.4 nmol/L (ref 66.5–200.0)

## 2015-01-20 LAB — HEMOGLOBIN A1C
Est. average glucose Bld gHb Est-mCnc: 114 mg/dL
Hgb A1c MFr Bld: 5.6 % (ref 4.8–5.6)

## 2015-01-20 LAB — MAGNESIUM: MAGNESIUM: 2 mg/dL (ref 1.6–2.3)

## 2015-01-20 LAB — B12 AND FOLATE PANEL
Folate: 3 ng/mL — ABNORMAL LOW (ref >3.0)
VITAMIN B 12: 222 pg/mL (ref 211–946)

## 2015-01-20 LAB — HEAVY METALS, BLOOD
Arsenic: 5 ug/L (ref 2–23)
LEAD, BLOOD: 2 ug/dL (ref 0–19)
Mercury: NOT DETECTED ug/L (ref 0.0–14.9)

## 2015-01-20 LAB — METHYLMALONIC ACID, SERUM: METHYLMALONIC ACID: 239 nmol/L (ref 0–378)

## 2015-01-20 NOTE — Progress Notes (Signed)
[name]    CC: seizures  Interval update: [name] is a 34 y.o. female here as a referral from Dr. Daiva Eves for seizures. She has a long history of polysubstance abuse and mental Health history of depression, anxiety disorder, PTSD, agoraphobia and Schizoaffective disorder per patient. She is HIV positive. She reports multiple seizure-like events. Her first episode was October of last year. She was started on Keppra after an EEG that suggested a lowered seizure threshole. She is having staring spells, she is having autonomic phenomena where she feels like something in the middle of her chest is rising, she gets flushed and hot, hot from the inside like she is cooking from the inside out. It is quick. She can feel it coming. She has it every few days. 2 days ago was the last, brief. Worse with stress. The staring spells happen but can't tell me how often, kids notice it. She twitches a lot. No loss of consciousness since the keppra. Her throat is better, she is urinating. The rash resolved.   Interval update: She Is here to discuss eeg. Reviewed with her. She is on the seizure medication and doing well. She has not been feeling well. Has a new issue, paresthesias in the limbs. She has abdominal pain they cannot figure out. She is very distressed, thinks there is something wrong. She is chronically fatigued, has a tremor, has hold/heat intolerance.   EEG: This is an abnormal EEG recording secondary to dysrhythmic theta activity and sharp transients emanating from the left temporal region. This study suggests a lowered seizure threshold with a left brain focus. No electrographic seizures were seen.  HPI: [name] is a 34 y.o. female here as a referral from Dr. Daiva Eves for seizures. She has a long history of polysubstance abuse and mental Health history of depression, anxiety disorder, PTSD, agoraphobia and Schizoaffective disorder per patient. She is HIV  positive. She reports multiple seizure-like events. Her first episode was October of last year. She was driving and then the next thing she knew, she was in the back of an ambulance. She has no idea what happened. Then at Thanksgiving it happened again, she was sitting there watching TV and then she found herself on the floor. She is a very poor historian. She doesn't know what happens, she just loses consciousness. Her husband says she had another episode march 11th of this year, he was in the shower and he heard a noise, when he came out she was shaking and her eyes were closed, lasted at least 10 minutes. Described by husband as shaking, she starts convulsing and smacking her head on the floor. Her head is going side to side like she is shaking her head "no -no". She is confused afterwards, she doesn't know anything for 20 minutes, doesn't even know the year. She is barely breathing during the events which can last longer than even 15 minutes. She has bitten her tongue twice. No urination. She cannot follow commands during the event. She cannot answer questions during the event. She denies any current use of any illegal substance other than marijuana and no alcohol use or withdrawal. She reports smacking her head on the floor in a "no-no" pattern. She is under a lot stress, she is having marital problems. No inciting factors, no head trauma. No aura. No headache. She is confused after every episode and is also tired. No FHx of seizures or seizures as a child. No focal neurologic deficits.  Reviewed notes, labs and imaging from outside physicians, which showed:   Recent cbc and cmp unremarkable. Urine drug screen +THC. HIV RNA Viral Load < 40.    CT 11/24/2014: showed No acute intracranial abnormalities including mass lesion or mass effect, hydrocephalus, extra-axial fluid collection, midline shift, hemorrhage, or acute infarction, large ischemic events (personally reviewed images)  6/27: EMS sent to home  for seizure. No tongue biting or urination. Per friend, 15 minute LOC with generalized body shaking and patient's head repeatedly hitting a wall. Patient with closed eyes. When she came to she was confused to person and event as well as place. No loss of control of bladder or bowel. No tongue biting. Reviewed notes back to 2013 and do not see any other ED visits for seizure-like activity.   Review of Systems: Patient complains of symptoms per HPI as well as the following symptoms: blurred vision, trouble swallowing, choking, chest tightness, heat intolerance, flushing, abdominal pain, rectal bleeding, nausea, vomiting, insomnia, frequent wakening, back pain, aching muscles, neck pain, dizziness, numbnmess, depression and anxiety. Pertinent negatives per HPI. All others negative.   Social History   Social History  . Marital Status: Married    Spouse Name: Jonny Ruiz  . Number of Children: 2  . Years of Education: 12+   Occupational History  . Unemployed    Social History Main Topics  . Smoking status: Current Every Day Smoker -- 1.00 packs/day for 20 years    Types: Cigarettes  . Smokeless tobacco: Never Used  . Alcohol Use: No  . Drug Use: 7.00 per week    Special: Marijuana, Heroin  . Sexual Activity: Not on file   Other Topics Concern  . Not on file   Social History Narrative   Lives at home with kids   Caffeine use: Drinks coffee (1 cup per day)    Family History  Problem Relation Age of Onset  . Diabetes Maternal Grandfather   . Heart disease Maternal Grandfather   . Seizures Neg Hx   . Breast cancer Mother     Past Medical History  Diagnosis Date  . HIV infection   . Anxiety   . Arthritis   . Chronic headaches   . Depression   . Gallstones   . Pneumonia   . Seizures     Past Surgical History  Procedure Laterality Date  . Cholecystectomy    . Fracture surgery      2-right hand  . Ureter surgery      Placement and removal     Current Outpatient  Prescriptions  Medication Sig Dispense Refill  . buprenorphine (SUBUTEX) 8 MG SUBL SL tablet Place 4 mg under the tongue 4 (four) times daily.     . dolutegravir (TIVICAY) 50 MG tablet Take 1 tablet (50 mg total) by mouth daily. 30 tablet 11  . lamivudine (EPIVIR) 300 MG tablet Take 1 tablet (300 mg total) by mouth daily. 30 tablet 11  . levETIRAcetam (KEPPRA) 500 MG tablet Take 1 tablet (500 mg total) by mouth 2 (two) times daily. 60 tablet 6  . pantoprazole (PROTONIX) 40 MG tablet Take 1 tablet (40 mg total) by mouth daily. 90 tablet 3  . traZODone (DESYREL) 100 MG tablet Take 200 mg by mouth at bedtime as needed for sleep.    Marland Kitchen glycopyrrolate (ROBINUL) 2 MG tablet Take 1 tablet (2 mg total) by mouth 3 (three) times daily. (Patient not taking: Reported on 01/20/2015) 90 tablet 3  . nystatin (MYCOSTATIN) 100000  UNIT/ML suspension Take 5 cc's and swish and swallow 4 times daily x 10 days (Patient not taking: Reported on 01/20/2015) 200 mL 1   No current facility-administered medications for this visit.    Allergies as of 01/20/2015  . (No Known Allergies)    Vitals: BP 111/70 mmHg  Pulse 64  Ht 5' 7.5" (1.715 m)  Wt 115 lb 6.4 oz (52.345 kg)  BMI 17.80 kg/m2 Last Weight:  Wt Readings from Last 1 Encounters:  01/20/15 115 lb 6.4 oz (52.345 kg)   Last Height:   Ht Readings from Last 1 Encounters:  01/20/15 5' 7.5" (1.715 m)    Physical exam: Exam: Gen: NAD, conversant, well nourised, obese, well groomed                     CV: RRR, no MRG. No Carotid Bruits. No peripheral edema, warm, nontender Eyes: Conjunctivae clear without exudates or hemorrhage  Neuro: Detailed Neurologic Exam  Speech:    Speech is normal; fluent and spontaneous with normal comprehension.  Cognition:    The patient is oriented to person, place, and time;     recent and remote memory intact;     language fluent;     normal attention, concentration,     fund of knowledge Cranial Nerves:    The pupils  are equal, round, and reactive to light. TVisual fields are full to finger confrontation. Extraocular movements are intact. Trigeminal sensation is intact and the muscles of mastication are normal. The face is symmetric. The palate elevates in the midline. Hearing intact. Voice is normal. Shoulder shrug is normal. The tongue has normal motion without fasciculations.   Motor Observation:    No asymmetry, no atrophy, and no involuntary movements noted. Tone:    Normal muscle tone.      Strength:    Strength is V/V in the upper and lower limbs.        Assessment/Plan: 35 y.o. female here as a referral from Dr. Daiva Eves for seizures. She has a long history of polysubstance abuse and mental Health history of depression, anxiety disorder, PTSD, agoraphobia and Schizoaffective disorder per patient. She is HIV positive. She reports multiple fseizure-like events. Some of the features are not consistent with seizures such as length of 15 minutes or more, shaking her head in a "no-no "pattern, repeatedly banging her head on the wall.but EEG was abnormal so started keppra. Today she has other complaints, chronic fatigue, paresthesias, abdominal pain, staring spells, jerking movements, autonomic phenomena.  EEG - abnormal MRI brain unremarkable 3-day ambulatory EEG monitoring continue keppra.  No driving until 6 months seizure free, no bathing or swimming alone or anything that could hurt patient or others should he have a seizure. Patient states that she has been told this before an dunderstands.  Naomie Dean, MD  [facility] [address] [place], [place] [address]  Phone [phone] Fax [phone]  A total of 30 minutes was spent face-to-face with this patient. Over half this time was spent on counseling patient on the seizure diagnosis and different diagnostic and therapeutic options available.

## 2015-01-20 NOTE — Progress Notes (Signed)
Faxed completed Neurodiagnostics video EEG certificate of Necessity to [facility] at [phone]. Received fax confirmation. Was for 72hr ambulatory EEG with digital seizure and spike analysis. Copy of completed form sent to medical records.

## 2015-01-22 ENCOUNTER — Encounter: Payer: Self-pay | Admitting: *Deleted

## 2015-01-22 NOTE — Progress Notes (Signed)
Received physician referral status notification from [facility] that ambulatory EEG is being processed and once pt is scheduled, they will update our office with another status notification. Phone to contact with more questions: [phone]. Copy sent to medical records and copy given to Dr. Lucia Gaskins.

## 2015-01-24 ENCOUNTER — Telehealth: Payer: Self-pay | Admitting: Neurology

## 2015-01-24 NOTE — Telephone Encounter (Signed)
Spoke to patient. Plasma porphyrins normal. Coproporphyrin predominates in normal urine. An isolated mild increase in urine coproporphyrin very nonspecific, because increases in urine porphyrins and especially coproporphyrin are common in many medical conditions. Porphyria is unlikely cause of abdominal pain and rash (later diagnosed as scabies).

## 2015-01-28 ENCOUNTER — Encounter: Payer: Self-pay | Admitting: Infectious Disease

## 2015-01-28 LAB — HIV-1 RNA QUANT-NO REFLEX-BLD: HIV-1 RNA Viral Load: <40

## 2015-01-29 ENCOUNTER — Encounter (INDEPENDENT_AMBULATORY_CARE_PROVIDER_SITE_OTHER): Payer: Self-pay | Admitting: *Deleted

## 2015-01-29 VITALS — BP 107/68 | HR 60 | Temp 98.3°F | Resp 16 | Wt 110.2 lb

## 2015-01-29 DIAGNOSIS — Z006 Encounter for examination for normal comparison and control in clinical research program: Secondary | ICD-10-CM

## 2015-01-29 NOTE — Progress Notes (Signed)
[name] is here for her week 20 visit for A5353, a Study to Evaluate Dolutegravir and lamivudine Dual Therapy for the Treatment of Naive HIV-1 Infected Participants. She hasn't missed any doses of meds and doesn't have any side effects from them. She continues to have multiple sx, most concerning is difficulty eating and she has lost 2 more pounds. She says she has been trying to drink shakes and adding ice cream, bananas etc to get nourishment. Many other foods upset her stomach. Has been able to void since her catheter was removed on 8/19 and urology started her on flomax a few days ago. She has also seen her neurologist and they are planning on getting a 72 hour EEG to evaluate for seizures. She has been accepted into the [facility] so will now have a regular primary care doctor. She returns for study in 4 weeks.

## 2015-02-09 ENCOUNTER — Encounter: Payer: Self-pay | Admitting: Neurology

## 2015-02-09 ENCOUNTER — Ambulatory Visit (INDEPENDENT_AMBULATORY_CARE_PROVIDER_SITE_OTHER): Payer: Medicare Other | Admitting: Neurology

## 2015-02-09 VITALS — BP 110/62 | HR 67 | Ht 67.5 in | Wt 112.0 lb

## 2015-02-09 DIAGNOSIS — R569 Unspecified convulsions: Secondary | ICD-10-CM

## 2015-02-09 DIAGNOSIS — B20 Human immunodeficiency virus [HIV] disease: Secondary | ICD-10-CM | POA: Diagnosis not present

## 2015-02-09 NOTE — Progress Notes (Signed)
[facility]    Provider:  Dr Lucia Gaskins Referring Provider: Daiva Eves, Lisette Grinder, MD Primary Care Physician:  Acey Lav, MD  CC: seizures  Interval update 02/09/2015: She had ambulatory EEG but results are not available. She is still having the weird feelings. She rolls her own cigarettes and she feels like she has less fine-motor coordination. She is having blurry vision without headaches, recommended seeing an eye doctor. She has abdominal pain and is following with other doctors regarding this. She sees Dr. Arlyce Dice. She has had a thorough workup on this. She has been diagnosed with HIV and is on retroviral therapy , with normal CD4 count. 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 She wakes up with numb hands, has been followed in the past with CTS sugery and recommended f/u with her hand surgeon.   Interval update: [name] is a 34 y.o. female here as a referral from Dr. Daiva Eves for seizures. She has a long history of polysubstance abuse and mental Health history of depression, anxiety disorder, PTSD, agoraphobia and Schizoaffective disorder per patient. She is HIV positive. She reports multiple seizure-like events. Her first episode was October of last year. She was started on Keppra after an EEG that suggested a lowered seizure threshole. She is having staring spells, she is having autonomic phenomena where she feels like something in the middle of her chest is rising, she gets flushed and hot, hot from the inside like she is cooking from the inside out. It is quick. She can feel it coming. She has it every few days. 2 days ago was the last, brief. Worse with stress. The staring spells happen but can't tell me how often, kids notice it. She twitches a lot. No loss of consciousness since the keppra. Her throat is better, she is urinating. The rash resolved.   Interval update: She Is here to  discuss eeg. Reviewed with her. She is on the seizure medication and doing well. She has not been feeling well. Has a new issue, paresthesias in the limbs. She has abdominal pain they cannot figure out. She is very distressed, thinks there is something wrong. She is chronically fatigued, has a tremor, has hold/heat intolerance.   EEG: This is an abnormal EEG recording secondary to dysrhythmic theta activity and sharp transients emanating from the left temporal region. This study suggests a lowered seizure threshold with a left brain focus. No electrographic seizures were seen.  HPI: [name] is a 34 y.o. female here as a referral from Dr. Daiva Eves for seizures. She has a long history of polysubstance abuse and mental Health history of depression, anxiety disorder, PTSD, agoraphobia and Schizoaffective disorder per patient. She is HIV positive. She reports multiple seizure-like events. Her first episode was October of last year. She was driving and then the next thing she knew, she was in the back of an ambulance. She has no idea what happened. Then at Thanksgiving it happened again, she was sitting there watching TV and then she found herself on the floor. She is a very poor historian. She doesn't know what happens, she just loses consciousness. Her husband says she had another episode march 11th of this year, he was in the shower and he heard a noise, when he came out she was shaking and her eyes were closed, lasted at least 10 minutes. Described by husband as shaking, she starts convulsing and smacking  her head on the floor. Her head is going side to side like she is shaking her head "no -no". She is confused afterwards, she doesn't know anything for 20 minutes, doesn't even know the year. She is barely breathing during the events which can last longer than even 15 minutes. She has bitten her tongue twice. No urination. She cannot follow commands during the event. She cannot answer questions during  the event. She denies any current use of any illegal substance other than marijuana and no alcohol use or withdrawal. She reports smacking her head on the floor in a "no-no" pattern. She is under a lot stress, she is having marital problems. No inciting factors, no head trauma. No aura. No headache. She is confused after every episode and is also tired. No FHx of seizures or seizures as a child. No focal neurologic deficits.   Reviewed notes, labs and imaging from outside physicians, which showed:   Recent cbc and cmp unremarkable. Urine drug screen +THC. HIV RNA Viral Load < 40.    CT 11/24/2014: showed No acute intracranial abnormalities including mass lesion or mass effect, hydrocephalus, extra-axial fluid collection, midline shift, hemorrhage, or acute infarction, large ischemic events (personally reviewed images)  6/27: EMS sent to home for seizure. No tongue biting or urination. Per friend, 15 minute LOC with generalized body shaking and patient's head repeatedly hitting a wall. Patient with closed eyes. When she came to she was confused to person and event as well as place. No loss of control of bladder or bowel. No tongue biting. Reviewed notes back to 2013 and do not see any other ED visits for seizure-like activity.   Review of Systems: Patient complains of symptoms per HPI as well as the following symptoms: blurred vision, trouble swallowing, choking, chest tightness, heat intolerance, flushing, abdominal pain, rectal bleeding, nausea, vomiting, insomnia, frequent wakening, back pain, aching muscles, neck pain, dizziness, numbnmess, depression and anxiety. Pertinent negatives per HPI. All others negative.   Social History   Social History  . Marital Status: Married    Spouse Name: Jonny Ruiz  . Number of Children: 2  . Years of Education: 12+   Occupational History  . Unemployed    Social History Main Topics  . Smoking status: Current Every Day Smoker -- 1.00 packs/day for 20 years     Types: Cigarettes  . Smokeless tobacco: Never Used  . Alcohol Use: No  . Drug Use: 7.00 per week    Special: Marijuana, Heroin  . Sexual Activity: Not on file   Other Topics Concern  . Not on file   Social History Narrative   Lives at home with kids   Caffeine use: Drinks coffee (1 cup per day)    Family History  Problem Relation Age of Onset  . Diabetes Maternal Grandfather   . Heart disease Maternal Grandfather   . Seizures Neg Hx   . Breast cancer Mother     Past Medical History  Diagnosis Date  . HIV infection   . Anxiety   . Arthritis   . Chronic headaches   . Depression   . Gallstones   . Pneumonia   . Seizures     Past Surgical History  Procedure Laterality Date  . Cholecystectomy    . Fracture surgery      2-right hand  . Ureter surgery      Placement and removal     Current Outpatient Prescriptions  Medication Sig Dispense Refill  . buprenorphine (SUBUTEX) 8  MG SUBL SL tablet Place 4 mg under the tongue 4 (four) times daily.     . dolutegravir (TIVICAY) 50 MG tablet Take 1 tablet (50 mg total) by mouth daily. 30 tablet 11  . lamivudine (EPIVIR) 300 MG tablet Take 1 tablet (300 mg total) by mouth daily. 30 tablet 11  . levETIRAcetam (KEPPRA) 500 MG tablet Take 1 tablet (500 mg total) by mouth 2 (two) times daily. 60 tablet 6  . pantoprazole (PROTONIX) 40 MG tablet Take 1 tablet (40 mg total) by mouth daily. 90 tablet 3  . tamsulosin (FLOMAX) 0.4 MG CAPS capsule Take 0.4 mg by mouth daily.    . traZODone (DESYREL) 100 MG tablet Take 200 mg by mouth at bedtime as needed for sleep.     No current facility-administered medications for this visit.    Allergies as of 02/09/2015  . (No Known Allergies)    Vitals: BP 110/62 mmHg  Pulse 67  Ht 5' 7.5" (1.715 m)  Wt 112 lb (50.803 kg)  BMI 17.27 kg/m2 Last Weight:  Wt Readings from Last 1 Encounters:  02/09/15 112 lb (50.803 kg)   Last Height:   Ht Readings from Last 1 Encounters:  02/09/15 5'  7.5" (1.715 m)    Physical exam: Exam: Gen: NAD, conversant  Eyes: Conjunctivae clear without exudates or hemorrhage  Neuro: Detailed Neurologic Exam  Speech:  Speech is normal; fluent and spontaneous with normal comprehension.  Cognition:  The patient is oriented to person, place, and time;  Cranial Nerves:  The pupils are equal, round, and reactive to light. Visual fields are full to finger confrontation. Extraocular movements are intact. Trigeminal sensation is intact and the muscles of mastication are normal. The face is symmetric. The palate elevates in the midline. Hearing intact. Voice is normal. Shoulder shrug is normal. The tongue has normal motion without fasciculations.   Motor Observation:  No asymmetry, no atrophy, and no involuntary movements noted. Tone:  Normal muscle tone.    Strength:  Strength is V/V in the upper and lower limbs.     Assessment/Plan: 34 y.o. female here as a referral from Dr. Daiva Eves for seizures. She has a long history of polysubstance abuse and mental Health history of depression, anxiety disorder, PTSD, agoraphobia and Schizoaffective disorder per patient. She is HIV positive. She reports multiple fseizure-like events. Some of the features are not consistent with seizures such as length of 15 minutes or more, shaking her head in a "no-no "pattern, repeatedly banging her head on the wall.but EEG was abnormal so started keppra. Today she has other complaints, chronic fatigue, paresthesias, abdominal pain, staring spells, jerking movements, autonomic phenomena.  EEG - abnormal, pending results for ambulatory EEG.  MRI brain unremarkable 3-day ambulatory EEG monitoring - pending results  continue keppra. Recommend f/u with her hand surgeon for symptoms of carpal Tunnel Recommend eye doctor appointment  No driving until 6 months seizure free, no bathing or swimming alone or anything that could hurt  patient or others should he have a seizure. Patient states that she has been told this before an dunderstands.  Naomie Dean, MD  [facility] [address] [place], [place] [address]  Phone [phone] Fax [phone]  A total of 15 minutes was spent face-to-face with this patient. Over half this time was spent on counseling patient on the seizure diagnosis and different diagnostic and therapeutic options available.

## 2015-02-10 ENCOUNTER — Telehealth: Payer: Self-pay | Admitting: Neurology

## 2015-02-10 NOTE — Telephone Encounter (Signed)
Patient states she "feels like she is losing her mind, she feels like she is not sleeping at all or at least not enough to exist". Patient also is checking status of EEG results.

## 2015-02-10 NOTE — Telephone Encounter (Signed)
Let patient know I don;t have the EEG results yet. It will take some time. Please have her follow up with primary care for insomnia. thanks

## 2015-02-11 ENCOUNTER — Telehealth: Payer: Self-pay

## 2015-02-11 ENCOUNTER — Ambulatory Visit: Payer: Medicare Other | Admitting: Internal Medicine

## 2015-02-11 NOTE — Telephone Encounter (Signed)
Spoke w/ pt. Advised that EEG results were not back yet. It will take time for them to come back. She needs to f/u w/ primary care for sleeping problems. She verbalized understanding. She wanted to know if Dr. Lucia Gaskins can talk with her therapist about what is going on. I advised she would have to add them to her DPR form so that Dr. Lucia Gaskins can disclose/discuss her health information with her. She can come to the office to fill out form. She verbalized understanding.

## 2015-02-11 NOTE — Telephone Encounter (Signed)
Patient calling with complaints of headache, confusion and not able to sleep. She is requesting office visit with Dr Daiva Eves who is out of the office today.  I was going to schedule her with Dr Luciana Axe when I realized she has seizure disorder and was referred to neurology.  When reviewing the chart I noticed she has established care with Dr. Lucia Gaskins.  Since these symptoms are probably not related to her HIV I suggested she call the neurologist for an appointment or to speak with the triage nurse.   If she is cleared through them we will see her for an evaluation.    Laurell Josephs, RN

## 2015-02-12 ENCOUNTER — Ambulatory Visit: Payer: Medicare Other | Admitting: Internal Medicine

## 2015-02-13 ENCOUNTER — Telehealth: Payer: Self-pay | Admitting: Family Medicine

## 2015-02-13 ENCOUNTER — Ambulatory Visit (INDEPENDENT_AMBULATORY_CARE_PROVIDER_SITE_OTHER): Payer: Medicare Other | Admitting: Family Medicine

## 2015-02-13 ENCOUNTER — Encounter: Payer: Self-pay | Admitting: Family Medicine

## 2015-02-13 VITALS — BP 126/72 | HR 55 | Temp 98.7°F | Ht 66.0 in | Wt 112.3 lb

## 2015-02-13 DIAGNOSIS — Z87898 Personal history of other specified conditions: Secondary | ICD-10-CM

## 2015-02-13 DIAGNOSIS — F329 Major depressive disorder, single episode, unspecified: Secondary | ICD-10-CM | POA: Diagnosis not present

## 2015-02-13 DIAGNOSIS — F32A Depression, unspecified: Secondary | ICD-10-CM

## 2015-02-13 DIAGNOSIS — R1012 Left upper quadrant pain: Secondary | ICD-10-CM | POA: Diagnosis not present

## 2015-02-13 DIAGNOSIS — G4700 Insomnia, unspecified: Secondary | ICD-10-CM | POA: Diagnosis not present

## 2015-02-13 DIAGNOSIS — R569 Unspecified convulsions: Secondary | ICD-10-CM

## 2015-02-13 DIAGNOSIS — F259 Schizoaffective disorder, unspecified: Secondary | ICD-10-CM

## 2015-02-13 DIAGNOSIS — F1991 Other psychoactive substance use, unspecified, in remission: Secondary | ICD-10-CM

## 2015-02-13 DIAGNOSIS — G5600 Carpal tunnel syndrome, unspecified upper limb: Secondary | ICD-10-CM | POA: Insufficient documentation

## 2015-02-13 DIAGNOSIS — F1990 Other psychoactive substance use, unspecified, uncomplicated: Secondary | ICD-10-CM

## 2015-02-13 DIAGNOSIS — G5602 Carpal tunnel syndrome, left upper limb: Secondary | ICD-10-CM | POA: Insufficient documentation

## 2015-02-13 DIAGNOSIS — M1990 Unspecified osteoarthritis, unspecified site: Secondary | ICD-10-CM | POA: Insufficient documentation

## 2015-02-13 MED ORDER — HYDROXYZINE HCL 50 MG PO TABS: 50.0000 mg | ORAL_TABLET | Freq: Every evening | ORAL | Status: DC | PRN

## 2015-02-13 MED ORDER — MIRTAZAPINE 15 MG PO TABS: 15.0000 mg | ORAL_TABLET | Freq: Every day | ORAL | Status: DC

## 2015-02-13 NOTE — Telephone Encounter (Signed)
Pt is calling back because the doctor called in a different medication for sleep for her Hydroxyzine, the issue she now has is that her insurance will not cover this. She would like something called in that her insurance will cover. jw

## 2015-02-13 NOTE — Assessment & Plan Note (Signed)
Stable, without worsening - Followed by GI, continue work-up as scheduled. EGD and Esophageal Motility Study without exactly etiology identified

## 2015-02-13 NOTE — Telephone Encounter (Signed)
Called patient back, seen earlier today in OV 02/13/15 by me. Primary issue was insomnia, complex chronic issue that we discussed already extensively. Patient was prescribed Mirtazapine  qhs for sleep, at the time she did not recall if she had taken it before, and agreed to try. Now calling back stating that she cannot tolerate it, previously tried it before (unsure how many months ago) but it did make her manic, kept her awake, and she gained weight due to increased eating, refused to take it again. I advised her that there are very limited options for me at this point, especially since this is my first visit with her and she has an extensive psych history with multiple med failures that I do not have the entire list of, and she actively has a Therapist, sports. I requested that she have all of her Psych records faxed to our [facility] office for review. Additionally, I do not think any other psych med trial and error is appropriate at this time, but I did offer Hydroxyzine (despite her initial statement that it had not worked before, could not recall dose/duration), she agreed to try this over the weekend and contact her Psychiatrist next week.  Discontinued Mirtazapine (Remeron). Sent new rx Hydroxyzine  tabs, take 1-2 (50-100mg ) qhs PRN insomnia, #30, +1 refill.  See OV note for other details.  [name], DO [facility], PGY-3

## 2015-02-13 NOTE — Patient Instructions (Addendum)
Dear [name], Thank you for coming in to clinic today. It was good to meet you!  1. For your Insomnia, I think that there may be several underlying issues, especially medications including the suboxone, your depression / mood disorder is also most definitely contributing - Continue your current plans as far as activity, minimal caffeine, avoid eating late, only change I would recommend try really hard to pick the same time each night to try to physically go to bed, repetition and structure are helpful for sleep - Start Mirtazapine (Remeron)  nightly (1 tablet) for next 1 month. STOP Trazodone  Please request a office note and record from [facility] next time you go  Please schedule a follow-up appointment with Dr. Althea Charon in 1 month for follow-up Insomnia, also we also have you see one of our Psychology / Integrated Care Providers if you schedule on a Tuesday or Friday.  If you have any other questions or concerns, please feel free to call the clinic to contact me. You may also schedule an earlier appointment if necessary.  However, if your symptoms get significantly worse, please go to the Emergency Department to seek immediate medical attention.  Sally Pilar, DO [facility]

## 2015-02-13 NOTE — Telephone Encounter (Signed)
Pt called because she said she can not take Remeron. She would like the doctor to call in something else. Please let patient know when this is done. jw

## 2015-02-13 NOTE — Assessment & Plan Note (Addendum)
Worsening x1 month, chronic problem about 1 year. Complex multi-factorial issues with known multiple uncontrolled psych issues including schizoaffective, bipolar, significant polysubstance abuse history now on suboxone for opiate dependence. No difficulty initiating sleep but problem with sleep latency, wakes up in 2 hours, unable to fall back asleep. No discernable symptoms related to this, but does not history of sleep apnea. Suspect she most definitely has insomnia secondary to poorly controlled mood / bipolar,  - PHQ-9: score 18. - Discussed case with preceptors, decision against starting Ambien given abuse potential in patient on suboxone  Plan: 1. Initially started Mirtazapine  qhs, however patient called and refused to take, previously had problems with worsening insomnia, wt gain. Switched to Hydroxyzine 50-100mg  PRN, unable to pick up at pharmacy due to not covered by insurance. Ultimately, switched to Elavil  qhs PRN #30, 1 refill as trial, phoned into pharmacy. 2. Long discussion on psych / insomnia meds - advised that I need to see her prior records (multiple treatment failure), request records from [facility] 3. May increase Trazodone  up to  qhs PRN 4. Consider MDQ for better evaluation of manic / bipolar symptoms 5. Follow-up 1 month for insomnia, additionally needs to continue to discuss this with Psychiatrist / Counselor. Recommend patient return for Integrated Care Visit as well at next visit

## 2015-02-13 NOTE — Telephone Encounter (Signed)
Called patient's [facility] pharmacy, reviewed with pharmacist that no dose / quantity of Hydroxyzine would be covered by patient's Medicare insurance (also has Medicaid, also would not work). Discontinued Hydroxyzine. Switched to Elavil (Amitriptyline)  qhs PRN insomnia (#30, 0 refills) phoned in to pharmacist, this is covered by her insurance. As stated in prior message, patient has a long list of psych medications that she has already tried for depression / insomnia, and she was unaware if this medication was on the list.  Please call patient back to let her know that Elavil (Amitriptyline)  qhs was phoned in, she may take one before bedtime, this is covered by her insurance. This is a safe dose for her to take, we had talked about variety of complications she has had with other medications. However, if she declines to take this one, then let her know that I have no other options for her at this time. She may double her existing dose of Trazodone, may take 3 of the  capsules for total of , if she chooses instead.  [name], DO [facility], PGY-3

## 2015-02-13 NOTE — Progress Notes (Signed)
Subjective:    Patient ID: [name], female    DOB: Aug 21, 1980, 34 y.o.   MRN: [id number]  [name] is a 34 y.o. female presenting on 02/13/2015 for New Patient (Initial Visit)  Referred by ID to establish as new patient.  Previously followed at [facility] by Dr. Clovis Riley (patient had moved out of state and now returns and this provider is no longer available, as patient attempted to re-establish).  HPI   Insomnia, chronic: - Reports chronic worsening primary concern today. States that when she got off of Heroin in February, she went to a intense detox program, and she had significant insomnia and tried multiple medications such as seroquel. She was started on Trazodone  without relief (has tapered to  qhs PRN) does not take this every night, not seem to help. Previously advised that medications such as Ambien would not be good for her due to her current suboxone treatment. - Reports that she only gets 2 hours of sleep, if goes to bed at 11pm then gets up at 1-2am, states cannot physically sleep more than 2 hours within 24-36 hours. She wakes up and can't go back to sleep. - Overall gradual worsening within past month, but significant worsening and is distressing to her - Tried multiple interventions with sleep hygiene without relief. Active with walking, active around house, minimal caffeine intake, no late night eating. - Admits to depression, prior history on multiple SSRIs multiple failures - No sleep study  CHRONIC SCHIZOAFFECTIVE / DEPRESSION / ANXIETY / PTSD: - Complicated by history of Polysubstance Abuse, specifically with h/o IVDA heroin, since clean 06/2014, has been following closely with [facility] in [place] about 2x weekly continue on Buprenorphine (form of suboxone)  SL tablet 3x daily, long term taper, also has counseling and psychiatry (sees once monthly) - I requested records from her Psychiatrist, as she reports prior  trials on multiple anti-depressant / anxiety / insomnia medications, and states she has failed all of these therapies, long list includes many SSRIs, anti-psychotics  CHRONIC OPIATE USE - Clean off Heroin since Jul 14 2014, [facility] in [place] about 2x weekly continue on Buprenorphine  SL tablet 3x daily, long term taper, also has counseling and psychiatry (sees once monthly)  HIV, controlled on HAART - Followed by Dr. Daiva Eves at [facility], last OV 11/2014. Actively enrolled in HIV medication research study.  CHRONIC EPIGASTRIC / LUQ ABDOMINAL PAIN / GERD / DYSPHAGIA: - Last OV 01/06/15, had recent EGD on 12/29/14 with mild chronic inflammation on distal esophagus (GE junction), not clearly explaining her symptoms. Considering alternative etiology such as neurologic disorder?, proceeding to rule out esophageal motility disorder (. B6 and folate normal. Discontinued Robinul, and continued Protonix  daily, given empiric Mycostatin swish and swallow QID x 10 days - Resutls of Esophageal Motility Study on 01/12/15 (mild fold irregularity in duodenum, ?sprue not ruled out, not esophagram, small type 1 hiatal hernia, normal peristaltic waves, minimal GERD - Continues to endorse chronic mid/epigastric and LUQ pain, constant pain, intermittent worsening, with some variability, not related to eating or other activities. Denies blood in stool, nausea / vomiting.  History of Difficulty Voiding - Reportedly in August had difficulty voiding, required foley catheter for few weeks, then followed up at [facility] (Dr. Julien Girt) and removed foley and started Flomax and has done better. Continues to take this daily  Seizures: - Followed by [facility]-Dr. Lucia Gaskins. Reportedly seizures started approx 02/2014. Currently on  Keppra 500mg  PO BID, recently had last OV 02/09/15 for this complaint, proceeded with EEG continuous x 72 hr, still pending results per patient and chart review. She was advised her  Keppra dose may increase.   Past Medical History  Diagnosis Date  . HIV infection 2016  . Anxiety   . Arthritis 2000    Right wrist/hand, prior fracture  . Chronic headaches 2005    Migraines  . Depression 2001  . Gallstones   . Pneumonia   . Seizures   . 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 Sleep apnea    Social History   Social History  . Marital Status: Married    Spouse Name: Jonny Ruiz  . Number of Children: 2  . Years of Education: 12+   Occupational History  . Unemployed    Social History Main Topics  . Smoking status: Current Every Day Smoker -- 1.00 packs/day for 20 years    Types: Cigarettes  . Smokeless tobacco: Never Used  . Alcohol Use: No  . Drug Use: 7.00 per week    Special: Marijuana, Heroin  . Sexual Activity: Not on file   Other Topics Concern  . Not on file   Social History Narrative   Lives at home with kids   Caffeine use: Drinks coffee (1 cup per day)   Family History  Problem Relation Age of Onset  . Diabetes Maternal Grandfather   . Heart disease Maternal Grandfather   . Seizures Neg Hx   . Breast cancer Mother    Current Outpatient Prescriptions on File Prior to Visit  Medication Sig  . buprenorphine (SUBUTEX) 8 MG SUBL SL tablet Place 8 mg under the tongue 3 (three) times daily.   . dolutegravir (TIVICAY) 50 MG tablet Take 1 tablet (50 mg total) by mouth daily.  Marland Kitchen lamivudine (EPIVIR) 300 MG tablet Take 1 tablet (300 mg total) by mouth daily.  Marland Kitchen levETIRAcetam (KEPPRA) 500 MG tablet Take 1 tablet (500 mg total) by mouth 2 (two) times daily.  . pantoprazole (PROTONIX) 40 MG tablet Take 1 tablet (40 mg total) by mouth daily.  . tamsulosin (FLOMAX) 0.4 MG CAPS capsule Take 0.4 mg by mouth daily.   No current facility-administered medications on file prior to visit.    Review of Systems  Constitutional: Negative for fever, chills, diaphoresis, activity change, appetite change and fatigue.    HENT: Negative for congestion and hearing loss.   Eyes: Negative for visual disturbance.  Respiratory: Negative for cough, chest tightness, shortness of breath and wheezing.   Cardiovascular: Negative for chest pain, palpitations and leg swelling.  Gastrointestinal: Positive for abdominal pain. Negative for nausea, vomiting, diarrhea and constipation.  Genitourinary: Negative for dysuria, frequency and hematuria.  Musculoskeletal: Negative for arthralgias and neck pain.  Skin: Negative for rash.  Neurological: Positive for seizures and numbness. Negative for dizziness, weakness, light-headedness and headaches.  Hematological: Negative for adenopathy.  Psychiatric/Behavioral: Positive for sleep disturbance and dysphoric mood. Negative for suicidal ideas, hallucinations, behavioral problems, confusion and self-injury. The patient is nervous/anxious.    Per HPI unless specifically indicated above     Objective:    BP 126/72 mmHg  Pulse 55  Temp(Src) 98.7 F (37.1 C) (Oral)  Ht 5\' 6"  (1.676 m)  Wt 112 lb 5 oz (50.945 kg)  BMI 18.14 kg/m2  Wt Readings from Last 3 Encounters:  02/13/15 112 lb 5 oz (50.945 kg)  02/09/15 112  lb (50.803 kg)  01/29/15 110 lb 4 oz (50.009 kg)    Physical Exam  Constitutional: She is oriented to person, place, and time. She appears well-developed and well-nourished. No distress.  Thin, chronically ill appearing, older than stated age, comfortable and cooperative  HENT:  Head: Normocephalic and atraumatic.  Mouth/Throat: Oropharynx is clear and moist.  Eyes: Conjunctivae and EOM are normal. Pupils are equal, round, and reactive to light.  Neck: Normal range of motion. Neck supple. No thyromegaly present.  Cardiovascular: Normal rate, regular rhythm, normal heart sounds and intact distal pulses.   No murmur heard. Pulmonary/Chest: Effort normal and breath sounds normal. No respiratory distress. She has no wheezes. She has no rales.  Abdominal: Soft. Bowel  sounds are normal. She exhibits no distension and no mass. There is tenderness (mild +TTP LUQ, minimal epigastric tenderness, otherwise non-tender abdomen. Benign). There is no rebound and no guarding.  Musculoskeletal: Normal range of motion. She exhibits no edema or tenderness.  Lymphadenopathy:    She has no cervical adenopathy.  Neurological: She is alert and oriented to person, place, and time. No cranial nerve deficit. Coordination normal.  Skin: Skin is warm and dry. No rash noted. She is not diaphoretic.  Psychiatric: She has a normal mood and affect. Her behavior is normal. Judgment and thought content normal.  Well groomed. Good eye contact. No extra movements. Normal speech, thoughts, no flight of ideas or tangential or pressured speech  Nursing note and vitals reviewed.  Results for orders placed or performed in visit on 01/28/15  HIV 1 RNA quant-no reflex-bld  Result Value Ref Range   HIV-1 RNA Viral Load <40       Assessment & Plan:   Problem List Items Addressed This Visit      Other   Abdominal pain, left upper quadrant    Stable, without worsening - Followed by GI, continue work-up as scheduled. EGD and Esophageal Motility Study without exactly etiology identified      Depression    Uncontrolled, no active SI / HI. PHQ-9: score 18, somewhat difficult.  See notes on Insomnia      Relevant Medications   traZODone (DESYREL) 100 MG tablet   History of intravenous drug use in remission    Remains in remission. Continue follow-up for chronic suboxone at [facility], weekly apt      Insomnia - Primary    Worsening x1 month, chronic problem about 1 year. Complex multi-factorial issues with known multiple uncontrolled psych issues including schizoaffective, bipolar, significant polysubstance abuse history now on suboxone for opiate dependence. No difficulty initiating sleep but problem with sleep latency, wakes up in 2 hours, unable to fall  back asleep. No discernable symptoms related to this, but does not history of sleep apnea. Suspect she most definitely has insomnia secondary to poorly controlled mood / bipolar,  - PHQ-9: score 18. - Discussed case with preceptors, decision against starting Ambien given abuse potential in patient on suboxone  Plan: 1. Initially started Mirtazapine 15mg  qhs, however patient called and refused to take, previously had problems with worsening insomnia, wt gain. Switched to Hydroxyzine 50-100mg  PRN, unable to pick up at pharmacy due to not covered by insurance. Ultimately, switched to Elavil 50mg  qhs PRN #30, 1 refill as trial, phoned into pharmacy. 2. Long discussion on psych / insomnia meds - advised that I need to see her prior records (multiple treatment failure), request records from [facility] 3. May increase Trazodone 200mg   up to 300mg  qhs PRN 4. Consider MDQ for better evaluation of manic / bipolar symptoms 5. Follow-up 1 month for insomnia, additionally needs to continue to discuss this with Psychiatrist / Counselor. Recommend patient return for Integrated Care Visit as well at next visit      Schizoaffective disorder   Seizures      Meds ordered this encounter  Medications  . DISCONTD: mirtazapine (REMERON) 15 MG tablet    Sig: Take 1 tablet (15 mg total) by mouth at bedtime.    Dispense:  30 tablet    Refill:  2  . traZODone (DESYREL) 100 MG tablet    Sig: Take 200 mg by mouth at bedtime as needed.  Marland Kitchen DISCONTD: hydrOXYzine (ATARAX/VISTARIL) 50 MG tablet    Sig: Take 1-2 tablets (50-100 mg total) by mouth at bedtime as needed (insomnia).    Dispense:  30 tablet    Refill:  1      Follow up plan: Return in about 4 weeks (around 03/13/2015) for insomnia.  [name], DO [facility], PGY-3

## 2015-02-13 NOTE — Assessment & Plan Note (Signed)
Uncontrolled, no active SI / HI. PHQ-9: score 18, somewhat difficult.  See notes on Insomnia

## 2015-02-13 NOTE — Assessment & Plan Note (Signed)
Remains in remission. Continue follow-up for chronic suboxone at [facility], weekly apt

## 2015-02-16 ENCOUNTER — Encounter: Payer: Self-pay | Admitting: *Deleted

## 2015-02-16 NOTE — Progress Notes (Signed)
Received results of ambulatory electroencephalogram with video from [facility]. Results given to Dr. Lucia Gaskins.

## 2015-02-16 NOTE — Progress Notes (Signed)
Received physician referral notification from [facility]:  "The AMB EEG has been scanned and a technical report has been created by our scanning department. Dr. Lura Em please log onto our server to review and complete your interpretation. Please email me at [email or web address] when the report is completed so we can forward it to Dr. Lucia Gaskins. Thank you".

## 2015-02-17 NOTE — Telephone Encounter (Signed)
Left message on voicemail for patient to return call. 

## 2015-02-19 ENCOUNTER — Telehealth: Payer: Self-pay | Admitting: Neurology

## 2015-02-19 ENCOUNTER — Encounter: Payer: Self-pay | Admitting: Infectious Disease

## 2015-02-19 LAB — HIV-1 RNA QUANT-NO REFLEX-BLD

## 2015-02-19 NOTE — Telephone Encounter (Signed)
EEG results showed no electrogrphi seizures but did show sharp transients.

## 2015-02-23 ENCOUNTER — Ambulatory Visit (INDEPENDENT_AMBULATORY_CARE_PROVIDER_SITE_OTHER): Payer: Medicare Other | Admitting: Neurology

## 2015-02-23 ENCOUNTER — Encounter: Payer: Self-pay | Admitting: Neurology

## 2015-02-23 VITALS — BP 110/68 | HR 62 | Ht 66.0 in | Wt 111.6 lb

## 2015-02-23 DIAGNOSIS — R569 Unspecified convulsions: Secondary | ICD-10-CM

## 2015-02-23 MED ORDER — LEVETIRACETAM 750 MG PO TABS: 750.0000 mg | ORAL_TABLET | Freq: Two times a day (BID) | ORAL | Status: DC
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
## 2015-02-23 NOTE — Patient Instructions (Addendum)
Overall you are doing fairly well but I do want to suggest a few things today:   Remember to drink plenty of fluid, eat healthy meals and do not skip any meals. Try to eat protein with a every meal and eat a healthy snack such as fruit or nuts in between meals. Try to keep a regular sleep-wake schedule and try to exercise daily, particularly in the form of walking, 20-30 minutes a day, if you can.   As far as your medications are concerned, I would like to suggest:  Increse Keppra  twice daily  I would like to see you back in 8 - 12 weeks, sooner if we need to. Please call us with any interim questions, concerns, problems, updates or refill requests.   Our phone number is [phone]. We also have an after hours call service for urgent matters and there is a physician on-call for urgent questions. For any emergencies you know to call 911 or go to the nearest emergency room

## 2015-02-24 ENCOUNTER — Other Ambulatory Visit: Payer: Self-pay | Admitting: Neurology

## 2015-02-24 ENCOUNTER — Telehealth: Payer: Self-pay | Admitting: Neurology

## 2015-02-24 MED ORDER — LAMOTRIGINE 25 MG PO TABS: ORAL_TABLET | ORAL | Status: DC

## 2015-02-24 NOTE — Telephone Encounter (Signed)
Patient called back to see if the Lamictal had been filled and was advised to call her pharmacy as your notes read.  She verbalized she still needs titration instructions "if" Rx has been called in.  Please call.

## 2015-02-24 NOTE — Telephone Encounter (Signed)
Called pt back. She stated she discussed switching to Lamictal instead of Keppra with Dr. Lucia Gaskins. She looked up lamictal and would like to try this medication. She had not picked up new Rx Keppra at her pharmacy that Dr. Lucia Gaskins prescribed a couple days ago. She is wondering if there are specific titration instructions she needs to follow. Rx can go to [facility] on [address] if she approves it per pt. Told her I will speak to Dr. Lucia Gaskins and if approved, she will send to her pharmacy. Pt verbalized understanding.

## 2015-02-24 NOTE — Telephone Encounter (Signed)
Patient is calling and states she would like to try the Rx lamictal instead of the Keppra she has been taking.  Please call.

## 2015-02-24 NOTE — Telephone Encounter (Signed)
Called and spoke to pt to let her know Dr. Lucia Gaskins instructions. She will start her on Lamictal. Stop for any rash. Stay on Keppra  twice daily for now. Titration schedule: First 2 weeks:  daily (1 pill)  Weeks 3 and 4:  daily (2 pills)  Weeks 5 and 6:  twice daily (2 pills twice daily)  Weeks 7 and 8:  twice daily (4 pills twice daily)   I faxed rx to pt pharmacy. Received fax confirmation. Pt aware. Told her to call with any questions.

## 2015-02-24 NOTE — Telephone Encounter (Signed)
[name] - I will start patient on Lamictal. Discussed side effects at appointment. Stop for any rash. Stay on the Keppra  twice daily for now. Here is the titration schedule:  First  2 weeks:  daily (1 pill)  Weeks 3 and 4:  daily (2 pills)  Weeks 5 and 6:  twice daily (2 pills twice daily)  Weeks 7 and 8:  twice daily (4 pills twice daily)

## 2015-02-24 NOTE — Telephone Encounter (Signed)
Called pt to let her know that Dr. Lucia Gaskins is with a pt right now and is finishing up for the day. Dr. Lucia Gaskins or I will call her by tomorrow to let her know. She verbalized understanding.

## 2015-02-27 ENCOUNTER — Encounter (INDEPENDENT_AMBULATORY_CARE_PROVIDER_SITE_OTHER): Payer: Self-pay | Admitting: *Deleted

## 2015-02-27 ENCOUNTER — Ambulatory Visit: Payer: Medicare Other | Admitting: Family Medicine

## 2015-02-27 VITALS — BP 104/67 | HR 75 | Temp 98.1°F | Resp 16 | Wt 111.8 lb

## 2015-02-27 DIAGNOSIS — Z006 Encounter for examination for normal comparison and control in clinical research program: Secondary | ICD-10-CM

## 2015-02-27 LAB — COMPREHENSIVE METABOLIC PANEL
ALBUMIN: 4.7 g/dL (ref 3.6–5.1)
ALT: 6 U/L (ref 6–29)
AST: 11 U/L (ref 10–30)
Alkaline Phosphatase: 89 U/L (ref 33–115)
BILIRUBIN TOTAL: 0.5 mg/dL (ref 0.2–1.2)
BUN: 5 mg/dL — ABNORMAL LOW (ref 7–25)
CALCIUM: 9.6 mg/dL (ref 8.6–10.2)
CO2: 27 mmol/L (ref 20–31)
CREATININE: 0.73 mg/dL (ref 0.50–1.10)
Chloride: 102 mmol/L (ref 98–110)
Glucose, Bld: 132 mg/dL — ABNORMAL HIGH (ref 65–99)
Potassium: 4.5 mmol/L (ref 3.5–5.3)
SODIUM: 137 mmol/L (ref 135–146)
TOTAL PROTEIN: 7.1 g/dL (ref 6.1–8.1)

## 2015-02-27 LAB — PHOSPHORUS: PHOSPHORUS: 4.2 mg/dL (ref 2.5–4.5)

## 2015-02-27 LAB — BILIRUBIN, DIRECT: BILIRUBIN DIRECT: 0.1 mg/dL (ref <=0.2)

## 2015-02-27 NOTE — Progress Notes (Signed)
[name] is here for [id number], week 24. Symptoms remain the same. She has started Lamictal to see if this may help. She has completed her 72 hour EEG. Vital signs obtained and blood drawn. She is adherent with her regimen stating she did not miss any doses. Pill count performed and #1 remain for each. 2 months of study medication was dispensed. She received $50 gift card for visit and next appointment scheduled for April 21, 2015 @ 10am. Tacey Heap RN

## 2015-03-02 ENCOUNTER — Ambulatory Visit (INDEPENDENT_AMBULATORY_CARE_PROVIDER_SITE_OTHER): Payer: Medicare Other | Admitting: Family Medicine

## 2015-03-02 ENCOUNTER — Other Ambulatory Visit (HOSPITAL_COMMUNITY)
Admission: RE | Admit: 2015-03-02 | Discharge: 2015-03-02 | Disposition: A | Discharge status: Home / Self Care | Payer: Medicare Other | Source: Ambulatory Visit | Attending: Family Medicine | Admitting: Family Medicine

## 2015-03-02 ENCOUNTER — Encounter: Payer: Self-pay | Admitting: Family Medicine

## 2015-03-02 VITALS — BP 115/68 | HR 62 | Temp 98.3°F | Ht 66.0 in | Wt 114.0 lb

## 2015-03-02 DIAGNOSIS — Z1151 Encounter for screening for human papillomavirus (HPV): Secondary | ICD-10-CM | POA: Insufficient documentation

## 2015-03-02 DIAGNOSIS — G4700 Insomnia, unspecified: Secondary | ICD-10-CM | POA: Diagnosis not present

## 2015-03-02 DIAGNOSIS — R569 Unspecified convulsions: Secondary | ICD-10-CM

## 2015-03-02 DIAGNOSIS — R002 Palpitations: Secondary | ICD-10-CM | POA: Diagnosis not present

## 2015-03-02 DIAGNOSIS — Z01419 Encounter for gynecological examination (general) (routine) without abnormal findings: Secondary | ICD-10-CM | POA: Diagnosis present

## 2015-03-02 DIAGNOSIS — Z124 Encounter for screening for malignant neoplasm of cervix: Secondary | ICD-10-CM | POA: Diagnosis not present

## 2015-03-02 NOTE — Patient Instructions (Addendum)
Dear [name], Thank you for coming in to clinic today. It was good to meet you!  1. Pap Smear completed today - results within 1-2 weeks, I can call with results, otherwise will mail letter 2. Palpitations - Referral placed to Cardiology - discuss Holter Monitor with them 3. Insomnia - can try Trazodone  nightly as needed, otherwise, in future may improve on full dose Lamictal. May consider alternative Nortriptyline (as opposed to the Amitriptyline with Elavil), this med has less side effects and low dose may be tolerable.  Please request a office note and record from [facility] next time you go  Please schedule a follow-up appointment with Dr. Althea Charon in 1-3 months for follow-up Insomnia (on TUES / FRI for [facility])  If you have any other questions or concerns, please feel free to call the clinic to contact me. You may also schedule an earlier appointment if necessary.  However, if your symptoms get significantly worse, please go to the Emergency Department to seek immediate medical attention.  [name], DO [facility]

## 2015-03-02 NOTE — Progress Notes (Signed)
Subjective:    Patient ID: [name], female    DOB: March 08, 1981, 34 y.o.   MRN: [id number]  [name] is a 34 y.o. female presenting on 03/02/2015 for Gynecologic Exam  HPI  PAP SMEAR - Due for pap smear today. No record of last pap smear. Discussed at last initial visit to establish care in 01/2015. - No known prior abnormal pap smear, no known history of or family history of cervical cancer.  PALPITATIONS: - Reports chronic history of intermittent palpitations with "fluttering heart", occasional chest pains, flushing feeling, does not seem to be related to physical activity or other activities. Patient states she was previously evaluated and recommended to have a "holter monitor" but states this was never ordered. Requesting Cardiology referral today to consider holter monitor  Insomnia, chronic: - Known chronic history of insomnia complicated by chronic substance abuse with opiates now on suboxone therapy, complicated history of psych dx with mood disorder, has been tried on extensive number of mood modulating meds multiple failures. Actively followed by Psychiatry and Neurology. - Last visit 02/13/15, discussed several options including remeron (initial rx but then refused due to prior trial and kept her awake and did not help insomnia), switched to amitriptyline however pt declined to take this due to side effects did not try it (concerned about urinary retention and prolonged QT). 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 [place] infection ([place]  . Anxiety   . Arthritis 2000    Right wrist/hand, prior fracture  . Chronic headaches 2005    Migraines  . Depression 2001  . Gallstones   . Pneumonia   . [place]izures [place]   . 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 Sleep apnea     Social History   Social History  . Marital Status: Married    Spouse Name: JJenny Reichmann . Number of Children: 2  . Years of Education: 12+   Occupational History  . Unemployed    Social History Main Topics  . Smoking status: Current Every Day Smoker -- 1.00 packs/day for 20 years    Types: Cigarettes  . Smokeless tobacco: Never Used  . Alcohol Use: No  . Drug Use: 7.00 per week    Special: Marijuana, Heroin  . Sexual Activity: Not on file   Other Topics Concern  . Not on file   Social History Narrative   Lives at home with kids   Caffeine use: Drinks coffee (1 cup per day)    Current Outpatient Prescriptions on File Prior to Visit  Medication Sig  . buprenorphine (SUBUTEX) 8 MG SUBL SL tablet Place 8 mg under the tongue 3 (three) times daily.   . dolutegravir (TIVICAY) 50 MG tablet Take 1 tablet (50 mg total) by mouth daily.  .Marland Kitchenlamivudine (EPIVIR) 300 MG tablet Take 1 tablet (300 mg total) by mouth daily.  .Marland KitchenlamoTRIgine (LAMICTAL) 25 MG tablet First  2 weeks: 228mdaily (1 pill), Weeks 3 and 4: 5067maily (2 pills) , Weeks 5 and 6: 50m59mice daily (2 pills twice daily) , Weeks 7 and 8: 100mg38mce daily (4 pills twice daily)  . levETIRAcetam (KEPPRA) 750 MG tablet Take 1 tablet (750 mg total) by mouth 2 (two) times  daily.  . pantoprazole (PROTONIX) 40 MG tablet Take 1 tablet (40 mg total) by mouth daily.  . tamsulosin (FLOMAX) 0.4 MG CAPS capsule Take 0.4 mg by mouth daily.  . traZODone (DESYREL) 100 MG tablet Take 200 mg by mouth at bedtime as needed.   No current facility-administered medications on file prior to visit.    Review of Systems  Constitutional: Negative for fever, chills, diaphoresis, activity change, appetite change and fatigue.  HENT: Negative for congestion and hearing loss.   Eyes: Negative for visual disturbance.  Respiratory: Negative for cough, chest tightness, shortness of breath and wheezing.   Cardiovascular: Positive for  palpitations. Negative for chest pain and leg swelling.  Gastrointestinal: Negative for nausea, vomiting, abdominal pain, diarrhea and constipation.  Genitourinary: Negative for dysuria, frequency, hematuria, vaginal bleeding, vaginal discharge, vaginal pain and pelvic pain.  Musculoskeletal: Negative for arthralgias and neck pain.  Skin: Negative for rash.  Neurological: Negative for dizziness, weakness, light-headedness, numbness and headaches.  Hematological: Negative for adenopathy.  Psychiatric/Behavioral: Positive for sleep disturbance. Negative for suicidal ideas, behavioral problems, confusion, self-injury and dysphoric mood. The patient is nervous/anxious. The patient is not hyperactive.    Per HPI unless specifically indicated above     Objective:    BP 115/68 mmHg  Pulse 62  Temp(Src) 98.3 F (36.8 C) (Oral)  Ht '5\' 6"'  (1.676 m)  Wt 114 lb (51.71 kg)  BMI 18.41 kg/m2  Wt Readings from Last 3 Encounters:  03/02/15 114 lb (51.71 kg)  02/27/15 111 lb 12 oz (50.689 kg)  02/23/15 111 lb 9.6 oz (50.621 kg)    Physical Exam  Constitutional: She is oriented to person, place, and time. She appears well-developed and well-nourished. No distress.  HENT:  Head: Normocephalic and atraumatic.  Mouth/Throat: Oropharynx is clear and moist.  Eyes: Conjunctivae and EOM are normal. Pupils are equal, round, and reactive to light.  Neck: Normal range of motion. Neck supple. No thyromegaly present.  Cardiovascular: Normal rate, regular rhythm, normal heart sounds and intact distal pulses.   No murmur heard. Genitourinary: Vagina normal and uterus normal. No vaginal discharge found.  Normal external female genitalia. Vaginal canal without lesions. Normal appearing cervix, without lesions or bleeding. Physiologic discharge on exam. Bimanual exam without masses or cervical motion tenderness.  Musculoskeletal: Normal range of motion. She exhibits no edema or tenderness.  Lymphadenopathy:     She has no cervical adenopathy.  Neurological: She is alert and oriented to person, place, and time.  Skin: Skin is warm and dry. No rash noted. She is not diaphoretic.  Psychiatric: She has a normal mood and affect. Her behavior is normal. Judgment and thought content normal.  Nursing note and vitals reviewed.  Pelvic exam chaperoned by Delray Alt, CMA  Results for orders placed or performed in visit on 02/27/15  Comp Met (CMET)  Result Value Ref Range   Sodium 137 135 - 146 mmol/L   Potassium 4.5 3.5 - 5.3 mmol/L   Chloride 102 98 - 110 mmol/L   CO2 27 20 - 31 mmol/L   Glucose, Bld 132 (H) 65 - 99 mg/dL   BUN 5 (L) 7 - 25 mg/dL   Creat 0.73 0.50 - 1.10 mg/dL   Total Bilirubin 0.5 0.2 - 1.2 mg/dL   Alkaline Phosphatase 89 33 - 115 U/L   AST 11 10 - 30 U/L   ALT 6 6 - 29 U/L   Total Protein 7.1 6.1 - 8.1 g/dL   Albumin 4.7 3.6 -  5.1 g/dL   Calcium 9.6 8.6 - 10.2 mg/dL  Bilirubin, Direct  Result Value Ref Range   Bilirubin, Direct 0.1 <=0.2 mg/dL  Phosphorus  Result Value Ref Range   Phosphorus 4.2 2.5 - 4.5 mg/dL      Assessment & Plan:   Problem List Items Addressed This Visit      Other   Insomnia    Chronic problem, without worsening today. Still no improvement. Complex multi-factorial issue with known uncontrolled psych (schizoaffective, bipolar, significant polysubstance abuse h/o now on suboxone for opiate dependence) - Recently refused Remeron, Amitriptyline  Plan: 1. Anticipate some possible improvement now on Lamictal per Neurology for seizures, may have mood stabilizing effect, wait 8 weeks to titrate to full before trial of other meds 2. Advised continue improving sleep hygiene improvements 3. Continue Trazodone 260m qhs, may try increase max dose 3048mqhs PRN 4. [facility] Continue follow-up Psychiatry and Neurology 5. RTC 2-3 mo       Palpitations    Intermittent palpitations with  multiple constellation of symptoms, seems to be chronic problem. Unlikely significant cardiac etiology, no exertional symptoms or CP, highly suspect stress or anxiety related.  Plan: 1. Referral patient to Cardiologist for second opinion, consider proceeding with Holter Monitor as previously recommended to patient 2. Advised reduce caffeine, continue to work with Psychiatry to treat likely underlying problem      Relevant Orders   Ambulatory referral to Cardiology   Seizures ([facility]   Continue to follow-up with Neurology, she was started on Lamictal recently titrate up over 8 weeks, on Keppra, follow-up to see if continue keppra after lamictal titration       Other Visit Diagnoses    Encounter for screening for cervical cancer     -  Primary    Routine pap smear with HPV co-testing today    Relevant Orders    Cytology - PAP       No orders of the defined types were placed in this encounter.      Follow up plan: Return in about 4 weeks (around 03/30/2015) for insomnia.  A total of 25 minutes was spent face-to-face with this patient. Over half this time was spent on counseling patient on the diagnosis and therapeutic options available.  AlNobie PutnamDOLeisure Village WestPGY-3

## 2015-03-03 ENCOUNTER — Telehealth: Payer: Self-pay | Admitting: Neurology

## 2015-03-03 NOTE — Assessment & Plan Note (Signed)
Intermittent palpitations with multiple constellation of symptoms, seems to be chronic problem. Unlikely significant cardiac etiology, no exertional symptoms or CP, highly suspect stress or anxiety related.  Plan: 1. Referral patient to Cardiologist for second opinion, consider proceeding with Holter Monitor as previously recommended to patient 2. Advised reduce caffeine, continue to work with Psychiatry to treat likely underlying problem

## 2015-03-03 NOTE — Assessment & Plan Note (Signed)
Continue to follow-up with Neurology, she was started on Lamictal recently titrate up over 8 weeks, on Keppra, follow-up to see if continue keppra after lamictal titration

## 2015-03-03 NOTE — Telephone Encounter (Signed)
Called patient and left a voice message that she needs to r/s as Dr. Lucia Gaskins is out of the office on a family emergency.

## 2015-03-03 NOTE — Assessment & Plan Note (Signed)
Chronic problem, without worsening today. Still no improvement. Complex multi-factorial issue with known uncontrolled psych (schizoaffective, bipolar, significant polysubstance abuse h/o now on suboxone for opiate dependence) - Recently refused Remeron, Amitriptyline  Plan: 1. Anticipate some possible improvement now on Lamictal per Neurology for seizures, may have mood stabilizing effect, wait 8 weeks to titrate to full before trial of other meds 2. Advised continue improving sleep hygiene improvements 3. Continue Trazodone  qhs, may try increase max dose  qhs PRN 4. Strongly recommend [facility] at [facility] visit. Continue follow-up Psychiatry and Neurology 5. RTC 2-3 mo

## 2015-03-04 ENCOUNTER — Ambulatory Visit (INDEPENDENT_AMBULATORY_CARE_PROVIDER_SITE_OTHER): Payer: Medicare Other | Admitting: Gastroenterology

## 2015-03-04 ENCOUNTER — Encounter: Payer: Self-pay | Admitting: Gastroenterology

## 2015-03-04 VITALS — BP 110/70 | HR 80 | Ht 66.0 in | Wt 113.6 lb

## 2015-03-04 DIAGNOSIS — R1310 Dysphagia, unspecified: Secondary | ICD-10-CM

## 2015-03-04 DIAGNOSIS — R1012 Left upper quadrant pain: Secondary | ICD-10-CM

## 2015-03-04 LAB — CYTOLOGY - PAP

## 2015-03-04 MED ORDER — GLYCOPYRROLATE 2 MG PO TABS: ORAL_TABLET | ORAL | Status: DC

## 2015-03-04 NOTE — Assessment & Plan Note (Signed)
Symptoms are nonspecific.  Doubt underlying GI disorder.  Plan trial of hyomax

## 2015-03-04 NOTE — Assessment & Plan Note (Signed)
Symptoms are minimal.  No specific abnormality has been identified.  Plan to continue current medications

## 2015-03-04 NOTE — Progress Notes (Signed)
.       History of Present Illness:  [name] continues to complain of vague upper abdominal discomfort. She's not currently complaining of dysphagia.    A celiac panel was negative.  Esophagram  demonstrated a small hiatal hernia.  There is a question of mild fold irregularity in the duodenum.    Review of Systems: Pertinent positive and negative review of systems were noted in the above HPI section. All other review of systems were otherwise negative.    Current Medications, Allergies, Past Medical History, Past Surgical History, Family History and Social History were reviewed in [employer] electronic medical record  Vital signs were reviewed in today's medical record. Physical Exam: General: thin female in no acute distress   See Assessment and Plan under Problem List

## 2015-03-04 NOTE — Patient Instructions (Signed)
Follow up as needed

## 2015-03-05 ENCOUNTER — Ambulatory Visit: Payer: Medicare Other | Admitting: Neurology

## 2015-03-05 ENCOUNTER — Telehealth: Payer: Self-pay | Admitting: Gastroenterology

## 2015-03-05 ENCOUNTER — Telehealth: Payer: Self-pay | Admitting: Family Medicine

## 2015-03-05 DIAGNOSIS — B373 Candidiasis of vulva and vagina: Secondary | ICD-10-CM

## 2015-03-05 DIAGNOSIS — B3731 Acute candidiasis of vulva and vagina: Secondary | ICD-10-CM

## 2015-03-05 MED ORDER — FLUCONAZOLE 150 MG PO TABS: 150.0000 mg | ORAL_TABLET | Freq: Once | ORAL | Status: DC

## 2015-03-05 NOTE — Telephone Encounter (Signed)
I just sent in robinul

## 2015-03-05 NOTE — Telephone Encounter (Signed)
Dr Arlyce Dice, What else do you want to send for her

## 2015-03-05 NOTE — Telephone Encounter (Signed)
Last OV 03/04/15 for routine pap smear cervical cancer screening. Reviewed results with Pap Smear (sample adequate with transition zone present): Negative malignancy. No high risk HPV detected.  Called patient, reviewed the above pap smear results with her. Advised that negative pap with HPV co testing can be good under some guidelines for 3 to 5 years, recommended re-test in 3 years or sooner if problem. Also on pap smear identified Fungal vaginitis with candida, sent in Diflucan  x 1 dose. Will also mail copy of pap results to patient.  [name], DO [facility], PGY-3

## 2015-03-05 NOTE — Telephone Encounter (Signed)
See ov with Amy Esterwood on 01-06-15.

## 2015-03-06 NOTE — Telephone Encounter (Signed)
I spoke with the patient. Advised her this class of medications all have the potential to cause urinary retention. She opts to not take the medication.

## 2015-03-11 ENCOUNTER — Encounter: Payer: Self-pay | Admitting: Neurology

## 2015-03-11 ENCOUNTER — Ambulatory Visit (INDEPENDENT_AMBULATORY_CARE_PROVIDER_SITE_OTHER): Payer: Medicare Other | Admitting: Neurology

## 2015-03-11 VITALS — BP 107/65 | HR 74 | Ht 66.0 in | Wt 113.4 lb

## 2015-03-11 DIAGNOSIS — R569 Unspecified convulsions: Secondary | ICD-10-CM | POA: Diagnosis not present

## 2015-03-11 NOTE — Patient Instructions (Signed)
Continue Lamictal Titration:  First 2 weeks: 25mg  daily (1 pill)  Weeks 3 and 4: 50mg  daily (2 pills)  Weeks 5 and 6: 50mg  twice daily (2 pills twice daily)  Weeks 7 and 8: 100mg  twice daily (4 pills twice daily)
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
## 2015-03-16 ENCOUNTER — Encounter: Payer: Self-pay | Admitting: Family Medicine

## 2015-03-16 ENCOUNTER — Ambulatory Visit (INDEPENDENT_AMBULATORY_CARE_PROVIDER_SITE_OTHER): Payer: Medicare Other | Admitting: Family Medicine

## 2015-03-16 VITALS — BP 115/61 | HR 78 | Temp 98.6°F | Ht 66.0 in | Wt 109.7 lb

## 2015-03-16 DIAGNOSIS — G4700 Insomnia, unspecified: Secondary | ICD-10-CM | POA: Diagnosis present

## 2015-03-16 NOTE — Assessment & Plan Note (Addendum)
1. Insomnia, chronic Persistent chronic insomnia overall for at least 8 months with worsening now (with prior history as well). As previously noted, this is a complex multi-factorial problem with some known uncontrolled psych problems (schizoaffective vs bipolar disorder, h/o polysubstance abuse now on suboxone for opiate dependence), both mood and sleep refractory to multiple failed therapies in past from current Psychiatry and Neurology (including many SSRIs, including Remeron) and declined to take Amitriptyline due to potential side-effects (Prolonged QTc) - MDQ today score 2, moderate problem (score with irritability and difficulty sleeping) otherwise not diagnostic of uncontrolled manic symptoms with bipolar, also with PHQ-9: score 1 with reportedly controlled mood symptoms currently - Denies SI/HI  Plan: 1. Continue current titration of Lamictal (full dose by 04/07/15) by Neurology (for seizures) and de-escalation of Keppra, do not want to start any new medications at this time. Hoping that Lamictal therapeutic dose will act as mood stabilizer and may help some component of insomnia 2. Referral today to Neurology (Sleep Medicine specialist - Dr. Porfirio Mylararmen Dohmeier [facility]. Did not order sleep study, as patient has had preivously 3. At this time would avoid starting ambien or other sleep aid in this complex patient 4. Request records from Psychiatry Dr Margarita RanaSurya Challa [facility]([place]) 5. RTC 1-3 months follow-up insomnia, further discuss sleep hygiene, see if improved on max lamictal dose, consider CBTI (CBT insomnia therapy), integrative care

## 2015-03-16 NOTE — Progress Notes (Signed)
Patient ID: [name], female   DOB: 08-15-1980, 34 y.o.   MRN: [id number]    Subjective: CC: [place]ia HPI: This history was provided by the patient.  Patient is a 34 y.o. female presenting to clinic today for follow-up on insomnia.  Patient was started on Elavil during her last visit, but decided not attempt to take it because of potential side effects (including Prolonged QT, Prolactinemia) Tried increased dose Trazodone to 300mg  qhs but no relief and resumed prior 200mg  nightly. Patient states she is sleeping 2-3 hours a night.  She states she can fall asleep without significant difficulty, but will wake up after several hours and is unable to return to sleeping.  Patient states she does not nap during the day.  Patient states she has established a consistent bedtime routine, tries to avoid stimulants prior to bedtime and has removed electronic devices from her bedroom.  Patient states she has used lavender as aromatherapy with no relief.  Patient is otherwise at baseline.  Her epigastric pain continues, but she is being followed by GI and has an appointment with a cardiologist this week (10/20) after recent referral for palpitations.  Social History Reviewed: current smoker. FamHx and MedHx updated.  Please see EMR.  ROS: Per HPI  Objective: Office vital signs reviewed. BP 115/61 mmHg  Pulse 78  Temp(Src) 98.6 F (37 C) (Oral)  Ht 5\' 6"  (1.676 m)  Wt 109 lb 11.2 oz (49.76 kg)  BMI 17.71 kg/m2  Physical Examination:  General: Awake, alert, thin, NAD Cardio: RRR, S1S2 heard, no murmurs appreciated, +2 pulses bilaterally Pulm: CTAB, no wheezes, rhonchi or rales GI: soft, NT/ND,+BS x4, no hepatomegaly, no splenomegaly GU: deffered Extremities: No edema, cyanosis or clubbing MSK: Normal gait and station Skin: dry, intact, no rashes or lesions Neuro: Strength and sensation grossly intact  Assessment/ Plan: 34 y.o. female  1. Insomnia, chronic Persistent chronic insomnia  overall for at least 8 months with worsening now (with prior history as well). As previously noted, this is a complex multi-factorial problem with some known uncontrolled psych problems (schizoaffective vs bipolar disorder, h/o polysubstance abuse now on suboxone for opiate dependence), both mood and sleep refractory to multiple failed therapies in past from current Psychiatry and Neurology (including many SSRIs, including Remeron) and declined to take Amitriptyline due to potential side-effects (Prolonged QTc) - MDQ today score 2, moderate problem (score with irritability and difficulty sleeping) otherwise not diagnostic of uncontrolled manic symptoms with bipolar, also with PHQ-9: score 1 with reportedly controlled mood symptoms currently - Denies SI/HI  Plan: 1. Continue current titration of Lamictal (full dose by 04/07/15) by Neurology (for seizures) and de-escalation of Keppra, do not want to start any new medications at this time. Hoping that Lamictal therapeutic dose will act as mood stabilizer and may help some component of insomnia 2. Referral today to Neurology (Sleep Medicine specialist - Dr. Porfirio Mylararmen Dohmeier [facility]. Did not order sleep study, as patient has had preivously 3. At this time would avoid starting ambien or other sleep aid in this complex patient 4. Request records from Psychiatry Dr Margarita RanaSurya Challa [facility]([place])  Sally RoutLaura Annisa Mazzarella, NP [facility]

## 2015-03-16 NOTE — Patient Instructions (Signed)
Dear [name], Thank you for coming in to clinic today. It was good to see you!  1. Today we referred you to a Sleep Medicine Specialist, you should hear back within few days to weeks about this appointment, they will determine if you need a sleep study or not 2. Keep taking lamictal as prescribed  Please schedule a follow-up appointment with Dr. Althea CharonKaramalegos in 1-3 months after you see the sleep doctor.  If you have any other questions or concerns, please feel free to call the clinic to contact me. You may also schedule an earlier appointment if necessary.  However, if your symptoms get significantly worse, please go to the Emergency Department to seek immediate medical attention.  Sally PilarAlexander Karamalegos, DO [facility]

## 2015-03-18 ENCOUNTER — Institutional Professional Consult (permissible substitution): Payer: Medicare Other | Admitting: Neurology

## 2015-03-18 ENCOUNTER — Encounter: Payer: Self-pay | Admitting: Infectious Disease

## 2015-03-18 ENCOUNTER — Telehealth: Payer: Self-pay | Admitting: *Deleted

## 2015-03-18 ENCOUNTER — Telehealth: Payer: Self-pay | Admitting: Family Medicine

## 2015-03-18 LAB — CD4/CD8 (T-HELPER/T-SUPPRESSOR CELL)
CD4%: 45.2
CD4: 452
CD8 % Suppressor T Cell: 32.6
CD8: 326

## 2015-03-18 LAB — HIV-1 RNA QUANT-NO REFLEX-BLD

## 2015-03-18 NOTE — Telephone Encounter (Signed)
I can cc Dr. Maple HudsonYoung. I do not know why she was refused. I will also cc Theodoro Gristave

## 2015-03-18 NOTE — Telephone Encounter (Signed)
Thanks Clint I apologize for bothering you about this. I cc you because I was under the impression you were supervising the sleep center

## 2015-03-18 NOTE — Telephone Encounter (Signed)
Ginger- I know nothing aboput this and this is not my patient. Are you able to call her and tell her more diplomatically whay her study was cancelled? It might be because insurance denied it, but I just had this message in my basket on return from vacation.

## 2015-03-18 NOTE — Telephone Encounter (Signed)
Referral coordinator Purvis Sheffield([place]) sent epic message to Josue HectorLinda McIntosh, [facility], regarding patient report of being dismissed. Will wait to hear back.

## 2015-03-18 NOTE — Telephone Encounter (Signed)
Patient called very upset because she was scheduled to have sleep study and 30 min prior to visit was advised appt was canceled and she is dismissed from their service. They would not tell her why and told her to call our office as Dr Daiva EvesVan Dam could answer Why. Advised her this office had nothing to do with the referral and I am not sure why they canceled and will not see her. She is very upset and would like for the doctor to call her and tell her why she can not be seen at the Sleep center. Asked her to call back and ask for the practice manager there and if she can not get an answer she can call back and I will give her our Engineer, manufacturingpractice manager and maybe he can help. I reminded her every office is different and they have the right to refuse service but usually will tell you why. She was hysterical by this time and I advised her will send Dr Daiva EvesVan Dam a message and either he will call her back or I will.

## 2015-03-18 NOTE — Telephone Encounter (Signed)
Thank you, she is very angry right now so that may be the best thing.

## 2015-03-18 NOTE — Telephone Encounter (Signed)
Pt calls, crying & very upset, stated she received a call to set up her appt with Dr. Porfirio [facility]yl[facility]armen [facility] at [facility]. The appt was originally scheduled for 04/07/15 but then the office had a cancellation and patient was scheduled for today 03/18/15. Pt states about 30 mins after she was scheduled for today, she got another call from Sleep Center stating that she has been dismissed from the office and appt had been cancelled and that she would need to call her PCP to find out why. Patient has never been seen at the sleep center but is a patient [facility], Dr. Lucia GaskinsAhern. Please advise patient.

## 2015-03-19 ENCOUNTER — Encounter: Payer: Self-pay | Admitting: Cardiovascular Disease

## 2015-03-19 ENCOUNTER — Ambulatory Visit (INDEPENDENT_AMBULATORY_CARE_PROVIDER_SITE_OTHER): Payer: Medicare Other | Admitting: Cardiovascular Disease

## 2015-03-19 VITALS — BP 130/70 | HR 97 | Ht 66.0 in | Wt 111.5 lb

## 2015-03-19 DIAGNOSIS — Z79899 Other long term (current) drug therapy: Secondary | ICD-10-CM

## 2015-03-19 DIAGNOSIS — R002 Palpitations: Secondary | ICD-10-CM

## 2015-03-19 DIAGNOSIS — R072 Precordial pain: Secondary | ICD-10-CM | POA: Diagnosis not present

## 2015-03-19 DIAGNOSIS — R0602 Shortness of breath: Secondary | ICD-10-CM | POA: Diagnosis not present

## 2015-03-19 DIAGNOSIS — R1900 Intra-abdominal and pelvic swelling, mass and lump, unspecified site: Secondary | ICD-10-CM | POA: Diagnosis not present

## 2015-03-19 MED ORDER — FLUDROCORTISONE ACETATE 0.1 MG PO TABS: 0.1000 mg | ORAL_TABLET | Freq: Every day | ORAL | Status: DC

## 2015-03-19 NOTE — Patient Instructions (Addendum)
Your physician has requested that you have an exercise tolerance test. For further information please visit [email or web address]. Please also follow instruction sheet, as given.   Your physician has requested that you have an abdominal aorta duplex. During this test, an ultrasound is used to evaluate the aorta. Allow 30 minutes for this exam. Do not eat after midnight the day before and avoid carbonated beverages   Your physician has recommended that you wear a holter monitor FOR 48 HOURS - YOU WILL GOT TO [address]. Holter monitors are medical devices that record the heart's electrical activity. Doctors most often use these monitors to diagnose arrhythmias. Arrhythmias are problems with the speed or rhythm of the heartbeat. The monitor is a small, portable device. You can wear one while you do your normal daily activities. This is usually used to diagnose what is causing palpitations/syncope (passing out).  WILL CONTACT WITH RESULTS  Start florinef  0.1 mg one tablet daily.  LABS- 1  WEEK  BMP - [address] -- [facility].  Your physician recommends that you schedule a follow-up appointment in 3 MONTHS WITH DR [facility]OL[facility].  If you need a refill on your cardiac medications before your next appointment, please call your pharmacy.

## 2015-03-19 NOTE — Progress Notes (Signed)
Cardiology Office Note   Date:  03/19/2015   ID:  [name], DOB [birth year], MRN [id number]  PCP:  [name], DO  Cardiologist:   Madilyn Hook, MD   Chief Complaint  Patient presents with  . New Evaluation    palpitations/chest pain/abdominal pain/light headed going from lying to sitting/clammy when sitting up      History of Present Illness: [name] is a 34 y.o. female with HIV, schizoaffective disorder, seizures and ongoing tobacco abuse who presents for an evaluation of palpitations.  [name] reports multiple episodes of feeling like her heart swells in her chest. After that she gets hot and sweaty and feels horrible. These episodes occur almost daily and associated with shortness of breath. She feels as though she can't catch her breath. It's associated with nausea, vomiting and diaphoresis that feels like cold sweats. Each episode lasts for approximately 10 minutes. She has to sit down in order to avoid passing out. She has not had any syncope. Sometimes this is also associated with sharp left-sided chest pain that feels like it's around her diaphragm. It occurs both at with exertion and at rest.  She states that she can feel her heartbeat in her eyeballs and can see her shirt moving with every heartbeat.  She reports that her blood pressure is always slow and has been as low as 60/30 when she's been in the emergency department for monitoring. She gets lightheaded with positional changes but denies syncope because she only sits down before this happens.  [name] was also diagnosed with seizures one year ago. She has no prodromal warning but says that people around her noticed that she has a weird look on her face before she goes into a tonic-clonic seizure. She has no remembrance of these events and has a prolonged postictal period. She's had an EEG that showed epileptiform activity but no seizures.  [name] has been followed by  gastroenterologist because she feels like food gets stuck when she tries to swallow and ends up regurgitating it. She's had a negative celiac panel and esophagram showed a small hiatal hernia. There was a question of a mild fold irregularity in the duodenum. She drinks a lot of water up to 4, 32 ounce bottle water bottles daily. However she is replacing many meals with either Jell-O were Carnation.  Yesterday she was able to use 2 pieces of chicken and a small amount of rice.  Her gastroenterologist felt as though her symptoms were nonspecific and unlikely to be due to an underlying GI disorder. He did prescribe a trial of hyomax.   Past Medical History  Diagnosis Date  . HIV infection (HCC) 2016  . Anxiety   . Arthritis 2000    Right wrist/hand, prior fracture  . Chronic headaches 2005    Migraines  . Depression 2001  . Gallstones   . Pneumonia   . Seizures (HCC)   . 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 Sleep apnea     Past Surgical History  Procedure Laterality Date  . Cholecystectomy    . Fracture surgery      2-right hand  . Ureter surgery      Placement and removal   . Rotator cuff repair Right 2000    repair frozen shoulder 2001     Current Outpatient Prescriptions  Medication Sig Dispense Refill  . buprenorphine (SUBUTEX) 8 MG SUBL  SL tablet Place 8 mg under the tongue 3 (three) times daily.     . dolutegravir (TIVICAY) 50 MG tablet Take 1 tablet (50 mg total) by mouth daily. 30 tablet 11  . lamivudine (EPIVIR) 300 MG tablet Take 1 tablet (300 mg total) by mouth daily. 30 tablet 11  . lamoTRIgine (LAMICTAL) 25 MG tablet First  2 weeks: 25mg  daily (1 pill), Weeks 3 and 4: 50mg  daily (2 pills) , Weeks 5 and 6: 50mg  twice daily (2 pills twice daily) , Weeks 7 and 8: 100mg  twice daily (4 pills twice daily) 240 tablet 6  . levETIRAcetam (KEPPRA) 500 MG tablet Take 500 mg by mouth 2 (two) times daily.     . pantoprazole (PROTONIX)  40 MG tablet Take 1 tablet (40 mg total) by mouth daily. 90 tablet 3  . tamsulosin (FLOMAX) 0.4 MG CAPS capsule Take 0.4 mg by mouth daily.    . traZODone (DESYREL) 100 MG tablet Take 200 mg by mouth at bedtime as needed.    . fludrocortisone (FLORINEF) 0.1 MG tablet Take 1 tablet (0.1 mg total) by mouth daily. 30 tablet 6   No current facility-administered medications for this visit.    Allergies:   Robinul    Social History:  The patient  reports that she has been smoking Cigarettes.  She has a 20 pack-year smoking history. She has never used smokeless tobacco. She reports that she uses illicit drugs (Marijuana and Heroin) about 7 times per week. She reports that she does not drink alcohol.   Family History:  The patient's family history includes Breast cancer in her mother; Diabetes in her maternal grandfather; Heart disease in her maternal grandfather. There is no history of Seizures.    ROS:  Please see the history of present illness.  Otherwise, review of systems are positive for insomnia.   All other systems are reviewed and negative.    PHYSICAL EXAM: VS:  BP 130/70 mmHg  Pulse 97  Ht 5\' 6"  (1.676 m)  Wt 50.576 kg (111 lb 8 oz)  BMI 18.01 kg/m2 , BMI Body mass index is 18.01 kg/(m^2). GENERAL:  Well appearing HEENT:  Pupils equal round and reactive, fundi not visualized, oral mucosa unremarkable NECK:  No jugular venous distention, waveform within normal limits, carotid upstroke brisk and symmetric, no bruits, no thyromegaly LYMPHATICS:  No cervical adenopathy LUNGS:  Clear to auscultation bilaterally HEART:  RRR.  PMI not displaced or sustained,S1 and S2 within normal limits, no S3, no S4, no clicks, no rubs, no murmurs ABD:  Flat, positive bowel sounds normal in frequency in pitch, no bruits, no rebound, no guarding, + midline pulsatility, no hepatomegaly, no splenomegaly EXT:  2 plus pulses throughout, no edema, no cyanosis no clubbing SKIN:  No rashes no nodules NEURO:   Cranial nerves II through XII grossly intact, motor grossly intact throughout PSYCH:  Cognitively intact, oriented to person place and time   EKG:  EKG is ordered today. The ekg ordered today demonstrates sinus rhythm at 97 bpm.  RA enlargement.   Recent Labs: 12/24/2014: Magnesium 2.0; TSH 1.590 01/01/2015: Hemoglobin 13.5; Platelets 179 02/27/2015: ALT 6; BUN 5*; Creat 0.73; Potassium 4.5; Sodium 137    Lipid Panel    Component Value Date/Time   CHOL 187 09/16/2014 1032   TRIG 133 09/16/2014 1032   HDL 30* 09/16/2014 1032   CHOLHDL 6.2 09/16/2014 1032   VLDL 27 09/16/2014 1032   LDLCALC 130* 09/16/2014 1032  Wt Readings from Last 3 Encounters:  03/19/15 50.576 kg (111 lb 8 oz)  03/16/15 49.76 kg (109 lb 11.2 oz)  03/11/15 51.438 kg (113 lb 6.4 oz)      ASSESSMENT AND PLAN:  # Presyncope:  [name] is mildly orthostatic on exam today.  Her symptoms sound like vagal episodes given the diaphoresis, nausea and near syncope.  She likely has a combination of neurocardiogenic and orthostatic hypotension.  She does a good job with remaining hydrated but it sounds like she does not eat much.  She has been struggling with difficulty swallowing and is following up with gastroenterology. Given that she was othostatic, we will start low-dose florinef 0.1 mg daily.  She will follow up in one week for a BMP. I recommended that she work on improving her PO intake. We will also obtain a 48 hour Holter to determine if arrhythmia is contributing to her symptoms.  Of note, Subutex can also cause hypotension.  However she started taking this in February this year and her symptoms started October last year.  # Abdominal pulsation: This is likely due to her slim body habitus.  I do not feel a mass but I am able to palpate her aorta.  This is likely just her normal-sized aorta, but we will obtain an abdominal ultrasound to ensure she does not have a AAA.  # Chest pain: Symptoms are atypical and  unlikely to represent CAD.  However, she is at higher risk of premature athereosclerosis given that she has HIV.  We will obtain a treadmill stress test to rule out obstructive coronary disease.  Lipids were at goal 08/2014.   Current medicines are reviewed at length with the patient today.  The patient does not have concerns regarding medicines.  The following changes have been made:  Start florinef 0.1 mg daily.  Labs/ tests ordered today include:   Orders Placed This Encounter  Procedures  . Basic metabolic panel  . Holter monitor - 48 hour  . Exercise Tolerance Test  . EKG 12-Lead     Disposition:   FU with Jesus Nevills C. Duke Salvia, MD in 3 months    Signed, Madilyn Hook, MD  03/19/2015 10:40 AM    [facility]

## 2015-03-19 NOTE — Telephone Encounter (Signed)
We received clarification from Dennison Mascotave Jenkins at the ID office where this started. She is a neurology sleep patient. No contact with us or the [facility]. Problem is to be addressed by neurology
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
## 2015-03-23 ENCOUNTER — Encounter: Payer: Self-pay | Admitting: Neurology

## 2015-03-24 ENCOUNTER — Encounter (HOSPITAL_COMMUNITY): Payer: Self-pay | Admitting: *Deleted

## 2015-03-24 ENCOUNTER — Emergency Department (HOSPITAL_COMMUNITY)
Admission: EM | Admit: 2015-03-24 | Discharge: 2015-03-25 | Disposition: A | Discharge status: Home / Self Care | Payer: Medicare Other | Attending: Emergency Medicine | Admitting: Emergency Medicine

## 2015-03-24 DIAGNOSIS — R220 Localized swelling, mass and lump, head: Secondary | ICD-10-CM | POA: Insufficient documentation

## 2015-03-24 DIAGNOSIS — G8929 Other chronic pain: Secondary | ICD-10-CM | POA: Insufficient documentation

## 2015-03-24 DIAGNOSIS — R51 Headache: Secondary | ICD-10-CM | POA: Insufficient documentation

## 2015-03-24 DIAGNOSIS — Z8679 Personal history of other diseases of the circulatory system: Secondary | ICD-10-CM | POA: Diagnosis not present

## 2015-03-24 DIAGNOSIS — H9209 Otalgia, unspecified ear: Secondary | ICD-10-CM | POA: Insufficient documentation

## 2015-03-24 DIAGNOSIS — F419 Anxiety disorder, unspecified: Secondary | ICD-10-CM | POA: Insufficient documentation

## 2015-03-24 DIAGNOSIS — Z8701 Personal history of pneumonia (recurrent): Secondary | ICD-10-CM | POA: Diagnosis not present

## 2015-03-24 DIAGNOSIS — M542 Cervicalgia: Secondary | ICD-10-CM | POA: Insufficient documentation

## 2015-03-24 DIAGNOSIS — Z7952 Long term (current) use of systemic steroids: Secondary | ICD-10-CM | POA: Insufficient documentation

## 2015-03-24 DIAGNOSIS — Z79899 Other long term (current) drug therapy: Secondary | ICD-10-CM | POA: Insufficient documentation

## 2015-03-24 DIAGNOSIS — B20 Human immunodeficiency virus [HIV] disease: Secondary | ICD-10-CM | POA: Insufficient documentation

## 2015-03-24 DIAGNOSIS — F17210 Nicotine dependence, cigarettes, uncomplicated: Secondary | ICD-10-CM | POA: Diagnosis not present

## 2015-03-24 DIAGNOSIS — Z8719 Personal history of other diseases of the digestive system: Secondary | ICD-10-CM | POA: Insufficient documentation

## 2015-03-24 DIAGNOSIS — Z8781 Personal history of (healed) traumatic fracture: Secondary | ICD-10-CM | POA: Insufficient documentation

## 2015-03-24 DIAGNOSIS — Z3202 Encounter for pregnancy test, result negative: Secondary | ICD-10-CM | POA: Insufficient documentation

## 2015-03-24 DIAGNOSIS — R519 Headache, unspecified: Secondary | ICD-10-CM

## 2015-03-24 DIAGNOSIS — R202 Paresthesia of skin: Secondary | ICD-10-CM | POA: Diagnosis present

## 2015-03-24 DIAGNOSIS — F329 Major depressive disorder, single episode, unspecified: Secondary | ICD-10-CM | POA: Insufficient documentation

## 2015-03-24 LAB — URINALYSIS, ROUTINE W REFLEX MICROSCOPIC
Bilirubin Urine: NEGATIVE
Glucose, UA: NEGATIVE mg/dL
Hgb urine dipstick: NEGATIVE
KETONES UR: NEGATIVE mg/dL
LEUKOCYTES UA: NEGATIVE
NITRITE: NEGATIVE
PH: 7.5 (ref 5.0–8.0)
Protein, ur: NEGATIVE mg/dL
SPECIFIC GRAVITY, URINE: 1.007 (ref 1.005–1.030)
Urobilinogen, UA: 0.2 mg/dL (ref 0.0–1.0)

## 2015-03-24 LAB — POC URINE PREG, ED: Preg Test, Ur: NEGATIVE

## 2015-03-24 NOTE — ED Notes (Signed)
Spoke with Dr Denton LankSteinl and no further orders at this time

## 2015-03-24 NOTE — ED Notes (Signed)
Patient presents stating about 6pm noticed the left side of her face became tingling and tender to touch.  Noticed a lymph node on the left side of neck.  States she feels like there is a film over her eye

## 2015-03-24 NOTE — ED Provider Notes (Signed)
CSN: [id number]     Arrival date & time 03/24/15  1918 History  By signing my name below, I, Lyndel Safe, attest that this documentation has been prepared under the direction and in the presence of Loren Racer, MD. Electronically Signed: Lyndel Safe, ED Scribe. 03/24/2015. 12:07 AM.   Chief Complaint  Patient presents with  . Left side of face tingling    The history is provided by the patient. No language interpreter was used.   Marland KitchenHPI Comments: [name] is a 34 y.o. female, with a PMhx of HIV, migraines, seizures, depression and anxiety, and recent history of IVDA, who presents to the Emergency Department complaining of sudden onset, constant mild swelling and moderate pain to left side of face that extends to the musculature of left, lateral neck onset 6 hours ago. She  associates warmth to the area at onset which has since resolved. Now, the pt associates left-sided otalgia. She has not taken any alleviating medication for her symptoms. Pt denies similar experiences in the past. She describes her pre seizure auras to be flashing lights and then darkness but denies these auras tonight. She endorses titration increase of Lamictal over the past 2 weeks but denies any new medications, new hygienic product usage, new chemical exposures, or new environments. Also denies trismus, any symptoms to right side of face, edema or pain to BLE, numbness or tingling to the affected area.  Pt reports her last viral load was undetectable but is unsure of her last CD4 count.  She has an appointment with cardiology in the morning for an [place] to rule out AAA, per patient.   Past Medical History  Diagnosis Date  . HIV infection (HCC) 2016  . Anxiety   . Arthritis 2000    Right wrist/hand, prior fracture  . Chronic headaches 2005    Migraines  . Depression 2001  . Gallstones   . Pneumonia   . Seizures (HCC)   . 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  Sleep apnea    Past Surgical History  Procedure Laterality Date  . Cholecystectomy    . Fracture surgery      2-right hand  . Ureter surgery      Placement and removal   . Rotator cuff repair Right 2000    repair frozen shoulder 2001   Family History  Problem Relation Age of Onset  . Diabetes Maternal Grandfather   . Heart disease Maternal Grandfather   . Seizures Neg Hx   . Breast cancer Mother    Social History  Substance Use Topics  . Smoking status: Current Every Day Smoker -- 1.00 packs/day for 20 years    Types: Cigarettes  . Smokeless tobacco: Never Used  . Alcohol Use: No   OB History    No data available     Review of Systems  Constitutional: Negative for fever and chills.  HENT: Positive for ear pain and facial swelling. Negative for hearing loss, sore throat, trouble swallowing and voice change.   Eyes: Negative for visual disturbance.  Respiratory: Negative for cough and shortness of breath.   Cardiovascular: Negative for leg swelling.  Gastrointestinal: Negative for nausea, vomiting, abdominal pain, diarrhea and constipation.  Musculoskeletal: Positive for neck pain. Negative for myalgias, back pain and neck stiffness.  Skin: Negative for rash and wound.  Neurological: Negative for dizziness, seizures, syncope, weakness, numbness and headaches.  All other systems reviewed and are  negative.  Allergies  Robinul  Home Medications   Prior to Admission medications   Medication Sig Start Date End Date Taking? Authorizing Provider  buprenorphine (SUBUTEX) 8 MG SUBL SL tablet Place 8 mg under the tongue 3 (three) times daily.     Historical Provider, MD  dolutegravir (TIVICAY) 50 MG tablet Take 1 tablet (50 mg total) by mouth daily. 10/08/14   Randall Hiss, MD  fludrocortisone (FLORINEF) 0.1 MG tablet Take 1 tablet (0.1 mg total) by mouth daily. 03/19/15   Chilton Si, MD  lamivudine (EPIVIR) 300 MG tablet Take 1 tablet (300 mg total) by mouth  daily. 10/08/14   Randall Hiss, MD  lamoTRIgine (LAMICTAL) 25 MG tablet First  2 weeks:  daily (1 pill), Weeks 3 and 4:  daily (2 pills) , Weeks 5 and 6:  twice daily (2 pills twice daily) , Weeks 7 and 8:  twice daily (4 pills twice daily) 02/24/15   Anson Fret, MD  levETIRAcetam (KEPPRA) 500 MG tablet Take 500 mg by mouth 2 (two) times daily.  03/06/15   Historical Provider, MD  pantoprazole (PROTONIX) 40 MG tablet Take 1 tablet (40 mg total) by mouth daily. 12/29/14   Louis Meckel, MD  tamsulosin (FLOMAX) 0.4 MG CAPS capsule Take 0.4 mg by mouth daily. 01/22/15   Historical Provider, MD  traZODone (DESYREL) 100 MG tablet Take 200 mg by mouth at bedtime as needed. 02/03/15   Historical Provider, MD   BP 128/73 mmHg  Pulse 59  Temp(Src) 98.3 F (36.8 C) (Oral)  Resp 18  SpO2 98% Physical Exam  Constitutional: She is oriented to person, place, and time. She appears well-developed and well-nourished. No distress.  HENT:  Head: Normocephalic and atraumatic.  Right Ear: External ear normal.  Left Ear: External ear normal.  Mouth/Throat: Oropharynx is clear and moist. No oropharyngeal exudate.  No facial swelling noted. No intraoral swelling noted. Bilateral TMs are normal and symmetrical. Patient has tenderness to palpation over the left zygomatic arch and left mandible. There is no obvious deformity or trauma. No overlying erythema, induration or warmth.  Eyes: EOM are normal. Pupils are equal, round, and reactive to light.  Neck: Normal range of motion. Neck supple. No JVD present.  No meningismus. No cervical adenopathy. Patient does have mild tenderness to palpation over the left sternocleidomastoid  Cardiovascular: Normal rate and regular rhythm.  Exam reveals no gallop and no friction rub.   No murmur heard. Pulmonary/Chest: Effort normal and breath sounds normal. No respiratory distress. She has no wheezes. She has no rales. She exhibits no tenderness.   Abdominal: Soft. Bowel sounds are normal. She exhibits no distension and no mass. There is no tenderness. There is no rebound and no guarding.  Musculoskeletal: Normal range of motion. She exhibits no edema or tenderness.  Lymphadenopathy:    She has no cervical adenopathy.  Neurological: She is alert and oriented to person, place, and time.  Patient is alert and oriented x3 with clear, goal oriented speech. Patient has 5/5 motor in all extremities. Sensation is intact to light touch.  Skin: Skin is warm and dry. No rash noted. No erythema.  Psychiatric: She has a normal mood and affect. Her behavior is normal.  Nursing note and vitals reviewed.   ED Course  Procedures  DIAGNOSTIC STUDIES: Oxygen Saturation is 98% on RA, normal by my interpretation.    COORDINATION OF CARE: 11:58 PM Discussed treatment plan with pt at bedside and  pt agreed to plan.   Labs Review Labs Reviewed  COMPREHENSIVE METABOLIC PANEL - Abnormal; Notable for the following:    BUN <5 (*)    Total Protein 6.3 (*)    AST 13 (*)    ALT 9 (*)    All other components within normal limits  URINALYSIS, ROUTINE W REFLEX MICROSCOPIC (NOT AT [facility])  CBC WITH DIFFERENTIAL/PLATELET  LAMOTRIGINE LEVEL  POC URINE PREG, ED    Imaging Review No results found. I have personally reviewed and evaluated these images and lab results as part of my medical decision-making.   EKG Interpretation None      MDM   Final diagnoses:  Left facial pain    I personally performed the services described in this documentation, which was scribed in my presence. The recorded information has been reviewed and is accurate.   No abnormality in lab work found. Lamictal level sent but will need to be followed up as an outpatient. Patient with a normal neurologic exam. Became agitated waiting for results of testing. Stating "didn't do anything for her." Patient was advised to follow-up with her primary physician and return precautions  given.  Loren Raceravid Emmett Bracknell, MD 03/25/15 [phone]

## 2015-03-25 ENCOUNTER — Other Ambulatory Visit: Payer: Self-pay | Admitting: Cardiovascular Disease

## 2015-03-25 ENCOUNTER — Ambulatory Visit (HOSPITAL_BASED_OUTPATIENT_CLINIC_OR_DEPARTMENT_OTHER)
Admission: RE | Admit: 2015-03-25 | Discharge: 2015-03-25 | Disposition: A | Discharge status: Home / Self Care | Payer: Medicare Other | Source: Ambulatory Visit | Attending: Cardiology | Admitting: Cardiology

## 2015-03-25 DIAGNOSIS — R1900 Intra-abdominal and pelvic swelling, mass and lump, unspecified site: Secondary | ICD-10-CM | POA: Diagnosis not present

## 2015-03-25 DIAGNOSIS — R0602 Shortness of breath: Secondary | ICD-10-CM

## 2015-03-25 DIAGNOSIS — R51 Headache: Secondary | ICD-10-CM | POA: Diagnosis not present

## 2015-03-25 DIAGNOSIS — R42 Dizziness and giddiness: Secondary | ICD-10-CM

## 2015-03-25 DIAGNOSIS — R002 Palpitations: Secondary | ICD-10-CM

## 2015-03-25 DIAGNOSIS — R072 Precordial pain: Secondary | ICD-10-CM | POA: Diagnosis not present

## 2015-03-25 LAB — CBC WITH DIFFERENTIAL/PLATELET
BASOS ABS: 0 10*3/uL (ref 0.0–0.1)
BASOS PCT: 0 %
Eosinophils Absolute: 0.3 10*3/uL (ref 0.0–0.7)
Eosinophils Relative: 7 %
HEMATOCRIT: 38.6 % (ref 36.0–46.0)
Hemoglobin: 12.3 g/dL (ref 12.0–15.0)
LYMPHS PCT: 41 %
Lymphs Abs: 2 10*3/uL (ref 0.7–4.0)
MCH: 30.9 pg (ref 26.0–34.0)
MCHC: 31.9 g/dL (ref 30.0–36.0)
MCV: 97 fL (ref 78.0–100.0)
MONO ABS: 0.4 10*3/uL (ref 0.1–1.0)
Monocytes Relative: 8 %
NEUTROS ABS: 2.2 10*3/uL (ref 1.7–7.7)
Neutrophils Relative %: 44 %
PLATELETS: 173 10*3/uL (ref 150–400)
RBC: 3.98 MIL/uL (ref 3.87–5.11)
RDW: 12.1 % (ref 11.5–15.5)
WBC: 5 10*3/uL (ref 4.0–10.5)

## 2015-03-25 LAB — COMPREHENSIVE METABOLIC PANEL
ALBUMIN: 3.7 g/dL (ref 3.5–5.0)
ALT: 9 U/L — AB (ref 14–54)
AST: 13 U/L — AB (ref 15–41)
Alkaline Phosphatase: 71 U/L (ref 38–126)
Anion gap: 7 (ref 5–15)
BILIRUBIN TOTAL: 0.7 mg/dL (ref 0.3–1.2)
CHLORIDE: 101 mmol/L (ref 101–111)
CO2: 29 mmol/L (ref 22–32)
CREATININE: 0.71 mg/dL (ref 0.44–1.00)
Calcium: 9 mg/dL (ref 8.9–10.3)
GFR calc Af Amer: >60 mL/min (ref >60)
GFR calc non Af Amer: >60 mL/min (ref >60)
GLUCOSE: 91 mg/dL (ref 65–99)
POTASSIUM: 4 mmol/L (ref 3.5–5.1)
Sodium: 137 mmol/L (ref 135–145)
Total Protein: 6.3 g/dL — ABNORMAL LOW (ref 6.5–8.1)

## 2015-03-25 NOTE — Discharge Instructions (Signed)
Follow-up with your primary physician regarding your Lamictal level. You may take ibuprofen or Tylenol for pain.

## 2015-03-25 NOTE — ED Notes (Signed)
Pt very upset at dc time, states that we do nothing for her and that she will be better staying on her house, when trying to give dc instructions pt got more upset and accused this nurse, pt started yelling "you just flipped me out" and got out her room with out signing dc instructions cursing out.

## 2015-03-26 ENCOUNTER — Other Ambulatory Visit: Payer: Medicare Other | Admitting: *Deleted

## 2015-03-26 ENCOUNTER — Ambulatory Visit (INDEPENDENT_AMBULATORY_CARE_PROVIDER_SITE_OTHER): Payer: Medicare Other

## 2015-03-26 DIAGNOSIS — R1900 Intra-abdominal and pelvic swelling, mass and lump, unspecified site: Secondary | ICD-10-CM

## 2015-03-26 DIAGNOSIS — R0602 Shortness of breath: Secondary | ICD-10-CM

## 2015-03-26 DIAGNOSIS — R002 Palpitations: Secondary | ICD-10-CM | POA: Diagnosis not present

## 2015-03-26 DIAGNOSIS — R072 Precordial pain: Secondary | ICD-10-CM | POA: Diagnosis not present

## 2015-03-26 DIAGNOSIS — R42 Dizziness and giddiness: Secondary | ICD-10-CM

## 2015-03-26 LAB — BASIC METABOLIC PANEL
BUN: 6 mg/dL — ABNORMAL LOW (ref 7–25)
CHLORIDE: 104 mmol/L (ref 98–110)
CO2: 27 mmol/L (ref 20–31)
CREATININE: 0.78 mg/dL (ref 0.50–1.10)
Calcium: 9.1 mg/dL (ref 8.6–10.2)
Glucose, Bld: 113 mg/dL — ABNORMAL HIGH (ref 65–99)
POTASSIUM: 4.9 mmol/L (ref 3.5–5.3)
Sodium: 137 mmol/L (ref 135–146)

## 2015-03-26 LAB — LAMOTRIGINE LEVEL: LAMOTRIGINE LVL: 2.5 ug/mL (ref 2.0–20.0)

## 2015-04-01 ENCOUNTER — Telehealth: Payer: Self-pay | Admitting: *Deleted

## 2015-04-01 NOTE — Telephone Encounter (Signed)
-----   Message from Chilton Siiffany [place], MD sent at 03/30/2015  3:59 PM EDT ----- Aorta is normal.

## 2015-04-01 NOTE — Telephone Encounter (Signed)
-----   Message from Chilton Siiffany [place], MD sent at 03/30/2015  5:22 PM EDT ----- Labs normal.

## 2015-04-01 NOTE — Telephone Encounter (Signed)
[facility] to [email or web address] Results given . Verbalized understanding

## 2015-04-07 ENCOUNTER — Institutional Professional Consult (permissible substitution): Payer: Medicare Other | Admitting: Neurology

## 2015-04-08 ENCOUNTER — Encounter: Payer: Self-pay | Admitting: Neurology

## 2015-04-08 ENCOUNTER — Ambulatory Visit (INDEPENDENT_AMBULATORY_CARE_PROVIDER_SITE_OTHER): Payer: Medicare Other | Admitting: Neurology

## 2015-04-08 VITALS — BP 129/77 | HR 66 | Ht 66.0 in | Wt 113.4 lb

## 2015-04-08 DIAGNOSIS — R569 Unspecified convulsions: Secondary | ICD-10-CM | POA: Diagnosis not present

## 2015-04-08 MED ORDER — LAMOTRIGINE 100 MG PO TABS: 100.0000 mg | ORAL_TABLET | Freq: Every day | ORAL | Status: DC
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
## 2015-04-13 ENCOUNTER — Ambulatory Visit (INDEPENDENT_AMBULATORY_CARE_PROVIDER_SITE_OTHER): Payer: Medicare Other | Admitting: Infectious Disease

## 2015-04-13 ENCOUNTER — Encounter: Payer: Self-pay | Admitting: Infectious Disease

## 2015-04-13 VITALS — BP 130/82 | HR 58 | Temp 97.7°F | Ht 66.0 in | Wt 114.8 lb

## 2015-04-13 DIAGNOSIS — F1990 Other psychoactive substance use, unspecified, uncomplicated: Secondary | ICD-10-CM | POA: Diagnosis not present

## 2015-04-13 DIAGNOSIS — Z87898 Personal history of other specified conditions: Secondary | ICD-10-CM

## 2015-04-13 DIAGNOSIS — F1991 Other psychoactive substance use, unspecified, in remission: Secondary | ICD-10-CM

## 2015-04-13 DIAGNOSIS — Z23 Encounter for immunization: Secondary | ICD-10-CM | POA: Diagnosis not present

## 2015-04-13 DIAGNOSIS — F250 Schizoaffective disorder, bipolar type: Secondary | ICD-10-CM

## 2015-04-13 DIAGNOSIS — R569 Unspecified convulsions: Secondary | ICD-10-CM

## 2015-04-13 DIAGNOSIS — B20 Human immunodeficiency virus [HIV] disease: Secondary | ICD-10-CM | POA: Diagnosis present

## 2015-04-13 NOTE — Progress Notes (Signed)
Chief complaint: followup for HIV on medications  Subjective:    Patient ID: [name], female    DOB: [birth year], 34 y.o.   MRN: [id number]  HPI  [name] with HIV infection recently diagnosed and started on anti-retroviral indications in the form of TIVICAY and Epivir via the AIDS clinical trial group.   She is doing very well and is having her seizures managed by Neurology and also seeing a psychiatrist and is on suboxone for opiate dependence as well as alpha blocker for urinary retention.  Past Medical History  Diagnosis Date  . HIV infection (HCC) 2016  . Anxiety   . Arthritis 2000    Right wrist/hand, prior fracture  . Chronic headaches 2005    Migraines  . Depression 2001  . Gallstones   . Pneumonia   . Seizures (HCC)   . 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 Sleep apnea     Past Surgical History  Procedure Laterality Date  . Cholecystectomy    . Fracture surgery      2-right hand  . Ureter surgery      Placement and removal   . Rotator cuff repair Right 2000    repair frozen shoulder 2001    Family History  Problem Relation Age of Onset  . Diabetes Maternal Grandfather   . Heart disease Maternal Grandfather   . Seizures Neg Hx   . Breast cancer Mother       Social History   Social History  . Marital Status: Married    Spouse Name: [name]  . Number of Children: 2  . Years of Education: 12+   Occupational History  . Unemployed    Social History Main Topics  . Smoking status: Current Every Day Smoker -- 1.00 packs/day for 20 years    Types: Cigarettes  . Smokeless tobacco: Never Used  . Alcohol Use: No  . Drug Use: 7.00 per week    Special: Marijuana, Heroin  . Sexual Activity: Not Currently    Birth Control/ Protection: None     Comment: declined condoms   Other Topics Concern  . None   Social History Narrative   Lives at home with kids   Caffeine use: Drinks coffee  (1 cup per day)    Allergies  Allergen Reactions  . Robinul [Glycopyrrolate] Other (See Comments)    Urinary retention     Current outpatient prescriptions:  .  buprenorphine (SUBUTEX) 8 MG SUBL SL tablet, Place 8 mg under the tongue 3 (three) times daily. , Disp: , Rfl:  .  dolutegravir (TIVICAY) 50 MG tablet, Take 1 tablet (50 mg total) by mouth daily., Disp: 30 tablet, Rfl: 11 .  fludrocortisone (FLORINEF) 0.1 MG tablet, Take 1 tablet (0.1 mg total) by mouth daily., Disp: 30 tablet, Rfl: 6 .  lamivudine (EPIVIR) 300 MG tablet, Take 1 tablet (300 mg total) by mouth daily., Disp: 30 tablet, Rfl: 11 .  lamoTRIgine (LAMICTAL) 100 MG tablet, Take 1 tablet (100 mg total) by mouth daily., Disp: 30 tablet, Rfl: 11 .  levETIRAcetam (KEPPRA) 500 MG tablet, Take 500 mg by mouth 2 (two) times daily. , Disp: , Rfl:  .  pantoprazole (PROTONIX) 40 MG tablet, Take 1 tablet (40 mg total) by mouth daily., Disp: 90 tablet, Rfl: 3 .  tamsulosin (FLOMAX) 0.4 MG CAPS capsule, Take 0.4 mg by mouth daily., Disp: , Rfl:  .  traZODone (  DESYREL) 100 MG tablet, Take 200 mg by mouth at bedtime as needed., Disp: , Rfl:     Review of Systems  Constitutional: Negative for chills, diaphoresis, activity change, fatigue and unexpected weight change.  HENT: Negative for rhinorrhea, sinus pressure, sneezing and sore throat.   Eyes: Negative for photophobia and visual disturbance.  Respiratory: Negative for chest tightness and wheezing.   Cardiovascular: Negative for chest pain, palpitations and leg swelling.  Gastrointestinal: Negative for blood in stool, abdominal distention and anal bleeding.  Genitourinary: Negative for flank pain and difficulty urinating.  Musculoskeletal: Negative for back pain, joint swelling and gait problem.  Skin: Negative for color change, pallor, rash and wound.  Neurological: Negative for dizziness, tremors, seizures, weakness and light-headedness.  Hematological: Negative for  adenopathy. Does not bruise/bleed easily.  Psychiatric/Behavioral: Negative for behavioral problems, confusion, sleep disturbance, dysphoric mood, decreased concentration and agitation.       Objective:   Physical Exam  Constitutional: She is oriented to person, place, and time. She appears well-developed and well-nourished. No distress.  HENT:  Head: Normocephalic and atraumatic.  Mouth/Throat: No oropharyngeal exudate.  Eyes: Conjunctivae and EOM are normal. No scleral icterus.  Neck: Normal range of motion. Neck supple.  Cardiovascular: Normal rate and regular rhythm.   Pulmonary/Chest: Effort normal. No respiratory distress. She has no wheezes.  Abdominal: Soft. She exhibits no distension, no pulsatile liver, no fluid wave, no ascites and no mass. There is tenderness in the left upper quadrant. There is no rigidity, no rebound, no guarding, no CVA tenderness, no tenderness at McBurney's point and negative Murphy's sign.  Musculoskeletal: She exhibits no edema or tenderness.  Neurological: She is alert and oriented to person, place, and time. She exhibits normal muscle tone. Coordination normal.  Skin: Skin is warm and dry. No rash noted. She is not diaphoretic. No erythema. No pallor.  Psychiatric: She has a normal mood and affect. Her behavior is normal. Judgment and thought content normal.          Assessment & Plan:  HIV: continue Tivicay and 3tc. Her viral load is undetectable and CD4 healthy  Opioid maintenance being seen by clinic here in town.  Depression schizoaffective disorder: seeing psychiatry  Seizures: on AEDs and they do not appear to  interact with her TIVICAY  I spent greater than 25 minutes with the patient including greater than 50% of time in face to face counsel of the patient re her HIV,depression, seizures, and in coordination of her care.

## 2015-04-14 ENCOUNTER — Encounter (HOSPITAL_COMMUNITY): Payer: Self-pay | Admitting: Emergency Medicine

## 2015-04-14 ENCOUNTER — Ambulatory Visit
Admission: RE | Admit: 2015-04-14 | Discharge: 2015-04-14 | Disposition: A | Discharge status: Home / Self Care | Payer: Medicare Other | Source: Ambulatory Visit | Attending: Family Medicine | Admitting: Family Medicine

## 2015-04-14 ENCOUNTER — Emergency Department (HOSPITAL_COMMUNITY)
Admission: EM | Admit: 2015-04-14 | Discharge: 2015-04-14 | Disposition: A | Discharge status: Home / Self Care | Payer: Medicare Other | Attending: Emergency Medicine | Admitting: Emergency Medicine

## 2015-04-14 ENCOUNTER — Encounter: Payer: Self-pay | Admitting: Family Medicine

## 2015-04-14 ENCOUNTER — Ambulatory Visit (INDEPENDENT_AMBULATORY_CARE_PROVIDER_SITE_OTHER): Payer: Medicare Other | Admitting: Family Medicine

## 2015-04-14 VITALS — BP 141/75 | HR 56 | Temp 98.3°F | Wt 111.0 lb

## 2015-04-14 DIAGNOSIS — Z8701 Personal history of pneumonia (recurrent): Secondary | ICD-10-CM | POA: Insufficient documentation

## 2015-04-14 DIAGNOSIS — G40909 Epilepsy, unspecified, not intractable, without status epilepticus: Secondary | ICD-10-CM | POA: Insufficient documentation

## 2015-04-14 DIAGNOSIS — Y998 Other external cause status: Secondary | ICD-10-CM | POA: Insufficient documentation

## 2015-04-14 DIAGNOSIS — G8929 Other chronic pain: Secondary | ICD-10-CM | POA: Insufficient documentation

## 2015-04-14 DIAGNOSIS — M79642 Pain in left hand: Secondary | ICD-10-CM | POA: Diagnosis present

## 2015-04-14 DIAGNOSIS — Y9389 Activity, other specified: Secondary | ICD-10-CM | POA: Diagnosis not present

## 2015-04-14 DIAGNOSIS — F329 Major depressive disorder, single episode, unspecified: Secondary | ICD-10-CM | POA: Diagnosis not present

## 2015-04-14 DIAGNOSIS — Z8781 Personal history of (healed) traumatic fracture: Secondary | ICD-10-CM | POA: Insufficient documentation

## 2015-04-14 DIAGNOSIS — Z8739 Personal history of other diseases of the musculoskeletal system and connective tissue: Secondary | ICD-10-CM | POA: Diagnosis not present

## 2015-04-14 DIAGNOSIS — Z8719 Personal history of other diseases of the digestive system: Secondary | ICD-10-CM | POA: Diagnosis not present

## 2015-04-14 DIAGNOSIS — Y9289 Other specified places as the place of occurrence of the external cause: Secondary | ICD-10-CM | POA: Insufficient documentation

## 2015-04-14 DIAGNOSIS — X58XXXA Exposure to other specified factors, initial encounter: Secondary | ICD-10-CM | POA: Insufficient documentation

## 2015-04-14 DIAGNOSIS — F419 Anxiety disorder, unspecified: Secondary | ICD-10-CM | POA: Insufficient documentation

## 2015-04-14 DIAGNOSIS — S6992XA Unspecified injury of left wrist, hand and finger(s), initial encounter: Secondary | ICD-10-CM | POA: Diagnosis present

## 2015-04-14 DIAGNOSIS — Z79899 Other long term (current) drug therapy: Secondary | ICD-10-CM | POA: Insufficient documentation

## 2015-04-14 DIAGNOSIS — G43909 Migraine, unspecified, not intractable, without status migrainosus: Secondary | ICD-10-CM | POA: Diagnosis not present

## 2015-04-14 DIAGNOSIS — Z7952 Long term (current) use of systemic steroids: Secondary | ICD-10-CM | POA: Diagnosis not present

## 2015-04-14 DIAGNOSIS — B20 Human immunodeficiency virus [HIV] disease: Secondary | ICD-10-CM | POA: Insufficient documentation

## 2015-04-14 DIAGNOSIS — S63602A Unspecified sprain of left thumb, initial encounter: Secondary | ICD-10-CM | POA: Diagnosis not present

## 2015-04-14 DIAGNOSIS — F17210 Nicotine dependence, cigarettes, uncomplicated: Secondary | ICD-10-CM | POA: Diagnosis not present

## 2015-04-14 MED ORDER — NAPROXEN 250 MG PO TABS
375.0000 mg | ORAL_TABLET | Freq: Once | ORAL | Status: AC
  Administered 2015-04-14: 375 mg via ORAL
  Filled 2015-04-14: qty 2

## 2015-04-14 MED ORDER — CYCLOBENZAPRINE HCL 5 MG PO TABS: 5.0000 mg | ORAL_TABLET | Freq: Two times a day (BID) | ORAL | Status: DC | PRN

## 2015-04-14 MED ORDER — CYCLOBENZAPRINE HCL 10 MG PO TABS
5.0000 mg | ORAL_TABLET | Freq: Once | ORAL | Status: AC
  Administered 2015-04-14: 5 mg via ORAL
  Filled 2015-04-14: qty 1

## 2015-04-14 MED ORDER — WRIST SPLINT MISC: Status: DC

## 2015-04-14 MED ORDER — NAPROXEN 375 MG PO TABS: 375.0000 mg | ORAL_TABLET | Freq: Two times a day (BID) | ORAL | Status: DC

## 2015-04-14 NOTE — Progress Notes (Signed)
Date of Visit: 04/14/2015   HPI:  Pt presents for a same day appointment to discuss pain in L hand. Has had pain for about 1 week. no preceding injury. Pain located along thumb MCP joint, radiating proximally to wrist. Tender to palpation. Patient with history of IV drug abuse, taking buprenorphine and is not interested in pain medication at this time, just wants to know what is causing the pain. Has not worn a splint. Knows tylenol won't help her.   ROS: See HPI  PMFSH: history of schizoaffective disorder, HIV, seizures, depression, prior IVDA in remission  PHYSICAL EXAM: BP 141/75 mmHg  Pulse 56  Temp(Src) 98.3 F (36.8 C) (Oral)  Wt 111 lb (50.349 kg) Gen: NAD, pleasant, cooperative Extremities: hands atraumatic. Sensation intact over distal fingers. Grip 5/5 bilaterally. No snuff box tenderness on L. L thumb MCP joint TTP. Full ROM of L wrist. No erythema, warmth, or swelling.  ASSESSMENT/PLAN:  1. L hand/wrist pain - unclear etiology, no preceding trauma. Patient would like to undergo workup to determine etiology. - xray wrist and hand - rx given for thumb spica splint - further workup pending wrist/hand xray results  FOLLOW UP: Follow up pending results of xrays  GrenadaBrittany J. Pollie MeyerMcIntyre, MD [facility]

## 2015-04-14 NOTE — Progress Notes (Signed)
Orthopedic Tech Progress Note Patient Details:  [name] Nov 06, 1980 [id number]  Ortho Devices Type of Ortho Device: Thumb velcro splint Ortho Device/Splint Location: LUE Ortho Device/Splint Interventions: Ordered, Application   [name], [name] 04/14/2015, 9:21 PM

## 2015-04-14 NOTE — ED Notes (Signed)
Pt. reports left hand pain onset last week denies injury .

## 2015-04-14 NOTE — Patient Instructions (Signed)
Wear splint daily/nightly Get xrays Will call you or send you letter with results  Be well, Dr. Pollie MeyerMcIntyre

## 2015-04-14 NOTE — Addendum Note (Signed)
Addended by: Latrelle DodrillMCINTYRE, Jervis Trapani J on: 04/14/2015 05:15 PM   Modules accepted: Level of Service

## 2015-04-14 NOTE — ED Provider Notes (Signed)
History  By signing my name below, I, Shaivi Rothschild, attest that this documentation has been prepared under the direction and in the presence of Marlon Pel, PA-C. Electronically Signed: Karle Plumber, ED Scribe. 04/14/2015. 8:30 PM.  Chief Complaint  Patient presents with  . Hand Pain   The history is provided by the patient and medical records. No language interpreter was used.    HPI Comments:  [name] is a 34 y.o. HIV positive female who presents to the Emergency Department complaining of left thumb and hand pain that began approximately one week ago. She states the pain radiates up her left wrist. She reports associated swelling of the left hand and left wrist . Pt was seen at [facility] earlier today and received negative X-Rays of the left hand and left wrist and given a prescription for a thumb spica although does not mention this during exam. She has not taken anything for pain. Touching the area makes the pain worse. She denies alleviating factors. She denies fever, chills, nausea, vomiting, numbness, tingling or weakness of the left hand, thumb or wrist. She denies any known trauma or injury.  Past Medical History  Diagnosis Date  . HIV infection (HCC) 2016  . Anxiety   . Arthritis 2000    Right wrist/hand, prior fracture  . Chronic headaches 2005    Migraines  . Depression 2001  . Gallstones   . Pneumonia   . Seizures (HCC)   . 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 Sleep apnea    Past Surgical History  Procedure Laterality Date  . Cholecystectomy    . Fracture surgery      2-right hand  . Ureter surgery      Placement and removal   . Rotator cuff repair Right 2000    repair frozen shoulder 2001   Family History  Problem Relation Age of Onset  . Diabetes Maternal Grandfather   . Heart disease Maternal Grandfather   . Seizures Neg Hx   . Breast cancer Mother    Social History  Substance Use  Topics  . Smoking status: Current Every Day Smoker -- 0.00 packs/day for 0 years    Types: Cigarettes  . Smokeless tobacco: Never Used  . Alcohol Use: No   OB History    No data available     Review of Systems  Constitutional: Negative for fever and chills.  Gastrointestinal: Negative for nausea and vomiting.  Musculoskeletal: Positive for arthralgias.  Skin: Negative for wound.  Neurological: Negative for weakness and numbness.  All other systems reviewed and are negative.   Allergies  Robinul  Home Medications   Prior to Admission medications   Medication Sig Start Date End Date Taking? Authorizing Provider  buprenorphine (SUBUTEX) 8 MG SUBL SL tablet Place 8 mg under the tongue 3 (three) times daily.     Historical Provider, MD  cyclobenzaprine (FLEXERIL) 5 MG tablet Take 1 tablet (5 mg total) by mouth 2 (two) times daily as needed. 04/14/15   Heyli Min Neva Seat, PA-C  dolutegravir (TIVICAY) 50 MG tablet Take 1 tablet (50 mg total) by mouth daily. 10/08/14   Randall Hiss, MD  Elastic Bandages & Supports (WRIST SPLINT) MISC Dispense one L sided thumb spica splint. Wear daily. 04/14/15   Latrelle Dodrill, MD  fludrocortisone (FLORINEF) 0.1 MG tablet Take 1 tablet (0.1 mg total) by mouth daily. 03/19/15   Chilton Si, MD  lamivudine (EPIVIR) 300 MG tablet Take 1 tablet (300 mg total) by mouth daily. 10/08/14   Randall Hiss, MD  lamoTRIgine (LAMICTAL) 100 MG tablet Take 1 tablet (100 mg total) by mouth daily. 04/08/15   Anson Fret, MD  levETIRAcetam (KEPPRA) 500 MG tablet Take 500 mg by mouth 2 (two) times daily.  03/06/15   Historical Provider, MD  naproxen (NAPROSYN) 375 MG tablet Take 1 tablet (375 mg total) by mouth 2 (two) times daily. 04/14/15   Emani Morad Neva Seat, PA-C  pantoprazole (PROTONIX) 40 MG tablet Take 1 tablet (40 mg total) by mouth daily. 12/29/14   Louis Meckel, MD  tamsulosin (FLOMAX) 0.4 MG CAPS capsule Take 0.4 mg by mouth daily. 01/22/15    Historical Provider, MD  traZODone (DESYREL) 100 MG tablet Take 200 mg by mouth at bedtime as needed. 02/03/15   Historical Provider, MD   Triage Vitals: BP 144/97 mmHg  Pulse 59  Temp(Src) 98.2 F (36.8 C) (Oral)  Resp 18  Ht  (1.676 m)  Wt 114 lb (51.71 kg)  BMI 18.41 kg/m2  SpO2 98% Physical Exam  Constitutional: She is oriented to person, place, and time. She appears well-developed and well-nourished.  HENT:  Head: Normocephalic and atraumatic.  Eyes: EOM are normal.  Neck: Normal range of motion.  Cardiovascular: Normal rate.   Pulmonary/Chest: Effort normal.  Musculoskeletal:       Left hand: She exhibits decreased range of motion, tenderness and bony tenderness. She exhibits normal two-point discrimination, normal capillary refill, no deformity, no laceration and no swelling. Normal sensation noted. Normal strength noted.  Neurological: She is alert and oriented to person, place, and time.  Skin: Skin is warm and dry.  Psychiatric: She has a normal mood and affect. Her behavior is normal.  Nursing note and vitals reviewed.   ED Course  Procedures (including critical care time) DIAGNOSTIC STUDIES: Oxygen Saturation is 98% on RA, normal by my interpretation.   COORDINATION OF CARE: 8:27 PM- Will inform patient of negative X-Ray results and order thumb spica. Pt verbalizes understanding and agrees to plan.  Medications  cyclobenzaprine (FLEXERIL) tablet 5 mg (5 mg Oral Given 04/14/15 2057)  naproxen (NAPROSYN) tablet 375 mg (375 mg Oral Given 04/14/15 2057)    Labs Review Labs Reviewed - No data to display  Imaging Review Dg Wrist Complete Left  04/14/2015  CLINICAL DATA:  Left wrist pain centered over the navicular for the past week without known injury EXAM: LEFT WRIST - COMPLETE 3+ VIEW; LEFT HAND - COMPLETE 3+ VIEW COMPARISON:  None in PACs FINDINGS: Left wrist: The bones of the wrist are adequately mineralized. There is no acute fracture nor dislocation. The  joint spaces are preserved. The soft tissues are unremarkable. Specific attention to the scaphoid reveals no acute abnormality. Left hand: The bones of the hand are adequately mineralized. There is no acute fracture nor dislocation. There is minimal narrowing of the interphalangeal joints diffusely. The MCP joints are unremarkable. No significant degenerative change of the carpometacarpal joints is demonstrated. The soft tissues are unremarkable. IMPRESSION: There is no acute bony abnormality of the left wrist or left hand. Minimal interphalangeal joint space narrowing is observed diffusely. Electronically Signed   By: David  [place] M.D.   On: 04/14/2015 14:30   Dg Hand Complete Left  04/14/2015  CLINICAL DATA:  Left wrist pain centered over the navicular for the past week without known injury EXAM: LEFT WRIST - COMPLETE 3+ VIEW; LEFT HAND -  COMPLETE 3+ VIEW COMPARISON:  None in PACs FINDINGS: Left wrist: The bones of the wrist are adequately mineralized. There is no acute fracture nor dislocation. The joint spaces are preserved. The soft tissues are unremarkable. Specific attention to the scaphoid reveals no acute abnormality. Left hand: The bones of the hand are adequately mineralized. There is no acute fracture nor dislocation. There is minimal narrowing of the interphalangeal joints diffusely. The MCP joints are unremarkable. No significant degenerative change of the carpometacarpal joints is demonstrated. The soft tissues are unremarkable. IMPRESSION: There is no acute bony abnormality of the left wrist or left hand. Minimal interphalangeal joint space narrowing is observed diffusely. Electronically Signed   By: David  [place] M.D.   On: 04/14/2015 14:30   I have personally reviewed and evaluated these images and lab results as part of my medical decision-making.   EKG Interpretation None      MDM   Final diagnoses:  Thumb sprain, left, initial encounter   Normal xray, patient has no  significant findings on physical exam or xray. Etiology of pain is unclear but clinically her symptoms are consistent with sprain. Strict return to ED precautions: hot, worse swelling, systemic symptoms, rash.  Medications  cyclobenzaprine (FLEXERIL) tablet 5 mg (5 mg Oral Given 04/14/15 2057)  naproxen (NAPROSYN) tablet 375 mg (375 mg Oral Given 04/14/15 2057)    34 y.o.Derrick M Zygiel's medical screening exam was performed and I feel the patient has had an appropriate workup for their chief complaint at this time and likelihood of emergent condition existing is low. They have been counseled on decision, discharge, follow up and which symptoms necessitate immediate return to the emergency department. They or their family verbally stated understanding and agreement with plan and discharged in stable condition.   Vital signs are stable at discharge. Filed Vitals:   04/14/15 1931  BP: 144/97  Pulse: 59  Temp: 98.2 F (36.8 C)  Resp: 18    I personally performed the services described in this documentation, which was scribed in my presence. The recorded information has been reviewed and is accurate.   Marlon Peliffany Si Jachim, PA-C 04/14/15 2103  Donnetta HutchingBrian Cook, MD 04/14/15 [id number]

## 2015-04-14 NOTE — Discharge Instructions (Signed)
Thumb Sprain A thumb sprain is an injury to one of the strong bands of tissue (ligaments) that connect the bones in your thumb. The ligament can be stretched too much or it can tear. A tear can be either partial or complete. The severity of the sprain depends on how much of the ligament was damaged or torn. CAUSES A thumb sprain is often caused by a fall or an accident. If you extend your hands to catch an object or to protect yourself, the force of the impact can cause your ligament to stretch too much. This excess tension can also cause your ligament to tear. RISK FACTORS This injury is more likely to occur in people who play:  Sports that involve a greater risk of falling, such as skiing.  Sports that involve catching an object, such as basketball. SYMPTOMS Symptoms of this condition include:  Loss of motion in your thumb.  Bruising.  Tenderness.  Swelling. DIAGNOSIS This condition is diagnosed with a medical history and physical exam. You may also have an X-ray of your thumb. TREATMENT Treatment varies depending on the severity of your sprain. If your ligament is overstretched or partially torn, treatment usually involves keeping your thumb in a fixed position (immobilization) for a period of time. To help you do this, your health care provider will apply a bandage, cast, or splint to keep your thumb from moving until it heals. If your ligament is fully torn, you may need surgery to reconnect the ligament to the bone. After surgery, a cast or splint will be applied and will need to stay on your thumb while it heals. Your health care provider may also suggest exercises or physical therapy to strengthen your thumb. HOME CARE INSTRUCTIONS If You Have a Cast:  Do not stick anything inside the cast to scratch your skin. Doing that increases your risk of infection.  Check the skin around the cast every day. Report any concerns to your health care provider. You may put lotion on dry skin  around the edges of the cast. Do not apply lotion to the skin underneath the cast.  Keep the cast clean and dry. If You Have a Splint:  Wear it as directed by your health care provider. Remove it only as directed by your health care provider.  Loosen the splint if your fingers become numb and tingle, or if they turn cold and blue.  Keep the splint clean and dry. Bathing  Cover the bandage, cast, or splint with a watertight plastic bag to protect it from water while you take a bath or a shower. Do not let the bandage, cast, or splint get wet. Managing Pain, Stiffness, and Swelling   If directed, apply ice to the injured area (unless you have a cast):  Put ice in a plastic bag.  Place a towel between your skin and the bag.  Leave the ice on for 20 minutes, 2-3 times per day.  Move your fingers often to avoid stiffness and to lessen swelling.  Raise (elevate) the injured area above the level of your heart while you are sitting or lying down. Driving  Do not drive or operate heavy machinery while taking pain medicine.  Do not drive while wearing a cast or splint on a hand that you use for driving. General Instructions  Do not put pressure on any part of your cast or splint until it is fully hardened. This may take several hours.  Take medicines only as directed by your health   care provider. These include over-the-counter medicines and prescription medicines.  Keep all follow-up visits as directed by your health care provider. This is important.  Do any exercise or physical therapy as directed by your health care provider.  Do not wear rings on your injured thumb. SEEK MEDICAL CARE IF:  Your pain is not controlled with medicine.  Your bruising or swelling gets worse.  Your cast or splint is damaged. SEEK IMMEDIATE MEDICAL CARE IF:  Your thumb is numb or blue.  Your thumb feels colder than normal.   This information is not intended to replace advice given to you by  your health care provider. Make sure you discuss any questions you have with your health care provider.   Document Released: 06/23/2004 Document Revised: 09/30/2014 Document Reviewed: 02/25/2014 Elsevier Interactive Patient Education 2016 [employer]  

## 2015-04-21 ENCOUNTER — Telehealth: Payer: Self-pay | Admitting: *Deleted

## 2015-04-21 ENCOUNTER — Encounter (INDEPENDENT_AMBULATORY_CARE_PROVIDER_SITE_OTHER): Payer: Medicare Other | Admitting: *Deleted

## 2015-04-21 VITALS — BP 145/95 | HR 76 | Temp 98.2°F | Resp 16 | Wt 116.0 lb

## 2015-04-21 DIAGNOSIS — B20 Human immunodeficiency virus [HIV] disease: Secondary | ICD-10-CM

## 2015-04-21 DIAGNOSIS — Z006 Encounter for examination for normal comparison and control in clinical research program: Secondary | ICD-10-CM

## 2015-04-21 LAB — CBC WITH DIFFERENTIAL/PLATELET
BASOS ABS: 0 10*3/uL (ref 0.0–0.1)
BASOS PCT: 0 % (ref 0–1)
Eosinophils Absolute: 0.3 10*3/uL (ref 0.0–0.7)
Eosinophils Relative: 5 % (ref 0–5)
HEMATOCRIT: 44.4 % (ref 36.0–46.0)
HEMOGLOBIN: 14.9 g/dL (ref 12.0–15.0)
LYMPHS PCT: 25 % (ref 12–46)
Lymphs Abs: 1.6 10*3/uL (ref 0.7–4.0)
MCH: 31.4 pg (ref 26.0–34.0)
MCHC: 33.6 g/dL (ref 30.0–36.0)
MCV: 93.7 fL (ref 78.0–100.0)
MPV: 8.6 fL (ref 8.6–12.4)
Monocytes Absolute: 0.3 10*3/uL (ref 0.1–1.0)
Monocytes Relative: 4 % (ref 3–12)
NEUTROS ABS: 4.2 10*3/uL (ref 1.7–7.7)
NEUTROS PCT: 66 % (ref 43–77)
Platelets: 200 10*3/uL (ref 150–400)
RBC: 4.74 MIL/uL (ref 3.87–5.11)
RDW: 12.5 % (ref 11.5–15.5)
WBC: 6.3 10*3/uL (ref 4.0–10.5)

## 2015-04-21 LAB — COMPREHENSIVE METABOLIC PANEL
ALBUMIN: 4.5 g/dL (ref 3.6–5.1)
ALK PHOS: 82 U/L (ref 33–115)
ALT: 10 U/L (ref 6–29)
AST: 14 U/L (ref 10–30)
BILIRUBIN TOTAL: 0.5 mg/dL (ref 0.2–1.2)
BUN: 5 mg/dL — ABNORMAL LOW (ref 7–25)
CALCIUM: 9.5 mg/dL (ref 8.6–10.2)
CO2: 27 mmol/L (ref 20–31)
CREATININE: 0.65 mg/dL (ref 0.50–1.10)
Chloride: 102 mmol/L (ref 98–110)
GLUCOSE: 92 mg/dL (ref 65–99)
Potassium: 4.6 mmol/L (ref 3.5–5.3)
SODIUM: 140 mmol/L (ref 135–146)
Total Protein: 7 g/dL (ref 6.1–8.1)

## 2015-04-21 LAB — BILIRUBIN, DIRECT: Bilirubin, Direct: 0.1 mg/dL (ref <=0.2)

## 2015-04-21 LAB — HIV-1 RNA QUANT-NO REFLEX-BLD: HIV-1 RNA Viral Load: <40

## 2015-04-21 NOTE — Progress Notes (Signed)
Sally DikeJennifer is here for a visit for [phone], a Study to Evaluate Dolutegravir and lamivudine Dual Therapy for the Treatment of Naive HIV-1 Infected Participants. She said she had some nausea and vomiting this morning but feels better now. She continues to complain of a multitude of symptoms. She recently was started on Florinef for hypotension but her BP today is 145/95, rechecked and it was 157/96. She said she took the medication last night at bedtime. She has a stress test scheduled for next week. She continues to be very adherent with her medications and will return in 8 weeks for the next visit.

## 2015-04-21 NOTE — Telephone Encounter (Signed)
Spoke to patient. Result given . Verbalized understanding  

## 2015-04-21 NOTE — Telephone Encounter (Signed)
-----   Message from Chilton Siiffany [place], MD sent at 04/19/2015  7:18 PM EST ----- Monitor showed occasional early beats from the top chambers of the heart.  This is not dangerous.  No abnormal rhythms were noted when she had symptoms of dizziness and chest pain.  Will discuss more at follow up.

## 2015-04-22 ENCOUNTER — Telehealth (HOSPITAL_COMMUNITY): Payer: Self-pay

## 2015-04-22 NOTE — Telephone Encounter (Signed)
Encounter complete. 

## 2015-04-28 ENCOUNTER — Ambulatory Visit (HOSPITAL_COMMUNITY)
Admission: RE | Admit: 2015-04-28 | Discharge: 2015-04-28 | Disposition: A | Discharge status: Home / Self Care | Payer: Medicare Other | Source: Ambulatory Visit | Attending: Cardiovascular Disease | Admitting: Cardiovascular Disease

## 2015-04-28 DIAGNOSIS — R0602 Shortness of breath: Secondary | ICD-10-CM

## 2015-04-28 DIAGNOSIS — R072 Precordial pain: Secondary | ICD-10-CM | POA: Diagnosis present

## 2015-04-28 DIAGNOSIS — R1900 Intra-abdominal and pelvic swelling, mass and lump, unspecified site: Secondary | ICD-10-CM

## 2015-04-28 DIAGNOSIS — R002 Palpitations: Secondary | ICD-10-CM

## 2015-04-28 LAB — EXERCISE TOLERANCE TEST
CHL CUP MPHR: 185 {beats}/min
CHL CUP RESTING HR STRESS: 71 {beats}/min
CHL RATE OF PERCEIVED EXERTION: 16
CSEPHR: 88 %
Estimated workload: 13.4 METS
Exercise duration (min): 12 min
Peak HR: 164 {beats}/min

## 2015-04-29 ENCOUNTER — Telehealth: Payer: Self-pay | Admitting: *Deleted

## 2015-04-29 NOTE — Telephone Encounter (Signed)
Spoke to patient. Result given . Verbalized understanding  

## 2015-04-29 NOTE — Telephone Encounter (Signed)
-----   Message from Chilton Siiffany [place], MD sent at 04/28/2015  2:57 PM EST ----- Normal stress test

## 2015-05-04 DIAGNOSIS — M65312 Trigger thumb, left thumb: Secondary | ICD-10-CM | POA: Insufficient documentation

## 2015-05-04 DIAGNOSIS — M65319 Trigger thumb, unspecified thumb: Secondary | ICD-10-CM | POA: Insufficient documentation

## 2015-05-06 ENCOUNTER — Ambulatory Visit: Payer: Medicare Other | Admitting: Neurology

## 2015-05-06 ENCOUNTER — Encounter: Payer: Self-pay | Admitting: Infectious Disease

## 2015-05-07 ENCOUNTER — Telehealth: Payer: Self-pay | Admitting: *Deleted

## 2015-05-07 ENCOUNTER — Ambulatory Visit: Payer: Medicare Other | Admitting: Neurology

## 2015-05-07 NOTE — Telephone Encounter (Signed)
no showed f/u appt 

## 2015-05-18 ENCOUNTER — Emergency Department (HOSPITAL_COMMUNITY)
Admission: EM | Admit: 2015-05-18 | Discharge: 2015-05-18 | Discharge status: Absent without leave | Payer: Self-pay | Source: Home / Self Care

## 2015-05-18 ENCOUNTER — Emergency Department (HOSPITAL_COMMUNITY): Payer: Medicare Other

## 2015-05-18 ENCOUNTER — Emergency Department (HOSPITAL_COMMUNITY)
Admission: EM | Admit: 2015-05-18 | Discharge: 2015-05-18 | Disposition: A | Discharge status: Home / Self Care | Payer: Medicare Other | Attending: Emergency Medicine | Admitting: Emergency Medicine

## 2015-05-18 ENCOUNTER — Encounter (HOSPITAL_COMMUNITY): Payer: Self-pay | Admitting: Family Medicine

## 2015-05-18 DIAGNOSIS — Z8701 Personal history of pneumonia (recurrent): Secondary | ICD-10-CM | POA: Diagnosis not present

## 2015-05-18 DIAGNOSIS — G43909 Migraine, unspecified, not intractable, without status migrainosus: Secondary | ICD-10-CM | POA: Insufficient documentation

## 2015-05-18 DIAGNOSIS — Y9289 Other specified places as the place of occurrence of the external cause: Secondary | ICD-10-CM | POA: Diagnosis not present

## 2015-05-18 DIAGNOSIS — Z8719 Personal history of other diseases of the digestive system: Secondary | ICD-10-CM | POA: Insufficient documentation

## 2015-05-18 DIAGNOSIS — S0990XA Unspecified injury of head, initial encounter: Secondary | ICD-10-CM | POA: Insufficient documentation

## 2015-05-18 DIAGNOSIS — Z3202 Encounter for pregnancy test, result negative: Secondary | ICD-10-CM | POA: Insufficient documentation

## 2015-05-18 DIAGNOSIS — Z21 Asymptomatic human immunodeficiency virus [HIV] infection status: Secondary | ICD-10-CM | POA: Insufficient documentation

## 2015-05-18 DIAGNOSIS — W01190A Fall on same level from slipping, tripping and stumbling with subsequent striking against furniture, initial encounter: Secondary | ICD-10-CM | POA: Insufficient documentation

## 2015-05-18 DIAGNOSIS — F17210 Nicotine dependence, cigarettes, uncomplicated: Secondary | ICD-10-CM | POA: Diagnosis not present

## 2015-05-18 DIAGNOSIS — Z7952 Long term (current) use of systemic steroids: Secondary | ICD-10-CM | POA: Diagnosis not present

## 2015-05-18 DIAGNOSIS — F329 Major depressive disorder, single episode, unspecified: Secondary | ICD-10-CM | POA: Diagnosis not present

## 2015-05-18 DIAGNOSIS — Z79899 Other long term (current) drug therapy: Secondary | ICD-10-CM | POA: Insufficient documentation

## 2015-05-18 DIAGNOSIS — I10 Essential (primary) hypertension: Secondary | ICD-10-CM | POA: Insufficient documentation

## 2015-05-18 DIAGNOSIS — Y9389 Activity, other specified: Secondary | ICD-10-CM | POA: Insufficient documentation

## 2015-05-18 DIAGNOSIS — F0781 Postconcussional syndrome: Secondary | ICD-10-CM | POA: Diagnosis not present

## 2015-05-18 DIAGNOSIS — Y998 Other external cause status: Secondary | ICD-10-CM | POA: Diagnosis not present

## 2015-05-18 DIAGNOSIS — F419 Anxiety disorder, unspecified: Secondary | ICD-10-CM | POA: Diagnosis not present

## 2015-05-18 DIAGNOSIS — R569 Unspecified convulsions: Secondary | ICD-10-CM | POA: Diagnosis not present

## 2015-05-18 DIAGNOSIS — Z791 Long term (current) use of non-steroidal anti-inflammatories (NSAID): Secondary | ICD-10-CM | POA: Diagnosis not present

## 2015-05-18 DIAGNOSIS — M1990 Unspecified osteoarthritis, unspecified site: Secondary | ICD-10-CM | POA: Diagnosis not present

## 2015-05-18 DIAGNOSIS — R000 Tachycardia, unspecified: Secondary | ICD-10-CM | POA: Diagnosis not present

## 2015-05-18 LAB — BASIC METABOLIC PANEL
Anion gap: 10 (ref 5–15)
BUN: 6 mg/dL (ref 6–20)
CHLORIDE: 104 mmol/L (ref 101–111)
CO2: 28 mmol/L (ref 22–32)
CREATININE: 0.7 mg/dL (ref 0.44–1.00)
Calcium: 9.7 mg/dL (ref 8.9–10.3)
GFR calc Af Amer: >60 mL/min (ref >60)
GFR calc non Af Amer: >60 mL/min (ref >60)
GLUCOSE: 116 mg/dL — AB (ref 65–99)
POTASSIUM: 4.1 mmol/L (ref 3.5–5.1)
SODIUM: 142 mmol/L (ref 135–145)

## 2015-05-18 LAB — CBC
HEMATOCRIT: 41.4 % (ref 36.0–46.0)
HEMOGLOBIN: 13.7 g/dL (ref 12.0–15.0)
MCH: 31.8 pg (ref 26.0–34.0)
MCHC: 33.1 g/dL (ref 30.0–36.0)
MCV: 96.1 fL (ref 78.0–100.0)
Platelets: 206 10*3/uL (ref 150–400)
RBC: 4.31 MIL/uL (ref 3.87–5.11)
RDW: 12.1 % (ref 11.5–15.5)
WBC: 6.8 10*3/uL (ref 4.0–10.5)

## 2015-05-18 LAB — I-STAT BETA HCG BLOOD, ED (MC, WL, AP ONLY)

## 2015-05-18 MED ORDER — ONDANSETRON HCL 4 MG PO TABS: 4.0000 mg | ORAL_TABLET | Freq: Four times a day (QID) | ORAL | Status: DC | PRN

## 2015-05-18 MED ORDER — ACETAMINOPHEN 325 MG PO TABS: 650.0000 mg | ORAL_TABLET | Freq: Once | ORAL | Status: DC

## 2015-05-18 MED ORDER — ONDANSETRON HCL 4 MG/2ML IJ SOLN
4.0000 mg | Freq: Once | INTRAMUSCULAR | Status: AC
  Administered 2015-05-18: 4 mg via INTRAVENOUS
  Filled 2015-05-18: qty 2

## 2015-05-18 NOTE — ED Provider Notes (Signed)
CSN: [id number]     Arrival date & time 05/18/15  1503 History   First MD Initiated Contact with Patient 05/18/15 1554     Chief Complaint  Patient presents with  . Seizures  . Emesis   34 year old female with history of seizures, depression, headaches, and HIV who presents today with feeling tired and dizzy. She states that this has been present since Saturday since she had a seizure. Did not feel an aura before the seizure. Says it was witnessed by her children and comprised her bleeding status falling off the couch and knocking her head on the coffee table on an floor. This lasted  for a couple minutes before she came to. She was postictal initially and did bleed on herself during the seizure. EMS arrived and recommended she be checked out at the hospital that she denied this at the time. Since that time she had a generalized headache, blurry vision and has not felt well. Has also had mild N/V. She denies any arm or leg weakness, slurred speech, worst headache of her life, fevers, chills, abdominal pain, dysuria, hematuria, flank pain, sick contacts, recent travel.  Patient was on Keppra until recently switched to Lamictal. Has not missed any doses.   Patient is a 34 y.o. female presenting with headaches.  Headache Pain location:  Generalized Radiates to:  Does not radiate Severity currently:  5/10 Onset quality:  Gradual Duration:  3 days Timing:  Constant Progression:  Unchanged Chronicity:  New Context comment:  After hitting head during seizure Relieved by:  Nothing Worsened by:  Nothing Ineffective treatments:  None tried Associated symptoms: dizziness, fatigue, nausea and vomiting   Associated symptoms: no abdominal pain, no diarrhea and no fever     Past Medical History  Diagnosis Date  . HIV infection (HCC) 2016  . Anxiety   . Arthritis 2000    Right wrist/hand, prior fracture  . Chronic headaches 2005    Migraines  . Depression 2001  . Gallstones   . Pneumonia    . Seizures (HCC)   . 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 Sleep apnea    Past Surgical History  Procedure Laterality Date  . Cholecystectomy    . Fracture surgery      2-right hand  . Ureter surgery      Placement and removal   . Rotator cuff repair Right 2000    repair frozen shoulder 2001   Family History  Problem Relation Age of Onset  . Diabetes Maternal Grandfather   . Heart disease Maternal Grandfather   . Seizures Neg Hx   . Breast cancer Mother    Social History  Substance Use Topics  . Smoking status: Current Every Day Smoker -- 0.00 packs/day for 0 years    Types: Cigarettes  . Smokeless tobacco: Never Used  . Alcohol Use: No   OB History    No data available     Review of Systems  Constitutional: Positive for fatigue. Negative for fever and chills.  Respiratory: Negative for shortness of breath.   Cardiovascular: Negative for chest pain, palpitations and leg swelling.  Gastrointestinal: Positive for nausea and vomiting. Negative for abdominal pain, diarrhea, constipation and abdominal distention.  Genitourinary: Negative for dysuria, frequency, flank pain and decreased urine volume.  Neurological: Positive for dizziness and headaches. Negative for speech difficulty and light-headedness.  All other systems reviewed and are negative.     Allergies  Robinul  Home Medications   Prior to Admission medications   Medication Sig Start Date End Date Taking? Authorizing Provider  buprenorphine (SUBUTEX) 8 MG SUBL SL tablet Place 8 mg under the tongue 3 (three) times daily.    Yes Historical Provider, MD  dolutegravir (TIVICAY) 50 MG tablet Take 1 tablet (50 mg total) by mouth daily. 10/08/14  Yes Randall Hissornelius N Van [place], MD  fludrocortisone (FLORINEF) 0.1 MG tablet Take 1 tablet (0.1 mg total) by mouth daily. 03/19/15  Yes Chilton Siiffany [place], MD  lamivudine (EPIVIR) 300 MG tablet Take 1 tablet (300 mg total) by mouth  daily. 10/08/14  Yes Randall Hissornelius N [place], MD  lamoTRIgine (LAMICTAL) 100 MG tablet Take 1 tablet (100 mg total) by mouth daily. 04/08/15  Yes Anson FretAntonia B Ahern, MD  lamoTRIgine (LAMICTAL) 25 MG tablet Take 50 mg by mouth daily.   Yes Historical Provider, MD  pantoprazole (PROTONIX) 40 MG tablet Take 1 tablet (40 mg total) by mouth daily. 12/29/14  Yes Louis Meckelobert D Kaplan, MD  tamsulosin (FLOMAX) 0.4 MG CAPS capsule Take 0.4 mg by mouth daily. 01/22/15  Yes Historical Provider, MD  traZODone (DESYREL) 100 MG tablet Take 200 mg by mouth at bedtime as needed. 02/03/15  Yes Historical Provider, MD  cyclobenzaprine (FLEXERIL) 5 MG tablet Take 1 tablet (5 mg total) by mouth 2 (two) times daily as needed. 04/14/15   Marlon Peliffany Greene, PA-C  Elastic Bandages & Supports (WRIST SPLINT) MISC Dispense one L sided thumb spica splint. Wear daily. 04/14/15   Latrelle DodrillBrittany J McIntyre, MD  levETIRAcetam (KEPPRA) 500 MG tablet Take 500 mg by mouth 2 (two) times daily.  03/06/15   Historical Provider, MD  naproxen (NAPROSYN) 375 MG tablet Take 1 tablet (375 mg total) by mouth 2 (two) times daily. 04/14/15   Tiffany Neva SeatGreene, PA-C  ondansetron (ZOFRAN) 4 MG tablet Take 1 tablet (4 mg total) by mouth every 6 (six) hours as needed for nausea or vomiting. 05/18/15   Rachelle HoraKeri Makhayla Mcmurry, MD   BP 138/94 mmHg  Pulse 67  Temp(Src) 98 F (36.7 C) (Oral)  Resp 15  SpO2 100% Physical Exam  Constitutional: She is oriented to person, place, and time. She appears well-developed and well-nourished. No distress.  HENT:  Head: Normocephalic and atraumatic.  Mouth/Throat: No oropharyngeal exudate.  Eyes: Pupils are equal, round, and reactive to light.  Neck: Normal range of motion.  Cardiovascular: Normal rate, regular rhythm, normal heart sounds and intact distal pulses.  Exam reveals no gallop and no friction rub.   No murmur heard. tachy  Pulmonary/Chest: Effort normal and breath sounds normal. No respiratory distress. She has no wheezes. She has no  rales. She exhibits no tenderness.  Abdominal: Soft. Bowel sounds are normal. She exhibits no distension and no mass. There is no tenderness. There is no rebound and no guarding.  Musculoskeletal: Normal range of motion. She exhibits no edema or tenderness.  Lymphadenopathy:    She has no cervical adenopathy ( ).  Neurological: She is alert and oriented to person, place, and time. No cranial nerve deficit. Coordination normal.  Skin: Skin is warm and dry. No rash noted. She is not diaphoretic.  Nursing note and vitals reviewed.   ED Course  Procedures (including critical care time) Labs Review Labs Reviewed  BASIC METABOLIC PANEL - Abnormal; Notable for the following:    Glucose, Bld 116 (*)    All other components within normal limits  CBC  LAMOTRIGINE LEVEL  CBG MONITORING, ED  I-STAT  BETA HCG BLOOD, ED ([facility], [facility], [facility] ONLY)    Imaging Review Ct Head Wo Contrast  05/18/2015  CLINICAL DATA:  34 year old female with persistent headaches and lethargy following seizure 2 days previously EXAM: CT HEAD WITHOUT CONTRAST TECHNIQUE: Contiguous axial images were obtained from the base of the skull through the vertex without intravenous contrast. COMPARISON:  Most recent prior MRI of the brain 12/10/2014 FINDINGS: Negative for acute intracranial hemorrhage, acute infarction, mass, mass effect, hydrocephalus or midline shift. Gray-white differentiation is preserved throughout. No acute soft tissue or calvarial abnormality. The globes and orbits are symmetric and unremarkable. Normal aeration of the mastoid air cells and visualized paranasal sinuses. IMPRESSION: Negative head CT. Electronically Signed   By: Malachy Moan M.D.   On: 05/18/2015 17:46   I have personally reviewed and evaluated these images and lab results as part of my medical decision-making.   EKG Interpretation None      MDM   Final diagnoses:  Concussion syndrome   34 year old Caucasian female who presents today with  headache after a seizure 3 days ago. Please see history of present illness for details. On exam patient in no acute distress, afebrile, vital stimulus from mild hypertension. No peripheral deficits on neuro exam. Most likely a concussive syndrome given no red flags on exam. Considered but not consistent with meningitis or other CNS infection. Patient is immunocompetent with undetectable viral load and adequate lymphocytes on last check (November).  CT wnl. Labs ok. Pt tolerating PO and ambulating well. Safe for DC home. Will FU w/neurology in 2 days as scheduled.   Pt was seen under the supervision of Dr. Manus Gunning.     Rachelle Hora, MD 05/18/15 1956  Glynn Octave, MD 05/19/15 [phone]

## 2015-05-18 NOTE — ED Notes (Signed)
Pt ambulated down hallway with a steady gait and no dizziness or nausea. MD made aware.

## 2015-05-18 NOTE — ED Notes (Addendum)
Lab called to verify they received blood for Lamotrigine lab. States they do have blood and will be ran now.

## 2015-05-18 NOTE — Discharge Instructions (Signed)
Post-Concussion Syndrome Post-concussion syndrome is the symptoms that can occur after a head injury. These symptoms can last from weeks to months. HOME CARE   Take medicines only as told by your doctor.  Do not take aspirin.  Sleep with your head raised to help with headaches.  Avoid activities that can cause another head injury.  Do not play contact sports like football, hockey, soccer, or basketball.  Do not do other risky activities like downhill skiing, martial arts, or horseback riding until your doctor says it is okay.  Keep all follow-up visits as told by your doctor. This is important.   GET HELP IF:   You have a harder time:  Paying attention.  Focusing.  Remembering.  Learning new information.  Dealing with stress.  You need more time to complete tasks.  You are easily bothered (irritable).  You have more symptoms.   Get help if you have any of these symptoms for more than two weeks after your injury:   Long-lasting (chronic) headaches.  Dizziness.  Trouble balancing.  Feeling sick to your stomach (nauseous).  Trouble with your vision.  Noise or light bothers you more.  Depression.  Mood swings.  Feeling worried (anxious).  Easily bothered.  Memory problems.  Trouble concentrating or paying attention.  Sleep problems.  Feeling tired all of the time.   GET HELP RIGHT AWAY IF:  You feel confused.  You feel very sleepy.  You are hard to wake up.  You feel sick to your stomach.  You keep throwing up (vomiting).  You feel like you are moving when you are not (vertigo).  Your eyes move back and forth very quickly.  You start shaking (convulsing) or pass out (faint).  You have very bad headaches that do not get better with medicine.  You cannot use your arms or legs like normal.  One of the black centers of your eyes (pupils) is bigger than the other.  You have clear or bloody fluid coming from your nose or ears.  Your  problems get worse, not better. MAKE SURE YOU:  Understand these instructions.  Will watch your condition.  Will get help right away if you are not doing well or get worse.   This information is not intended to replace advice given to you by your health care provider. Make sure you discuss any questions you have with your health care provider.   Document Released: 06/23/2004 Document Revised: 06/06/2014 Document Reviewed: 08/21/2013 Elsevier Interactive Patient Education 2016 [employer] Inc[employer]  

## 2015-05-18 NOTE — ED Notes (Signed)
Pt given Malaw[place]urkey sand which and soda for PO challenge.

## 2015-05-18 NOTE — ED Notes (Signed)
MD at bedside. 

## 2015-05-18 NOTE — ED Notes (Signed)
Pt here for seizure saturday. sts she was incontinent and fell and hit her head. sts hematoma to head and she has been vomiting since even in her sleep. Hx of seizures but not normally like this afterwards.

## 2015-05-19 LAB — LAMOTRIGINE LEVEL: Lamotrigine Lvl: 2.7 ug/mL (ref 2.0–20.0)

## 2015-05-20 ENCOUNTER — Ambulatory Visit (INDEPENDENT_AMBULATORY_CARE_PROVIDER_SITE_OTHER): Payer: Medicare Other | Admitting: Neurology

## 2015-05-20 VITALS — BP 125/79 | HR 92 | Ht 66.0 in | Wt 114.6 lb

## 2015-05-20 DIAGNOSIS — R569 Unspecified convulsions: Secondary | ICD-10-CM | POA: Diagnosis not present

## 2015-05-20 DIAGNOSIS — R110 Nausea: Secondary | ICD-10-CM

## 2015-05-20 MED ORDER — LAMOTRIGINE 200 MG PO TABS: 200.0000 mg | ORAL_TABLET | Freq: Two times a day (BID) | ORAL | Status: DC

## 2015-05-20 MED ORDER — ONDANSETRON 4 MG PO TBDP: 4.0000 mg | ORAL_TABLET | Freq: Three times a day (TID) | ORAL | Status: DC | PRN

## 2015-05-20 NOTE — Patient Instructions (Signed)
Overall you are doing fairly well but I do want to suggest a few things today:   Remember to drink plenty of fluid, eat healthy meals and do not skip any meals. Try to eat protein with a every meal and eat a healthy snack such as fruit or nuts in between meals. Try to keep a regular sleep-wake schedule and try to exercise daily, particularly in the form of walking, 20-30 minutes a day, if you can.   As far as your medications are concerned, I would like to suggest:  One week: Take 200mg  in the evening and 150 at night Then change to 200mg  twice daily  I would like to see you back in 3 months, sooner if we need to. Please call us with any interim questions, concerns, problems, updates or refill requests.   Our phone number is [phone]. We also have an after hours call service for urgent matters and there is a physician on-call for urgent questions. For any emergencies you know to call 911 or go to the nearest emergency room
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
## 2015-06-04 ENCOUNTER — Emergency Department (HOSPITAL_COMMUNITY)
Admission: EM | Admit: 2015-06-04 | Discharge: 2015-06-04 | Disposition: A | Discharge status: Home / Self Care | Payer: Medicare Other | Attending: Emergency Medicine | Admitting: Emergency Medicine

## 2015-06-04 ENCOUNTER — Encounter (HOSPITAL_COMMUNITY): Payer: Self-pay | Admitting: *Deleted

## 2015-06-04 DIAGNOSIS — F17210 Nicotine dependence, cigarettes, uncomplicated: Secondary | ICD-10-CM | POA: Diagnosis not present

## 2015-06-04 DIAGNOSIS — Z79899 Other long term (current) drug therapy: Secondary | ICD-10-CM | POA: Diagnosis not present

## 2015-06-04 DIAGNOSIS — F419 Anxiety disorder, unspecified: Secondary | ICD-10-CM | POA: Diagnosis not present

## 2015-06-04 DIAGNOSIS — F329 Major depressive disorder, single episode, unspecified: Secondary | ICD-10-CM | POA: Insufficient documentation

## 2015-06-04 DIAGNOSIS — G8929 Other chronic pain: Secondary | ICD-10-CM | POA: Diagnosis not present

## 2015-06-04 DIAGNOSIS — H6591 Unspecified nonsuppurative otitis media, right ear: Secondary | ICD-10-CM | POA: Diagnosis not present

## 2015-06-04 DIAGNOSIS — Z8701 Personal history of pneumonia (recurrent): Secondary | ICD-10-CM | POA: Diagnosis not present

## 2015-06-04 DIAGNOSIS — Z791 Long term (current) use of non-steroidal anti-inflammatories (NSAID): Secondary | ICD-10-CM | POA: Insufficient documentation

## 2015-06-04 DIAGNOSIS — M1990 Unspecified osteoarthritis, unspecified site: Secondary | ICD-10-CM | POA: Insufficient documentation

## 2015-06-04 DIAGNOSIS — G43909 Migraine, unspecified, not intractable, without status migrainosus: Secondary | ICD-10-CM | POA: Diagnosis not present

## 2015-06-04 DIAGNOSIS — H9201 Otalgia, right ear: Secondary | ICD-10-CM | POA: Diagnosis present

## 2015-06-04 DIAGNOSIS — Z21 Asymptomatic human immunodeficiency virus [HIV] infection status: Secondary | ICD-10-CM | POA: Diagnosis not present

## 2015-06-04 DIAGNOSIS — Z7952 Long term (current) use of systemic steroids: Secondary | ICD-10-CM | POA: Insufficient documentation

## 2015-06-04 DIAGNOSIS — Z8719 Personal history of other diseases of the digestive system: Secondary | ICD-10-CM | POA: Diagnosis not present

## 2015-06-04 MED ORDER — AMOXICILLIN 500 MG PO CAPS: 500.0000 mg | ORAL_CAPSULE | Freq: Three times a day (TID) | ORAL | Status: DC

## 2015-06-04 NOTE — ED Notes (Signed)
Pt c/o worsening ear pain. Unsure of when it started but states it got much worse today, cannot hear out of ear.

## 2015-06-04 NOTE — Discharge Instructions (Signed)

## 2015-06-04 NOTE — ED Provider Notes (Signed)
CSN: [id number]     Arrival date & time 06/04/15  2037 History  By signing my name below, I, Emmanuella Mensah, attest that this documentation has been prepared under the direction and in the presence of Cheron SchaumannLeslie Sofia, PA-C. Electronically Signed: Angelene GiovanniEmmanuella Mensah, ED Scribe. 06/04/2015. 9:37 PM.    Chief Complaint  Patient presents with  . Otitis Media   The history is provided by the patient. No language interpreter was used.   HPI Comments: [name] is a 35 y.o. female with a hx of HIV, chronic headaches, and seizures who presents to the Emergency Department complaining of gradually worsening right ear pain that radiates down towards her right jaw onset several days ago. She reports associated feeling of having something in ear. No alleviating factors noted. She denies any fever, chills, ear discharge, or n/v.    Past Medical History  Diagnosis Date  . HIV infection (HCC) 2016  . Anxiety   . Arthritis 2000    Right wrist/hand, prior fracture  . Chronic headaches 2005    Migraines  . Depression 2001  . Gallstones   . Pneumonia   . Seizures (HCC)   . 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 Sleep apnea    Past Surgical History  Procedure Laterality Date  . Cholecystectomy    . Fracture surgery      2-right hand  . Ureter surgery      Placement and removal   . Rotator cuff repair Right 2000    repair frozen shoulder 2001   Family History  Problem Relation Age of Onset  . Diabetes Maternal Grandfather   . Heart disease Maternal Grandfather   . Seizures Neg Hx   . Breast cancer Mother    Social History  Substance Use Topics  . Smoking status: Current Every Day Smoker -- 0.00 packs/day for 0 years    Types: Cigarettes  . Smokeless tobacco: Never Used  . Alcohol Use: No   OB History    No data available     Review of Systems  Constitutional: Negative for fever and chills.  HENT: Positive for ear pain. Negative for  ear discharge.   Musculoskeletal: Positive for arthralgias.  All other systems reviewed and are negative.     Allergies  Robinul  Home Medications   Prior to Admission medications   Medication Sig Start Date End Date Taking? Authorizing Provider  buprenorphine (SUBUTEX) 8 MG SUBL SL tablet Place 8 mg under the tongue 3 (three) times daily.     Historical Provider, MD  dolutegravir (TIVICAY) 50 MG tablet Take 1 tablet (50 mg total) by mouth daily. 10/08/14   Randall Hissornelius N Van Dam, MD  fludrocortisone (FLORINEF) 0.1 MG tablet Take 1 tablet (0.1 mg total) by mouth daily. 03/19/15   Chilton Siiffany Amity, MD  lamivudine (EPIVIR) 300 MG tablet Take 1 tablet (300 mg total) by mouth daily. 10/08/14   Randall Hissornelius N Van Dam, MD  lamoTRIgine (LAMICTAL) 200 MG tablet Take 1 tablet (200 mg total) by mouth 2 (two) times daily. 05/20/15   Anson FretAntonia B Ahern, MD  naproxen (NAPROSYN) 375 MG tablet Take 1 tablet (375 mg total) by mouth 2 (two) times daily. 04/14/15   Tiffany Neva SeatGreene, PA-C  ondansetron (ZOFRAN-ODT) 4 MG disintegrating tablet Take 1 tablet (4 mg total) by mouth every 8 (eight) hours as needed for nausea. 05/20/15   Anson FretAntonia B Ahern, MD  tamsulosin (FLOMAX) 0.4 MG  CAPS capsule Take 0.4 mg by mouth daily. 01/22/15   Historical Provider, MD  traZODone (DESYREL) 100 MG tablet Take 200 mg by mouth at bedtime as needed. 02/03/15   Historical Provider, MD   BP 154/91 mmHg  Pulse 84  Temp(Src) 98.3 F (36.8 C) (Oral)  Resp 18  SpO2 98% Physical Exam  Constitutional: She is oriented to person, place, and time. She appears well-developed and well-nourished. No distress.  HENT:  Head: Normocephalic and atraumatic.  Right TM was erythematous compared to her left Mouth and gums: no lesions, no swelling No lymphadenopathy   Eyes: Conjunctivae and EOM are normal.  Neck: Neck supple. No tracheal deviation present.  Cardiovascular: Normal rate.   Pulmonary/Chest: Effort normal. No respiratory distress.   Musculoskeletal: Normal range of motion.  Neurological: She is alert and oriented to person, place, and time.  Skin: Skin is warm and dry.  Psychiatric: She has a normal mood and affect. Her behavior is normal.  Nursing note and vitals reviewed.   ED Course  Procedures (including critical care time) DIAGNOSTIC STUDIES: Oxygen Saturation is 98% on RA, normal by my interpretation.    COORDINATION OF CARE: 9:35 PM- Pt advised of plan for treatment and pt agrees. Pt will be placed on Antibiotics. Follow up with PCP in a few days. Advised to return in she develops a fever.    MDM   Final diagnoses:  Right non-suppurative otitis media, recurrence not specified   amoxicilian An After Visit Summary was printed and given to the patient.  I personally performed the services in this documentation, which was scribed in my presence.  The recorded information has been reviewed and considered.   Barnet Pall.  Lonia Skinner [place], PA-C 06/05/15 0030  Laurence Spates, MD 06/05/15 [phone]

## 2015-06-17 ENCOUNTER — Ambulatory Visit (INDEPENDENT_AMBULATORY_CARE_PROVIDER_SITE_OTHER): Payer: Medicare Other | Admitting: Family Medicine

## 2015-06-17 ENCOUNTER — Encounter: Payer: Self-pay | Admitting: Family Medicine

## 2015-06-17 VITALS — BP 142/85 | HR 76 | Temp 97.9°F | Ht 66.0 in | Wt 116.0 lb

## 2015-06-17 DIAGNOSIS — R1010 Upper abdominal pain, unspecified: Secondary | ICD-10-CM

## 2015-06-17 DIAGNOSIS — R1012 Left upper quadrant pain: Principal | ICD-10-CM

## 2015-06-17 DIAGNOSIS — R1011 Right upper quadrant pain: Secondary | ICD-10-CM

## 2015-06-17 DIAGNOSIS — N921 Excessive and frequent menstruation with irregular cycle: Secondary | ICD-10-CM | POA: Diagnosis not present

## 2015-06-17 LAB — POCT HEMOGLOBIN: Hemoglobin: 12.6 g/dL (ref 12.2–16.2)

## 2015-06-17 LAB — POCT H PYLORI SCREEN: H PYLORI SCREEN, POC: NEGATIVE

## 2015-06-17 MED ORDER — RANITIDINE HCL 150 MG PO TABS: 150.0000 mg | ORAL_TABLET | Freq: Two times a day (BID) | ORAL | Status: DC

## 2015-06-17 MED ORDER — MEDROXYPROGESTERONE ACETATE 10 MG PO TABS: 10.0000 mg | ORAL_TABLET | Freq: Every day | ORAL | Status: DC

## 2015-06-17 NOTE — Assessment & Plan Note (Addendum)
Persistent gradual worsening upper abdomen pain now Left to Right upper quads. Unclear etiology, has had extensive work-up with Abd CT, GI referral and EGD 12/2014, some chronic inflammation, has not resolved with PPI trials. - s/p cholecystectomy - Differential includes: strong suspicion for component of somatization disorder with abdominal complaints, pelvic, and neurologic with seizures in psych patient. Consider may still be inflammatory / PUD  Plan: 1. Check POC H pylori - NEGATIVE, reassurance that no prior infection as well 2. Start Zantac  BID x 2-4 week trial, then consider inc to higher dose PPI with Protonix 40 BID for 2-4 weeks 3. RTC 1 month to follow-up, unlikely for further imaging to provide cause. Can return to GI, and likely follow-up with Psychiatry (Dr Margarita Rana, [facility])

## 2015-06-17 NOTE — Progress Notes (Signed)
Subjective:    Patient ID: [name], female    DOB: [birth year], 35 y.o.   MRN: [id number]  [name] is a 35 y.o. female presenting on 06/17/2015 for Abdominal Pain and Menorrhagia  Patient presents for a same day appointment.  HPI   ABDOMINAL PAIN, UPPER, CHRONIC: - Known chronic upper abdominal pain about 1 year. Started in Spring 2016 initially LUQ under left lower ribs, intermittent, gradual worsening to extend now to RUQ across upper abdomen. Has had work-up with ED visits and abdomen/pelvis CT 09/2014 without acute abnormalities. Established [facility] last visit 12/2014 had EGD showed some irregularity of GE junction with slightly inflamed mucosa, biopsies normal with some chronic inflammation, no malignancy or h pylori. Treated with PPI for weeks without significant relief, trial on hyomax. - Today describes gradual worsening of this chronic pain, across upper abdomen, feels like it's her "diaphragm" but no difficulty breathing. Pain is now mostly constant aching 5/10, intermittently worsens to sharp 9/10 pain. Associated with occasional nausea / vomiting only with severe pain. Otherwise tolerating PO well. - S/p Cholecystectomy - Admits to occasional chills, some night sweats - Denies any fevers, lower abdominal pain, diarrhea, constipation, rectal bleeding  MENORRHAGIA, ABNORMAL UTERINE BLEEDING: - Prior menstrual history with regular periods monthly until past 1-2 years then had period of amenorrhea until December 2016 then has had episodic heavy bleeding for 2-3 days on then stopped. Last bleeding was 2 days ago. Describes clots and large bleeding with tampon lasting 45 min per - Not currently sexually active - Denies fatigue, weakness, CP, SOB, pre-syncope, vaginal discharge  Social History   Social History  . Marital Status: Legally Separated    Spouse Name: Jonny Ruiz  . Number of Children: 2  . Years of Education: 12+   Occupational History  . Unemployed     Social History Main Topics  . Smoking status: Current Every Day Smoker -- 0.00 packs/day for 0 years    Types: Cigarettes  . Smokeless tobacco: Never Used  . Alcohol Use: No  . Drug Use: 7.00 per week    Special: Marijuana, Heroin  . Sexual Activity: Not on file     Comment: declined condoms   Other Topics Concern  . Not on file   Social History Narrative   Lives at home with kids   Caffeine use: Drinks coffee (1 cup per day)    Review of Systems Per HPI unless specifically indicated above     Objective:    BP 142/85 mmHg  Pulse 76  Temp(Src) 97.9 F (36.6 C) (Oral)  Ht  (1.676 m)  Wt 116 lb (52.617 kg)  BMI 18.73 kg/m2  LMP 06/15/2015  Wt Readings from Last 3 Encounters:  06/17/15 116 lb (52.617 kg)  05/20/15 114 lb 9.6 oz (51.982 kg)  04/21/15 116 lb (52.617 kg)    Physical Exam  Constitutional: She is oriented to person, place, and time. She appears well-developed and well-nourished. No distress.  Appears older than stated age, currently well and cooperative  HENT:  Mouth/Throat: Oropharynx is clear and moist.  Neck: Normal range of motion. Neck supple.  Cardiovascular: Normal rate, regular rhythm, normal heart sounds and intact distal pulses.   No murmur heard. Pulmonary/Chest: Effort normal and breath sounds normal. No respiratory distress. She has no wheezes. She has no rales.  Abdominal: Soft. Bowel sounds are normal. She exhibits no distension and no mass. There is no tenderness. There is no rebound and no guarding.  Mild to moderate tenderness to palpation bilateral upper quadrants, L>R, negative Murphy's (s/p choley), negative McBurneys, and lower abdomen non-tender. Soft, non-distended.  Genitourinary:  Deferred pelvic exam. Last normal 02/2015. Patient denies active bleeding.  Lymphadenopathy:    She has no cervical adenopathy.  Neurological: She is alert and oriented to person, place, and time.  Skin: Skin is warm and dry. She is not  diaphoretic.  Psychiatric: She has a normal mood and affect. Her behavior is normal. Thought content normal.  Nursing note and vitals reviewed.  Results for orders placed or performed in visit on 06/17/15  H.pylori screen, POC  Result Value Ref Range   H Pylori Screen, POC NEG   POCT hemoglobin  Result Value Ref Range   Hemoglobin 12.6 12.2 - 16.2 g/dL      Assessment & Plan:   Problem List Items Addressed This Visit    Abdominal pain, bilateral upper quadrant - Primary    Persistent gradual worsening upper abdomen pain now Left to Right upper quads. Unclear etiology, has had extensive work-up with Abd CT, GI referral and EGD 12/2014, some chronic inflammation, has not resolved with PPI trials. - s/p cholecystectomy - Differential includes: strong suspicion for component of somatization disorder with abdominal complaints, pelvic, and neurologic with seizures in psych patient. Consider may still be inflammatory / PUD  Plan: 1. Check POC H pylori - NEGATIVE, reassurance that no prior infection as well 2. Start Zantac  BID x 2-4 week trial, then consider inc to higher dose PPI with Protonix 40 BID for 2-4 weeks 3. RTC 1 month to follow-up, unlikely for further imaging to provide cause. Can return to GI, and likely follow-up with Psychiatry (Dr Margarita Rana, [facility])      Relevant Medications   ranitidine (ZANTAC) 150 MG tablet   Other Relevant Orders   H.pylori screen, POC (Completed)   Menorrhagia with irregular cycle    Chronic irregular cycle over past 1-2 years with amenorrhea, now menorrhagia. Actively without bleeding. - Last pelvic with pap smear 02/2015, unremarkable exam  Plan: 1. Check POC Hgb - 12.6, stable, no acute blood loss or signs of anemia 2. Start trial Provera  x 5 days to reset menstrual cycle 3. Can repeat course if needed 4. RTC 1 mo, can refer to GYN if needed      Relevant Medications   medroxyPROGESTERone (PROVERA) 10 MG tablet   Other  Relevant Orders   POCT hemoglobin (Completed)      Meds ordered this encounter  Medications  . medroxyPROGESTERone (PROVERA) 10 MG tablet    Sig: Take 1 tablet (10 mg total) by mouth daily.    Dispense:  5 tablet    Refill:  0  . ranitidine (ZANTAC) 150 MG tablet    Sig: Take 1 tablet (150 mg total) by mouth 2 (two) times daily.    Dispense:  60 tablet    Refill:  0      Follow up plan: Return in about 4 weeks (around 07/15/2015) for upper abdominal pain and vaginal bleeding.  [name], DO [facility], PGY-3

## 2015-06-17 NOTE — Patient Instructions (Signed)
Thank you for coming in to clinic today.  1. For your Heavy Bleeding, we need to reset your menstrual cycle. Start with taking Provera  daily for next 5 days. This should allow your lining to completely finish it's cycle and hopefully it will reset your cycle. You may still have irregular bleeding, we typically wait about 1 month for this to resolve. - Checking Hemoglobin, if this is significantly low we will notify you, and would start some iron tablets - If bleeding significantly worsens or changes, then we can try a repeat course, otherwise will discuss referral to GYN  2. Your upper abdominal pain - I do not know the exact cause, unfortunately as we discussed. However, it still sounds most consistent with Peptic Ulcer Disease to me - Check H Pylori blood test, if this is positive will treat with antibiotics and anti-acid meds, as this can cause lingering symptoms and ulcers in some people - Start Zantac  twice daily for next 2 to 4 weeks, if not helping by 2 weeks, please call back and we will change rx to the Protonix but higher dose twice a day   Please schedule a follow-up appointment with Dr Althea Charon in 1 month for Upper Abdominal Pain and Vaginal Bleeding  If you have any other questions or concerns, please feel free to call the clinic to contact me. You may also schedule an earlier appointment if necessary.  However, if your symptoms get significantly worse, please go to the Emergency Department to seek immediate medical attention.  [name], DO [facility]

## 2015-06-17 NOTE — Assessment & Plan Note (Signed)
Chronic irregular cycle over past 1-2 years with amenorrhea, now menorrhagia. Actively without bleeding. - Last pelvic with pap smear 02/2015, unremarkable exam  Plan: 1. Check POC Hgb - 12.6, stable, no acute blood loss or signs of anemia 2. Start trial Provera  x 5 days to reset menstrual cycle 3. Can repeat course if needed 4. RTC 1 mo, can refer to GYN if needed

## 2015-06-19 ENCOUNTER — Encounter: Payer: Self-pay | Admitting: *Deleted

## 2015-06-19 VITALS — BP 161/95 | HR 69 | Temp 98.1°F | Resp 18 | Wt 116.5 lb

## 2015-06-19 DIAGNOSIS — N921 Excessive and frequent menstruation with irregular cycle: Secondary | ICD-10-CM

## 2015-06-19 DIAGNOSIS — Z006 Encounter for examination for normal comparison and control in clinical research program: Secondary | ICD-10-CM

## 2015-06-19 LAB — COMPREHENSIVE METABOLIC PANEL
ALK PHOS: 73 U/L (ref 33–115)
ALT: 8 U/L (ref 6–29)
AST: 13 U/L (ref 10–30)
Albumin: 4.3 g/dL (ref 3.6–5.1)
BILIRUBIN TOTAL: 0.2 mg/dL (ref 0.2–1.2)
BUN: 6 mg/dL — AB (ref 7–25)
CO2: 25 mmol/L (ref 20–31)
CREATININE: 0.67 mg/dL (ref 0.50–1.10)
Calcium: 8.9 mg/dL (ref 8.6–10.2)
Chloride: 104 mmol/L (ref 98–110)
Glucose, Bld: 91 mg/dL (ref 65–99)
Potassium: 4 mmol/L (ref 3.5–5.3)
Sodium: 139 mmol/L (ref 135–146)
Total Protein: 6.6 g/dL (ref 6.1–8.1)

## 2015-06-19 LAB — BILIRUBIN, DIRECT: BILIRUBIN DIRECT: 0.1 mg/dL (ref <=0.2)

## 2015-06-23 NOTE — Progress Notes (Addendum)
[name] is here for A5353, week 40. We reviewed her ED and MD visits since last research visit and her signs/symptoms and medications. She did bring in her study medication but pharmacy was not available to fill her prescription. She had #1 pills left of each and I gave this back to her so she would only miss Sunday's dose. She received $50 gift card for visit and next appointment scheduled in March. Tacey Heap RN   06/22/2015: Victorino Dike received her study medication today. She has missed one day (06/21/15) of study medication. Tacey Heap RN

## 2015-06-24 ENCOUNTER — Ambulatory Visit (INDEPENDENT_AMBULATORY_CARE_PROVIDER_SITE_OTHER): Payer: Medicare Other | Admitting: Cardiovascular Disease

## 2015-06-24 ENCOUNTER — Encounter: Payer: Self-pay | Admitting: Cardiovascular Disease

## 2015-06-24 VITALS — BP 134/76 | HR 77 | Ht 67.0 in | Wt 117.0 lb

## 2015-06-24 DIAGNOSIS — I491 Atrial premature depolarization: Secondary | ICD-10-CM | POA: Diagnosis not present

## 2015-06-24 DIAGNOSIS — I951 Orthostatic hypotension: Secondary | ICD-10-CM

## 2015-06-24 DIAGNOSIS — R072 Precordial pain: Secondary | ICD-10-CM

## 2015-06-24 NOTE — Progress Notes (Signed)
Cardiology Office Note   Date:  06/24/2015   ID:  [name], DOB 1981/04/14, MRN [id number]  PCP:  [name], DO  Cardiologist:   Madilyn Hook, MD   Chief Complaint  Patient presents with  . 3 month visit    no chest pain ,no sob, no dizziness,no syncope     Patient ID: [name] is a 35 y.o. female with HIV, schizoaffective disorder, seizures and ongoing tobacco abuse who presents for follow up on palpitations.    Interval history 06/24/15:  After her last appointment [name] had a 48 hour Holter that revealed occasional PACs.  She also had a normal treadmill stress test.  She was started on florinef for orthostatic hypotension.  Since then she has been feeling well.  She has not noticed as many palpitations and is no longer orthostatic when standing up quickly or after bending over.  Her only complaint is abdominal pain in her left upper quadrant that radiates across her stomach.  She has been working with her gastroenterologist on this problem.  She denies any chest pain, shortness of breath, lower extremity edema or PND.  History of Present Illness 03/19/15: [name] reports multiple episodes of feeling like her heart swells in her chest. After that she gets hot and sweaty and feels horrible. These episodes occur almost daily and associated with shortness of breath. She feels as though she can't catch her breath. It's associated with nausea, vomiting and diaphoresis that feels like cold sweats. Each episode lasts for approximately 10 minutes. She has to sit down in order to avoid passing out. She has not had any syncope. Sometimes this is also associated with sharp left-sided chest pain that feels like it's around her diaphragm. It occurs both at with exertion and at rest.  She states that she can feel her heartbeat in her eyeballs and can see her shirt moving with every heartbeat.  She reports that her blood pressure is always slow and has been as  low as 60/30 when she's been in the emergency department for monitoring. She gets lightheaded with positional changes but denies syncope because she only sits down before this happens.  [name] was also diagnosed with seizures one year ago. She has no prodromal warning but says that people around her noticed that she has a weird look on her face before she goes into a tonic-clonic seizure. She has no remembrance of these events and has a prolonged postictal period. She's had an EEG that showed epileptiform activity but no seizures.  [name] has been followed by gastroenterologist because she feels like food gets stuck when she tries to swallow and ends up regurgitating it. She's had a negative celiac panel and esophagram showed a small hiatal hernia. There was a question of a mild fold irregularity in the duodenum. She drinks a lot of water up to 4, 32 ounce bottle water bottles daily. However she is replacing many meals with either Jell-O were Carnation.  Yesterday she was able to use 2 pieces of chicken and a small amount of rice.  Her gastroenterologist felt as though her symptoms were nonspecific and unlikely to be due to an underlying GI disorder. He did prescribe a trial of hyomax.   Past Medical History  Diagnosis Date  . HIV infection (HCC) 2016  . Anxiety   . Arthritis 2000    Right wrist/hand, prior fracture  . Chronic headaches 2005    Migraines  .  Depression 2001  . Gallstones   . Pneumonia   . Seizures (HCC)   . 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 Sleep apnea   . PAC (premature atrial contraction) 06/24/2015    Past Surgical History  Procedure Laterality Date  . Cholecystectomy    . Fracture surgery      2-right hand  . Ureter surgery      Placement and removal   . Rotator cuff repair Right 2000    repair frozen shoulder 2001     Current Outpatient Prescriptions  Medication Sig Dispense Refill  . buprenorphine  (SUBUTEX) 8 MG SUBL SL tablet Place 8 mg under the tongue 3 (three) times daily.     . dolutegravir (TIVICAY) 50 MG tablet Take 1 tablet (50 mg total) by mouth daily. 30 tablet 11  . fludrocortisone (FLORINEF) 0.1 MG tablet Take 1 tablet (0.1 mg total) by mouth daily. 30 tablet 6  . lamivudine (EPIVIR) 300 MG tablet Take 1 tablet (300 mg total) by mouth daily. 30 tablet 11  . lamoTRIgine (LAMICTAL) 200 MG tablet Take 1 tablet (200 mg total) by mouth 2 (two) times daily. 60 tablet 11  . medroxyPROGESTERone (PROVERA) 10 MG tablet Take 1 tablet (10 mg total) by mouth daily. 5 tablet 0  . naproxen (NAPROSYN) 375 MG tablet Take 1 tablet (375 mg total) by mouth 2 (two) times daily. 20 tablet 0  . ondansetron (ZOFRAN-ODT) 4 MG disintegrating tablet Take 1 tablet (4 mg total) by mouth every 8 (eight) hours as needed for nausea. 60 tablet 6  . ranitidine (ZANTAC) 150 MG tablet Take 1 tablet (150 mg total) by mouth 2 (two) times daily. 60 tablet 0  . tamsulosin (FLOMAX) 0.4 MG CAPS capsule Take 0.4 mg by mouth daily.    . traZODone (DESYREL) 100 MG tablet Take 200 mg by mouth at bedtime as needed.     No current facility-administered medications for this visit.    Allergies:   Robinul    Social History:  The patient  reports that she has been smoking Cigarettes.  She has been smoking about 0.00 packs per day for the past 0 years. She has never used smokeless tobacco. She reports that she uses illicit drugs (Marijuana and Heroin) about 7 times per week. She reports that she does not drink alcohol.   Family History:  The patient's family history includes Breast cancer in her mother; Diabetes in her maternal grandfather; Heart disease in her maternal grandfather. There is no history of Seizures.    ROS:  Please see the history of present illness.  Otherwise, review of systems are positive for insomnia.   All other systems are reviewed and negative.    PHYSICAL EXAM: VS:  BP 134/76 mmHg  Pulse 77  Ht   (1.702 m)  Wt 53.071 kg (117 lb)  BMI 18.32 kg/m2  LMP 06/15/2015 , BMI Body mass index is 18.32 kg/(m^2). GENERAL:  Well appearing HEENT:  Pupils equal round and reactive, fundi not visualized, oral mucosa unremarkable NECK:  No jugular venous distention, waveform within normal limits, carotid upstroke brisk and symmetric, no bruits  LYMPHATICS:  No cervical adenopathy LUNGS:  Clear to auscultation bilaterally HEART:  RRR.  PMI not displaced or sustained,S1 and S2 within normal limits, no S3, no S4, no clicks, no rubs, no murmurs ABD:  Flat, positive bowel sounds normal in frequency in pitch, no bruits, no rebound,  no guarding, + midline pulsatility, no hepatomegaly, no splenomegaly EXT:  2 plus pulses throughout, no edema, no cyanosis no clubbing SKIN:  No rashes no nodules NEURO:  Cranial nerves II through XII grossly intact, motor grossly intact throughout PSYCH:  Cognitively intact, oriented to person place and time   EKG:  EKG is not ordered today.  48 Hour Holter Monitor 03/26/15:  Quality: Fair. Baseline artifact. Predominant rhythm: sinus rhythm Average heart rate: 72 bpm Max heart rate: 131 bpm Min heart rate: 45 bpm Pauses >2.5 seconds: 0 4 PVCs 58 PACs Supraventricular ectopics:58 Patient did submit a symptom diary. She reported chest pain and dizziness at which time sinus rhythm was noted.  Treadmill stress test 04/28/15:   Response to Stress There was no ST segment deviation noted during stress.  Arrhythmias during stress: rare PVCs.  Arrhythmias during recovery: none.  Arrhythmias were not significant.  ECG was interpretable and there was no significant change from baseline.      Recent Labs: 12/24/2014: Magnesium 2.0; TSH 1.590 05/18/2015: Platelets 206 06/17/2015: Hemoglobin 12.6 06/19/2015: ALT 8; BUN 6*; Creat 0.67; Potassium 4.0; Sodium 139    Lipid Panel    Component Value Date/Time   CHOL 187 09/16/2014 1032   TRIG 133 09/16/2014 1032     HDL 30* 09/16/2014 1032   CHOLHDL 6.2 09/16/2014 1032   VLDL 27 09/16/2014 1032   LDLCALC 130* 09/16/2014 1032      Wt Readings from Last 3 Encounters:  06/24/15 53.071 kg (117 lb)  06/19/15 52.844 kg (116 lb 8 oz)  06/17/15 52.617 kg (116 lb)      ASSESSMENT AND PLAN:  # Orthostatic hypotension:  Resolved.  Continue florinef and increased hydration.  Electrolytes were within normal limits 06/19/15.  # Chest pain: Resolved and stress test was negative for ischemia.  # PACs: Asymptomatic.  Current medicines are reviewed at length with the patient today.  The patient does not have concerns regarding medicines.  The following changes have been made: None  Labs/ tests ordered today include:   No orders of the defined types were placed in this encounter.     Disposition:   FU with Eulogio Requena C. Duke Salvia, MD in 1 year    Signed, Madilyn Hook, MD  06/24/2015 9:46 PM    [facility]

## 2015-06-24 NOTE — Patient Instructions (Signed)
No changes with medications  Your physician wants you to follow-up in 12 months with Dr Duke Salvia. You will receive a reminder letter in the mail two months in advance. If you don't receive a letter, please call our office to schedule the follow-up appointment.  If you need a refill on your cardiac medications before your next appointment, please call your pharmacy.

## 2015-07-01 ENCOUNTER — Encounter (HOSPITAL_COMMUNITY): Payer: Self-pay | Admitting: Emergency Medicine

## 2015-07-01 ENCOUNTER — Emergency Department (HOSPITAL_COMMUNITY)
Admission: EM | Admit: 2015-07-01 | Discharge: 2015-07-01 | Disposition: A | Discharge status: Home / Self Care | Payer: Medicare Other | Attending: Emergency Medicine | Admitting: Emergency Medicine

## 2015-07-01 DIAGNOSIS — F329 Major depressive disorder, single episode, unspecified: Secondary | ICD-10-CM | POA: Diagnosis not present

## 2015-07-01 DIAGNOSIS — R109 Unspecified abdominal pain: Secondary | ICD-10-CM | POA: Insufficient documentation

## 2015-07-01 DIAGNOSIS — R112 Nausea with vomiting, unspecified: Secondary | ICD-10-CM | POA: Diagnosis not present

## 2015-07-01 DIAGNOSIS — Z8719 Personal history of other diseases of the digestive system: Secondary | ICD-10-CM | POA: Diagnosis not present

## 2015-07-01 DIAGNOSIS — M158 Other polyosteoarthritis: Secondary | ICD-10-CM | POA: Diagnosis not present

## 2015-07-01 DIAGNOSIS — B20 Human immunodeficiency virus [HIV] disease: Secondary | ICD-10-CM | POA: Insufficient documentation

## 2015-07-01 DIAGNOSIS — G43909 Migraine, unspecified, not intractable, without status migrainosus: Secondary | ICD-10-CM | POA: Insufficient documentation

## 2015-07-01 DIAGNOSIS — Z3202 Encounter for pregnancy test, result negative: Secondary | ICD-10-CM | POA: Insufficient documentation

## 2015-07-01 DIAGNOSIS — G8929 Other chronic pain: Secondary | ICD-10-CM | POA: Diagnosis not present

## 2015-07-01 DIAGNOSIS — F17210 Nicotine dependence, cigarettes, uncomplicated: Secondary | ICD-10-CM | POA: Diagnosis not present

## 2015-07-01 DIAGNOSIS — Z79899 Other long term (current) drug therapy: Secondary | ICD-10-CM | POA: Insufficient documentation

## 2015-07-01 DIAGNOSIS — Z9049 Acquired absence of other specified parts of digestive tract: Secondary | ICD-10-CM | POA: Diagnosis not present

## 2015-07-01 DIAGNOSIS — F419 Anxiety disorder, unspecified: Secondary | ICD-10-CM | POA: Diagnosis not present

## 2015-07-01 DIAGNOSIS — Z791 Long term (current) use of non-steroidal anti-inflammatories (NSAID): Secondary | ICD-10-CM | POA: Insufficient documentation

## 2015-07-01 DIAGNOSIS — Z7952 Long term (current) use of systemic steroids: Secondary | ICD-10-CM | POA: Insufficient documentation

## 2015-07-01 DIAGNOSIS — Z8701 Personal history of pneumonia (recurrent): Secondary | ICD-10-CM | POA: Diagnosis not present

## 2015-07-01 LAB — URINALYSIS, ROUTINE W REFLEX MICROSCOPIC
GLUCOSE, UA: NEGATIVE mg/dL
HGB URINE DIPSTICK: NEGATIVE
Ketones, ur: 15 mg/dL — AB
Leukocytes, UA: NEGATIVE
Nitrite: NEGATIVE
PROTEIN: NEGATIVE mg/dL
Specific Gravity, Urine: 1.019 (ref 1.005–1.030)
pH: 8 (ref 5.0–8.0)

## 2015-07-01 LAB — CBC WITH DIFFERENTIAL/PLATELET
Basophils Absolute: 0 10*3/uL (ref 0.0–0.1)
Basophils Relative: 0 %
EOS ABS: 0.2 10*3/uL (ref 0.0–0.7)
EOS PCT: 3 %
HCT: 40.9 % (ref 36.0–46.0)
Hemoglobin: 13.8 g/dL (ref 12.0–15.0)
LYMPHS ABS: 2.1 10*3/uL (ref 0.7–4.0)
LYMPHS PCT: 26 %
MCH: 31.8 pg (ref 26.0–34.0)
MCHC: 33.7 g/dL (ref 30.0–36.0)
MCV: 94.2 fL (ref 78.0–100.0)
MONO ABS: 0.4 10*3/uL (ref 0.1–1.0)
MONOS PCT: 6 %
Neutro Abs: 5.3 10*3/uL (ref 1.7–7.7)
Neutrophils Relative %: 65 %
PLATELETS: 220 10*3/uL (ref 150–400)
RBC: 4.34 MIL/uL (ref 3.87–5.11)
RDW: 12.3 % (ref 11.5–15.5)
WBC: 8.1 10*3/uL (ref 4.0–10.5)

## 2015-07-01 LAB — BASIC METABOLIC PANEL
Anion gap: 13 (ref 5–15)
CO2: 23 mmol/L (ref 22–32)
CREATININE: 0.75 mg/dL (ref 0.44–1.00)
Calcium: 9.6 mg/dL (ref 8.9–10.3)
Chloride: 104 mmol/L (ref 101–111)
GFR calc Af Amer: >60 mL/min (ref >60)
GLUCOSE: 107 mg/dL — AB (ref 65–99)
POTASSIUM: 3.7 mmol/L (ref 3.5–5.1)
SODIUM: 140 mmol/L (ref 135–145)

## 2015-07-01 LAB — PREGNANCY, URINE: PREG TEST UR: NEGATIVE

## 2015-07-01 MED ORDER — OXYCODONE-ACETAMINOPHEN 5-325 MG PO TABS
1.0000 | ORAL_TABLET | Freq: Once | ORAL | Status: AC
  Administered 2015-07-01: 1 via ORAL

## 2015-07-01 MED ORDER — MORPHINE SULFATE (PF) 4 MG/ML IV SOLN
4.0000 mg | Freq: Once | INTRAVENOUS | Status: AC
  Administered 2015-07-01: 4 mg via INTRAVENOUS
  Filled 2015-07-01: qty 1

## 2015-07-01 MED ORDER — NAPROXEN 375 MG PO TABS
375.0000 mg | ORAL_TABLET | Freq: Three times a day (TID) | ORAL | Status: DC
Start: 2015-07-01 — End: 2015-07-30

## 2015-07-01 MED ORDER — OXYCODONE-ACETAMINOPHEN 5-325 MG PO TABS
ORAL_TABLET | ORAL | Status: AC
  Filled 2015-07-01: qty 1

## 2015-07-01 MED ORDER — ONDANSETRON HCL 4 MG/2ML IJ SOLN
4.0000 mg | Freq: Once | INTRAMUSCULAR | Status: AC
  Administered 2015-07-01: 4 mg via INTRAVENOUS
  Filled 2015-07-01: qty 2

## 2015-07-01 MED ORDER — CYCLOBENZAPRINE HCL 5 MG PO TABS: 5.0000 mg | ORAL_TABLET | Freq: Three times a day (TID) | ORAL | Status: DC | PRN

## 2015-07-01 MED ORDER — SODIUM CHLORIDE 0.9 % IV BOLUS (SEPSIS)
1000.0000 mL | Freq: Once | INTRAVENOUS | Status: AC
  Administered 2015-07-01: 1000 mL via INTRAVENOUS

## 2015-07-01 NOTE — Discharge Instructions (Signed)
Flank Pain Flank pain is pain in your side. The flank is the area of your side between your upper belly (abdomen) and your back. Pain in this area can be caused by many different things. HOME CARE Home care and treatment will depend on the cause of your pain.  Rest as told by your doctor.  Drink enough fluids to keep your pee (urine) clear or pale yellow.  Only take medicine as told by your doctor.  Tell your doctor about any changes in your pain.  Follow up with your doctor. GET HELP RIGHT AWAY IF:   Your pain does not get better with medicine.   You have new symptoms or your symptoms get worse.  Your pain gets worse.   You have belly (abdominal) pain.   You are short of breath.   You always feel sick to your stomach (nauseous).   You keep throwing up (vomiting).   You have puffiness (swelling) in your belly.   You feel light-headed or you pass out (faint).   You have blood in your pee.  You have a fever or lasting symptoms for more than 2-3 days.  You have a fever and your symptoms suddenly get worse. MAKE SURE YOU:   Understand these instructions.  Will watch your condition.  Will get help right away if you are not doing well or get worse.   This information is not intended to replace advice given to you by your health care provider. Make sure you discuss any questions you have with your health care provider.   Document Released: 02/23/2008 Document Revised: 06/06/2014 Document Reviewed: 12/29/2011 Elsevier Interactive Patient Education 2016 [employer]. You do not have a urinary tract infection.  Your pregnancy test is normal, your lab values are all within normal parameters.  You've been given a prescription for Naprosyn to take 2-3 times a day as well as Flexeril as your symptoms appear to be more muscular in nature.  Please make an appointment with your primary care physician for follow up care

## 2015-07-01 NOTE — ED Notes (Addendum)
Pt reports right side pain which radiates from the top of her hip up the right side and is worse with movement. Pt reports recently taking provera and thinks it may be related. Pt alert x4. Pt denies any urinary symptoms.

## 2015-07-01 NOTE — ED Notes (Signed)
Bladder scan 

## 2015-07-01 NOTE — ED Provider Notes (Signed)
CSN: [id number]     Arrival date & time 07/01/15  1613 History   First MD Initiated Contact with Patient 07/01/15 1816     Chief Complaint  Patient presents with  . Flank Pain   HPI  [name] is a 35 year old female with PMHx of HIV, anxiety, migraines, seizures and depression presenting with flank pain. The symptoms began 3 days ago. The pain is described as sharp and shooting. It originates in her right flank and radiates towards her hip. She states the pain is constant but waxes and wanes in intensity. States that movement and standing up straight aggravates the pain. She has not found alleviating factors. She reports associated nausea and vomiting. She denies a history of kidney stones. Denies fevers, chills, headaches, dizziness, syncope, chest pain, shortness of breath, abdominal pain, diarrhea, vaginal discharge, dysuria, hematuria, difficulty urinating or increased frequency of urination. She has taken Tylenol at home without relief. She has no other complaints today. She reports a history of urinary retention for which she takes daily Flomax. She states that she has been evaluated by urology but they are unsure why she has urinary retention. She states she has been urinating regularly.  Past Medical History  Diagnosis Date  . HIV infection (HCC) 2016  . Anxiety   . Arthritis 2000    Right wrist/hand, prior fracture  . Chronic headaches 2005    Migraines  . Depression 2001  . Gallstones   . Pneumonia   . Seizures (HCC)   . 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 Sleep apnea   . PAC (premature atrial contraction) 06/24/2015   Past Surgical History  Procedure Laterality Date  . Cholecystectomy    . Fracture surgery      2-right hand  . Ureter surgery      Placement and removal   . Rotator cuff repair Right 2000    repair frozen shoulder 2001   Family History  Problem Relation Age of Onset  . Diabetes Maternal Grandfather   .  Heart disease Maternal Grandfather   . Seizures Neg Hx   . Breast cancer Mother    Social History  Substance Use Topics  . Smoking status: Current Every Day Smoker -- 1.00 packs/day for 0 years    Types: Cigarettes  . Smokeless tobacco: Never Used  . Alcohol Use: No   OB History    No data available     Review of Systems  Gastrointestinal: Positive for nausea and vomiting.  Genitourinary: Positive for flank pain.  All other systems reviewed and are negative.     Allergies  Robinul  Home Medications   Prior to Admission medications   Medication Sig Start Date End Date Taking? Authorizing Provider  buprenorphine (SUBUTEX) 8 MG SUBL SL tablet Place 8 mg under the tongue 3 (three) times daily.     Historical Provider, MD  dolutegravir (TIVICAY) 50 MG tablet Take 1 tablet (50 mg total) by mouth daily. 10/08/14   Randall Hiss, MD  fludrocortisone (FLORINEF) 0.1 MG tablet Take 1 tablet (0.1 mg total) by mouth daily. 03/19/15   Chilton Si, MD  lamivudine (EPIVIR) 300 MG tablet Take 1 tablet (300 mg total) by mouth daily. 10/08/14   Randall Hiss, MD  lamoTRIgine (LAMICTAL) 200 MG tablet Take 1 tablet (200 mg total) by mouth 2 (two) times daily. 05/20/15   Anson Fret, MD  medroxyPROGESTERone (  PROVERA) 10 MG tablet Take 1 tablet (10 mg total) by mouth daily. 06/17/15   Smitty Cords, DO  naproxen (NAPROSYN) 375 MG tablet Take 1 tablet (375 mg total) by mouth 2 (two) times daily. 04/14/15   Tiffany Neva Seat, PA-C  ondansetron (ZOFRAN-ODT) 4 MG disintegrating tablet Take 1 tablet (4 mg total) by mouth every 8 (eight) hours as needed for nausea. 05/20/15   Anson Fret, MD  ranitidine (ZANTAC) 150 MG tablet Take 1 tablet (150 mg total) by mouth 2 (two) times daily. 06/17/15   Smitty Cords, DO  tamsulosin (FLOMAX) 0.4 MG CAPS capsule Take 0.4 mg by mouth daily. 01/22/15   Historical Provider, MD  traZODone (DESYREL) 100 MG tablet Take 200 mg by mouth  at bedtime as needed. 02/03/15   Historical Provider, MD   BP 154/96 mmHg  Pulse 65  Temp(Src) 98.4 F (36.9 C) (Oral)  Resp 18  SpO2 99%  LMP 06/17/2015 Physical Exam  Constitutional: She appears well-developed and well-nourished. She appears distressed.  Appears in pain.  HENT:  Head: Normocephalic and atraumatic.  Eyes: Conjunctivae are normal. Right eye exhibits no discharge. Left eye exhibits no discharge. No scleral icterus.  Neck: Normal range of motion. Neck supple.  Cardiovascular: Normal rate, regular rhythm and normal heart sounds.   Pulmonary/Chest: Effort normal and breath sounds normal. No respiratory distress.  Abdominal: Soft. Bowel sounds are normal. There is no tenderness. There is no rebound and no guarding.  Tender to deep palpation over right flank.  Musculoskeletal: Normal range of motion.       Back:  Neurological: She is alert. Coordination normal.  Skin: Skin is warm and dry. No rash noted.  No rash or skin changes over the right flank.  Psychiatric: She has a normal mood and affect. Her behavior is normal.  Nursing note and vitals reviewed.   ED Course  Procedures (including critical care time) Labs Review Labs Reviewed  BASIC METABOLIC PANEL - Abnormal; Notable for the following:    Glucose, Bld 107 (*)    BUN <5 (*)    All other components within normal limits  URINALYSIS, ROUTINE W REFLEX MICROSCOPIC (NOT AT [facility]) - Abnormal; Notable for the following:    Color, Urine AMBER (*)    Bilirubin Urine SMALL (*)    Ketones, ur 15 (*)    All other components within normal limits  CBC WITH DIFFERENTIAL/PLATELET  POC URINE PREG, ED    Imaging Review No results found. I have personally reviewed and evaluated these images and lab results as part of my medical decision-making.   EKG Interpretation None      MDM   Final diagnoses:  Flank pain   35 year old female with PMHx of urinary retention presenting with right-sided flank pain 3 days.  Associated nausea and vomiting. Urinary symptoms. Vital signs stable. She is nontoxic-appearing. Tenderness to deep palpation over the right flank. Abdomen is soft, nontender without peritoneal signs. Blood work unremarkable. Urine without blood, leukocytes, nitrites or bacteria. Pain controlled in ED with morphine and zofran. Discussed pt with Dr. Radford Pax who recommends urine pregnancy and bladder scan. If pt is not pregnant and there is no urinary retention, she is stable for discharge without further work up in ED. Patient care signed out to oncoming provider, [name], pending urine pregnancy results.     Sally Gala Armanii Pressnell, PA-C 07/01/15 2021  Sally Heimlich, PA-C 07/01/15 2025  Sally Nay, MD 07/05/15 [phone]

## 2015-07-01 NOTE — ED Notes (Signed)
Pt ambulates independently and with steady gait at time of discharge. Discharge instructions and follow up information reviewed with patient. No other questions or concerns voiced at this time. RX x 2 given. 

## 2015-07-01 NOTE — ED Provider Notes (Signed)
Assumed care at shift change from [facility] and it instructed to follow-up on a pregnancy test.  If negative patient can be discharged home with anti-inflammatory and muscle relaxers and instructions to follow-up with her primary care physician.  The rest of her workup is negative for kidney stones  Sally Favor, NP 07/01/15 2116

## 2015-07-02 ENCOUNTER — Encounter: Payer: Self-pay | Admitting: Infectious Disease

## 2015-07-02 LAB — HIV-1 RNA QUANT-NO REFLEX-BLD: HIV-1 RNA Viral Load: <40

## 2015-07-09 ENCOUNTER — Encounter: Payer: Self-pay | Admitting: Infectious Disease

## 2015-07-15 ENCOUNTER — Ambulatory Visit: Payer: Medicare Other | Admitting: Family Medicine

## 2015-07-30 ENCOUNTER — Ambulatory Visit (INDEPENDENT_AMBULATORY_CARE_PROVIDER_SITE_OTHER): Payer: Medicare Other | Admitting: Family Medicine

## 2015-07-30 ENCOUNTER — Encounter: Payer: Self-pay | Admitting: Family Medicine

## 2015-07-30 VITALS — BP 135/80 | HR 85 | Temp 98.2°F | Ht 67.0 in | Wt 117.0 lb

## 2015-07-30 DIAGNOSIS — R1010 Upper abdominal pain, unspecified: Secondary | ICD-10-CM | POA: Diagnosis not present

## 2015-07-30 DIAGNOSIS — R1012 Left upper quadrant pain: Secondary | ICD-10-CM

## 2015-07-30 DIAGNOSIS — N921 Excessive and frequent menstruation with irregular cycle: Secondary | ICD-10-CM | POA: Diagnosis not present

## 2015-07-30 DIAGNOSIS — R1011 Right upper quadrant pain: Secondary | ICD-10-CM

## 2015-07-30 MED ORDER — NAPROXEN 500 MG PO TABS: 500.0000 mg | ORAL_TABLET | Freq: Two times a day (BID) | ORAL | Status: DC

## 2015-07-30 NOTE — Patient Instructions (Signed)
Thank you for coming in to clinic today.  1. For your Heavy Bleeding - Referral placed to GYN, you should hear back about this appointment within next few weeks, call us to check status if it has been 3 or more weeks Recommend trial of Anti-inflammatory with Naproxen (Naprosyn)  tabs - take one with food and plenty of water TWICE daily every day (breakfast and dinner), for next 2 weeks EVERYDAY then you may take only as needed - DO NOT TAKE any ibuprofen, aleve, motrin while you are taking this medicine - It is safe to take Tylenol Ext Str  tabs - take 1 to 2 (max dose ) every 6 hours as needed for breakthrough pain, max 24 hour daily dose is 6 to 8 tablets or   This may help your upper abdominal pain as well - Recommend to follow-up with Dr Guss Bunde (Psychiatry) to discuss potential "Somatization" or Chronic Abdominal Pain related to nerves/psych diagnosis  Please schedule a follow-up appointment with Dr Althea Charon within 3 months for Upper Abdominal Pain and Vaginal Bleeding  If you have any other questions or concerns, please feel free to call the clinic to contact me. You may also schedule an earlier appointment if necessary.  However, if your symptoms get significantly worse, please go to the Emergency Department to seek immediate medical attention.  [name], DO [facility]

## 2015-07-30 NOTE — Assessment & Plan Note (Addendum)
AUB now with menorrhagia and dysmenorrhea x 1-2 months, currently inc bleeding after trial on Provera x 5 days in 05/2015. - Last Hgb 05/2015 12.6, stable. No new symptoms of anemia.  Plan: 1. Referral to GYN for further work-up and management, patient is interested in potential future surgical or definitive management. May benefit from [place], age 35 consider potential inc risk endometrial hyperplasia and carcinoma, however not obese and no fam history 2. Trial on Naproxen for dysmenorrhea

## 2015-07-30 NOTE — Assessment & Plan Note (Signed)
Persistent upper L>R abdominal pain, remains unclear etiology - See prior note. Concern potential somatization with chronic pain and prior negative work-ups.  Plan: 1. May continue H2 or PPI if helping 2. Rx Naproxen  BID x 2 weeks regular then PRN for dysmenorrhea, also see if helps pain, may be more abdominal wall MSK inflammation 3. Advised to follow-up with Psychiatry (Dr Guss Bunde, at [facility]), may f/u with GI

## 2015-07-30 NOTE — Progress Notes (Signed)
Subjective:    Patient ID: [name], female    DOB: Jul 10, 1980, 35 y.o.   MRN: [id number]  EMIE SOMMERFELD is a 35 y.o. female presenting on 07/30/2015 for Follow-up GYN / Menstrual Cycle  HPI    MENORRHAGIA, ABNORMAL UTERINE BLEEDING: - Last seen for same complaint 06/17/2015, with prolonged amenorrhea 1-2 year then AUB with heavy irregular periods since 04/2015. Treated with Provera  x 5 days, which seemed to trigger her menstrual period again with heavy bleeding for up to 2 weeks assoc w/ abdominal cramping, nausea / vomiting. - Now complains of returned heavy period within 2 weeks, onset 07/27/15 heavy periods immediately, 4 days in now, still persistent, 12g tampon 1-2 hours, again with nausea / vomiting, pelvic cramping, taking Tylenol as needed, not tried NSAIDs. Zofran ODT  1-2x daily with good relief. - Not sexually active - No history or family history of endometrial CA - Interested in referral to GYN - Denies any lightheadedness, fatigue, pre-syncope or syncopal episodes, CP, SOB  ABDOMINAL PAIN, UPPER, CHRONIC: - Chronic problem >1 year, see prior notes and work-up. Last discussed 05/2015, trial of H2 blocker and PPI without relief, seems persistent upper abdominal pain, not related to eating. H pylori screening negative. Seen in ED recently. - Today pain is persistent without significant improvement or worsening. Tolerating PO. - Established with GI s/p EGD last year, no further work-up planned - Does see Psychiatry, has not followed up with them - Denies any fevers, lower abdominal pain, diarrhea, constipation, rectal bleeding  Social History  Substance Use Topics  . Smoking status: Current Every Day Smoker -- 1.00 packs/day for 0 years    Types: Cigarettes  . Smokeless tobacco: Never Used  . Alcohol Use: No    Review of Systems Per HPI unless specifically indicated above     Objective:    BP 135/80 mmHg  Pulse 85  Temp(Src) 98.2 F (36.8 C)  (Oral)  Ht  (1.702 m)  Wt 117 lb (53.071 kg)  BMI 18.32 kg/m2  LMP 06/17/2015  Wt Readings from Last 3 Encounters:  07/30/15 117 lb (53.071 kg)  06/24/15 117 lb (53.071 kg)  06/19/15 116 lb 8 oz (52.844 kg)    Physical Exam  Constitutional: She appears well-developed and well-nourished. No distress.  Well-appearing, comfortable and cooperative  HENT:  Mouth/Throat: Oropharynx is clear and moist.  Cardiovascular: Normal rate and intact distal pulses.   Pulmonary/Chest: Effort normal and breath sounds normal. She has no wheezes. She has no rales.  Abdominal: Soft. Bowel sounds are normal. She exhibits no distension and no mass. There is no tenderness. There is no rebound and no guarding.  Persistent mild Left only upper abdominal quad tenderness. Similar to previous but slightly improved today. Negative Murphy's (s/p choley), lower abdomen non-tender. Soft, non-distended.  Genitourinary:  Again, declined repeat pelvic exam. Last normal 02/2015.  Neurological: She is alert.  Skin: Skin is warm and dry. She is not diaphoretic.  Psychiatric: She has a normal mood and affect. Her behavior is normal. Thought content normal.  Nursing note and vitals reviewed.      Assessment & Plan:   Problem List Items Addressed This Visit    Abdominal pain, bilateral upper quadrant    Persistent upper L>R abdominal pain, remains unclear etiology - See prior note. Concern potential somatization with chronic pain and prior negative work-ups.  Plan: 1. May continue H2 or PPI if helping 2. Rx Naproxen  BID x 2 weeks  regular then PRN for dysmenorrhea, also see if helps pain, may be more abdominal wall MSK inflammation 3. Advised to follow-up with Psychiatry (Dr Guss Bunde, at [facility]), may f/u with GI      Relevant Medications   naproxen (NAPROSYN) 500 MG tablet   Menorrhagia with irregular cycle - Primary    AUB now with menorrhagia and dysmenorrhea x 1-2 months, currently inc  bleeding after trial on Provera x 5 days in 05/2015. - Last Hgb 05/2015 12.6, stable. No new symptoms of anemia.  Plan: 1. Referral to GYN for further work-up and management, patient is interested in potential future surgical or definitive management. May benefit from [place], age 62 consider potential inc risk endometrial hyperplasia and carcinoma, however not obese and no fam history 2. Trial on Naproxen for dysmenorrhea      Relevant Medications   naproxen (NAPROSYN) 500 MG tablet   Other Relevant Orders   Ambulatory referral to Gynecology      Meds ordered this encounter  Medications  . naproxen (NAPROSYN) 500 MG tablet    Sig: Take 1 tablet (500 mg total) by mouth 2 (two) times daily with a meal.    Dispense:  60 tablet    Refill:  1    Follow up plan: Return in about 3 months (around 10/30/2015) for follow-up AUB, chronic abdominal pain.  [name], DO [facility], PGY-3

## 2015-08-13 ENCOUNTER — Encounter (INDEPENDENT_AMBULATORY_CARE_PROVIDER_SITE_OTHER): Payer: Medicare Other | Admitting: *Deleted

## 2015-08-13 VITALS — BP 137/87 | HR 75 | Temp 98.5°F | Resp 16 | Wt 118.5 lb

## 2015-08-13 DIAGNOSIS — E785 Hyperlipidemia, unspecified: Secondary | ICD-10-CM

## 2015-08-13 DIAGNOSIS — Z006 Encounter for examination for normal comparison and control in clinical research program: Secondary | ICD-10-CM

## 2015-08-13 LAB — POCT URINALYSIS DIPSTICK
BILIRUBIN UA: NEGATIVE
Blood, UA: NEGATIVE
Glucose, UA: NEGATIVE
KETONES UA: NEGATIVE
LEUKOCYTES UA: NEGATIVE
NITRITE UA: NEGATIVE
PROTEIN UA: NEGATIVE
Spec Grav, UA: 1.025
Urobilinogen, UA: 0.2
pH, UA: 6

## 2015-08-13 LAB — COMPREHENSIVE METABOLIC PANEL
ALT: 10 U/L (ref 6–29)
AST: 15 U/L (ref 10–30)
Albumin: 4.5 g/dL (ref 3.6–5.1)
Alkaline Phosphatase: 66 U/L (ref 33–115)
BUN: 11 mg/dL (ref 7–25)
CALCIUM: 9.4 mg/dL (ref 8.6–10.2)
CHLORIDE: 100 mmol/L (ref 98–110)
CO2: 28 mmol/L (ref 20–31)
Creat: 0.77 mg/dL (ref 0.50–1.10)
GLUCOSE: 131 mg/dL — AB (ref 65–99)
POTASSIUM: 4.9 mmol/L (ref 3.5–5.3)
Sodium: 141 mmol/L (ref 135–146)
Total Bilirubin: 0.4 mg/dL (ref 0.2–1.2)
Total Protein: 6.9 g/dL (ref 6.1–8.1)

## 2015-08-13 LAB — LIPID PANEL
CHOL/HDL RATIO: 4.6 ratio (ref <=5.0)
Cholesterol: 184 mg/dL (ref 125–200)
HDL: 40 mg/dL — AB (ref >=46)
LDL Cholesterol: 114 mg/dL (ref <130)
Triglycerides: 151 mg/dL — ABNORMAL HIGH (ref <150)
VLDL: 30 mg/dL (ref <30)

## 2015-08-13 LAB — BILIRUBIN, DIRECT: Bilirubin, Direct: 0.1 mg/dL (ref <=0.2)

## 2015-08-13 LAB — CD4/CD8 (T-HELPER/T-SUPPRESSOR CELL)
CD4%: 37.8
CD4: 983
CD8 % Suppressor T Cell: 30.4
CD8: 790

## 2015-08-13 LAB — HIV-1 RNA QUANT-NO REFLEX-BLD: HIV-1 RNA Viral Load: <40

## 2015-08-13 NOTE — Progress Notes (Signed)
Sally DikeJennifer is here for her week 48 visit for A5353, a Study to Evaluate Dolutegravir and lamivudine Dual Therapy for the Treatment of Naive HIV-1 Infected Participants. She denies any new problems and says she has not missed any doses of her HIV meds. She continues to have chronic abd pain and nausea at times. She will see Dr. Daiva EvesVan Dam at the time of her last visit for research and will need a prescription for meds.

## 2015-08-17 ENCOUNTER — Telehealth: Payer: Self-pay | Admitting: Neurology

## 2015-08-17 NOTE — Telephone Encounter (Signed)
Called patient and left her message asking her to call the office back. Dr. Lucia GaskinsAhern will be out of the office 08-18-2015. Patient can reschedule with Dr. Lucia GaskinsAhern next follow up or patient can go in NP slot for this week.

## 2015-08-18 ENCOUNTER — Ambulatory Visit: Payer: Medicare Other | Admitting: Neurology

## 2015-08-26 ENCOUNTER — Ambulatory Visit (INDEPENDENT_AMBULATORY_CARE_PROVIDER_SITE_OTHER): Payer: Medicare Other | Admitting: Neurology

## 2015-08-26 ENCOUNTER — Encounter: Payer: Self-pay | Admitting: Neurology

## 2015-08-26 VITALS — BP 124/78 | HR 78 | Ht 67.0 in | Wt 124.0 lb

## 2015-08-26 DIAGNOSIS — G500 Trigeminal neuralgia: Secondary | ICD-10-CM | POA: Diagnosis not present

## 2015-08-26 DIAGNOSIS — R1012 Left upper quadrant pain: Secondary | ICD-10-CM

## 2015-08-26 DIAGNOSIS — G40909 Epilepsy, unspecified, not intractable, without status epilepticus: Secondary | ICD-10-CM | POA: Insufficient documentation

## 2015-08-26 HISTORY — DX: Trigeminal neuralgia: G50.0

## 2015-08-26 MED ORDER — METHYLPREDNISOLONE 4 MG PO TBPK: ORAL_TABLET | ORAL | Status: DC

## 2015-08-26 MED ORDER — BACLOFEN 10 MG PO TABS: 10.0000 mg | ORAL_TABLET | Freq: Three times a day (TID) | ORAL | Status: DC

## 2015-08-26 MED ORDER — GABAPENTIN 300 MG PO CAPS: 300.0000 mg | ORAL_CAPSULE | Freq: Three times a day (TID) | ORAL | Status: DC

## 2015-08-26 NOTE — Patient Instructions (Signed)
Remember to drink plenty of fluid, eat healthy meals and do not skip any meals. Try to eat protein with a every meal and eat a healthy snack such as fruit or nuts in between meals. Try to keep a regular sleep-wake schedule and try to exercise daily, particularly in the form of walking, 20-30 minutes a day, if you can.   As far as your medications are concerned, I would like to suggest: With Trigeminal Neuralgia episodes try Neurontin and/or Baclofen For abdominal pain will get labs, follow up with primary care  As far as diagnostic testing: labs today  I would like to see you back in 3 months, sooner if we need to. Please call us with any interim questions, concerns, problems, updates or refill requests.   Our phone number is [phone]. We also have an after hours call service for urgent matters and there is a physician on-call for urgent questions. For any emergencies you know to call 911 or go to the nearest emergency room
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
## 2015-08-27 ENCOUNTER — Telehealth: Payer: Self-pay | Admitting: *Deleted

## 2015-08-27 LAB — AMYLASE: AMYLASE: 56 U/L (ref 31–124)

## 2015-08-27 LAB — LIPASE: LIPASE: 37 U/L (ref 0–59)

## 2015-08-27 NOTE — Telephone Encounter (Signed)
-----   Message from Anson FretAntonia B Ahern, MD sent at 08/27/2015 10:34 AM EDT ----- Lab normal

## 2015-08-27 NOTE — Telephone Encounter (Signed)
Called and spoke to pt about normal labs per Dr Ahern. She verbalized understanding.  

## 2015-08-31 ENCOUNTER — Encounter: Payer: Self-pay | Admitting: Infectious Disease

## 2015-09-16 ENCOUNTER — Ambulatory Visit (INDEPENDENT_AMBULATORY_CARE_PROVIDER_SITE_OTHER): Payer: Medicare Other | Admitting: Infectious Disease

## 2015-09-16 ENCOUNTER — Encounter: Payer: Self-pay | Admitting: Infectious Disease

## 2015-09-16 ENCOUNTER — Encounter (INDEPENDENT_AMBULATORY_CARE_PROVIDER_SITE_OTHER): Payer: Self-pay | Admitting: *Deleted

## 2015-09-16 VITALS — BP 128/86 | HR 69 | Temp 98.4°F | Resp 16 | Wt 124.2 lb

## 2015-09-16 DIAGNOSIS — D508 Other iron deficiency anemias: Secondary | ICD-10-CM

## 2015-09-16 DIAGNOSIS — R569 Unspecified convulsions: Secondary | ICD-10-CM | POA: Diagnosis not present

## 2015-09-16 DIAGNOSIS — F250 Schizoaffective disorder, bipolar type: Secondary | ICD-10-CM

## 2015-09-16 DIAGNOSIS — F1991 Other psychoactive substance use, unspecified, in remission: Secondary | ICD-10-CM

## 2015-09-16 DIAGNOSIS — Z006 Encounter for examination for normal comparison and control in clinical research program: Secondary | ICD-10-CM

## 2015-09-16 DIAGNOSIS — Z87898 Personal history of other specified conditions: Secondary | ICD-10-CM

## 2015-09-16 DIAGNOSIS — B20 Human immunodeficiency virus [HIV] disease: Secondary | ICD-10-CM

## 2015-09-16 DIAGNOSIS — D509 Iron deficiency anemia, unspecified: Secondary | ICD-10-CM

## 2015-09-16 DIAGNOSIS — F1990 Other psychoactive substance use, unspecified, uncomplicated: Secondary | ICD-10-CM

## 2015-09-16 LAB — CBC WITH DIFFERENTIAL/PLATELET
BASOS PCT: 1 %
Basophils Absolute: 55 cells/uL (ref 0–200)
EOS ABS: 220 {cells}/uL (ref 15–500)
Eosinophils Relative: 4 %
HEMATOCRIT: 39.1 % (ref 35.0–45.0)
HEMOGLOBIN: 12.9 g/dL (ref 11.7–15.5)
LYMPHS ABS: 1595 {cells}/uL (ref 850–3900)
Lymphocytes Relative: 29 %
MCH: 31.6 pg (ref 27.0–33.0)
MCHC: 33 g/dL (ref 32.0–36.0)
MCV: 95.8 fL (ref 80.0–100.0)
MONO ABS: 330 {cells}/uL (ref 200–950)
MPV: 8.8 fL (ref 7.5–12.5)
Monocytes Relative: 6 %
Neutro Abs: 3300 cells/uL (ref 1500–7800)
Neutrophils Relative %: 60 %
Platelets: 220 10*3/uL (ref 140–400)
RBC: 4.08 MIL/uL (ref 3.80–5.10)
RDW: 13.5 % (ref 11.0–15.0)
WBC: 5.5 10*3/uL (ref 3.8–10.8)

## 2015-09-16 LAB — COMPREHENSIVE METABOLIC PANEL
ALBUMIN: 4.4 g/dL (ref 3.6–5.1)
ALT: 10 U/L (ref 6–29)
AST: 14 U/L (ref 10–30)
Alkaline Phosphatase: 65 U/L (ref 33–115)
BUN: 6 mg/dL — ABNORMAL LOW (ref 7–25)
CHLORIDE: 105 mmol/L (ref 98–110)
CO2: 27 mmol/L (ref 20–31)
Calcium: 9.2 mg/dL (ref 8.6–10.2)
Creat: 0.8 mg/dL (ref 0.50–1.10)
Glucose, Bld: 127 mg/dL — ABNORMAL HIGH (ref 65–99)
POTASSIUM: 4.6 mmol/L (ref 3.5–5.3)
Sodium: 142 mmol/L (ref 135–146)
TOTAL PROTEIN: 6.9 g/dL (ref 6.1–8.1)
Total Bilirubin: 0.4 mg/dL (ref 0.2–1.2)

## 2015-09-16 LAB — BILIRUBIN, DIRECT: Bilirubin, Direct: 0.1 mg/dL (ref <=0.2)

## 2015-09-16 MED ORDER — EMTRICITABINE-TENOFOVIR AF 200-25 MG PO TABS: 1.0000 | ORAL_TABLET | Freq: Every day | ORAL | Status: DC

## 2015-09-16 MED ORDER — DOLUTEGRAVIR SODIUM 50 MG PO TABS: 50.0000 mg | ORAL_TABLET | Freq: Every day | ORAL | Status: DC

## 2015-09-16 MED FILL — TIVICAY 50 MG TABLET: 50 | 30 days supply | Qty: 30 | Fill #0

## 2015-09-16 MED FILL — DESCOVY 200-25 MG TABS: 200-25 | 30 days supply | Qty: 30 | Fill #0

## 2015-09-16 NOTE — Progress Notes (Signed)
Final visit for A5353, a Study to Evaluate Dolutegravir and lamivudine Dual Therapy for the Treatment of Naive HIV-1 Infected Participants. She will see Dr. Daiva EvesVan Dam today and get a prescription for new HIV medication. She returned 2 pills of each drug today and says she never misses a dose. She denies any new problems or concerns. She was started on gabapentin for "trigeminal neuralgia diagnosed by a neurologist which she says is helping.

## 2015-09-16 NOTE — Progress Notes (Signed)
Chief complaint: followup for HIV on medications  Subjective:    Patient ID: [name], female    DOB: 1981-05-27, 35 y.o.   MRN: [id number]  HPI   35 year old Caucasian lady with HIV infection recently diagnosed and started on anti-retroviral indications in the form of TIVICAY and Epivir via the AIDS clinical trial group.   She is doing very well and is having her seizures managed by Neurology and also seeing a psychiatrist and is on suboxone for opiate dependence as well as alpha blocker for urinary retention.  She is here for followup and just finished her last dose of study drugs. She has [employer] and now with a prescription drug plan. We reviewed treatment options and we decided to go with Tivicay and Descovy.    Past Medical History  Diagnosis Date  . HIV infection (HCC) 2016  . Anxiety   . Arthritis 2000    Right wrist/hand, prior fracture  . Chronic headaches 2005    Migraines  . Depression 2001  . Gallstones   . Pneumonia   . Seizures (HCC)   . 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 Sleep apnea   . PAC (premature atrial contraction) 06/24/2015    Past Surgical History  Procedure Laterality Date  . Cholecystectomy    . Fracture surgery      2-right hand  . Ureter surgery      Placement and removal   . Rotator cuff repair Right 2000    repair frozen shoulder 2001    Family History  Problem Relation Age of Onset  . Diabetes Maternal Grandfather   . Heart disease Maternal Grandfather   . Seizures Neg Hx   . Breast cancer Mother       Social History   Social History  . Marital Status: Legally Separated    Spouse Name: [name]  . Number of Children: 2  . Years of Education: 12+   Occupational History  . Unemployed    Social History Main Topics  . Smoking status: Current Every Day Smoker -- 1.00 packs/day for 0 years    Types: Cigarettes  . Smokeless tobacco: Never Used  . Alcohol Use: No  . Drug  Use: 7.00 per week    Special: Marijuana, Heroin  . Sexual Activity: Not Asked     Comment: declined condoms   Other Topics Concern  . None   Social History Narrative   Lives at home with kids   Caffeine use: Drinks coffee (1 cup per day)    Allergies  Allergen Reactions  . Robinul [Glycopyrrolate] Other (See Comments)    Urinary retention     Current outpatient prescriptions:  .  baclofen (LIORESAL) 10 MG tablet, Take 1 tablet (10 mg total) by mouth 3 (three) times daily., Disp: 30 each, Rfl: 11 .  buprenorphine (SUBUTEX) 8 MG SUBL SL tablet, Place 8 mg under the tongue 3 (three) times daily. , Disp: , Rfl:  .  dolutegravir (TIVICAY) 50 MG tablet, Take 1 tablet (50 mg total) by mouth daily., Disp: 30 tablet, Rfl: 11 .  gabapentin (NEURONTIN) 300 MG capsule, Take 1 capsule (300 mg total) by mouth 3 (three) times daily. Take 1-2 tablets up to three times a day for trigeminal neuralgia., Disp: 90 capsule, Rfl: 11 .  lamoTRIgine (LAMICTAL) 200 MG tablet, Take 1 tablet (200 mg total) by mouth 2 (two) times daily., Disp: 60 tablet, Rfl: 11 .  methylPREDNISolone (MEDROL DOSEPAK) 4 MG TBPK tablet, follow package directions, Disp: 21 tablet, Rfl: 2 .  naproxen (NAPROSYN) 500 MG tablet, Take 1 tablet (500 mg total) by mouth 2 (two) times daily with a meal., Disp: 60 tablet, Rfl: 1 .  ondansetron (ZOFRAN-ODT) 4 MG disintegrating tablet, Take 1 tablet (4 mg total) by mouth every 8 (eight) hours as needed for nausea., Disp: 60 tablet, Rfl: 6 .  traZODone (DESYREL) 100 MG tablet, Take 200 mg by mouth at bedtime as needed., Disp: , Rfl:  .  emtricitabine-tenofovir AF (DESCOVY) 200-25 MG tablet, Take 1 tablet by mouth daily., Disp: 30 tablet, Rfl: 11    Review of Systems  Constitutional: Negative for chills, diaphoresis, activity change, fatigue and unexpected weight change.  HENT: Negative for rhinorrhea, sinus pressure, sneezing and sore throat.   Eyes: Negative for photophobia and visual  disturbance.  Respiratory: Negative for chest tightness and wheezing.   Cardiovascular: Negative for chest pain, palpitations and leg swelling.  Gastrointestinal: Negative for blood in stool, abdominal distention and anal bleeding.  Genitourinary: Negative for flank pain and difficulty urinating.  Musculoskeletal: Negative for back pain, joint swelling and gait problem.  Skin: Negative for color change, pallor, rash and wound.  Neurological: Negative for dizziness, tremors, seizures, weakness and light-headedness.  Hematological: Negative for adenopathy. Does not bruise/bleed easily.  Psychiatric/Behavioral: Negative for behavioral problems, confusion, sleep disturbance, dysphoric mood, decreased concentration and agitation.       Objective:   Physical Exam  Constitutional: She is oriented to person, place, and time. She appears well-developed and well-nourished. No distress.  HENT:  Head: Normocephalic and atraumatic.  Mouth/Throat: No oropharyngeal exudate.  Eyes: Conjunctivae and EOM are normal. No scleral icterus.  Neck: Normal range of motion. Neck supple.  Cardiovascular: Normal rate and regular rhythm.   Pulmonary/Chest: Effort normal. No respiratory distress. She has no wheezes.  Abdominal: Soft. She exhibits no distension, no pulsatile liver, no fluid wave and no ascites. There is no rigidity, no guarding, no CVA tenderness, no tenderness at McBurney's point and negative Murphy's sign.  Musculoskeletal: She exhibits no edema or tenderness.  Neurological: She is alert and oriented to person, place, and time. She exhibits normal muscle tone. Coordination normal.  Skin: Skin is warm and dry. No rash noted. She is not diaphoretic. No erythema. No pallor.  Psychiatric: She has a normal mood and affect. Her behavior is normal. Judgment and thought content normal.          Assessment & Plan:  HIV: change to  Tivicay and Descovy and RTC for labs in 2 months and FU visit in 10  weeks. Script sent to [facility]  Opioid maintenance being seen by clinic here in town.  Depression schizoaffective disorder: seeing psychiatry  Seizures: on AEDs and they do not appear to  interact with her TIVICAY  I spent greater than 25 minutes with the patient including greater than 50% of time in face to face counsel of the patient re her HIV, different ARV regimens including the new one  and in coordination of her care.

## 2015-10-05 ENCOUNTER — Encounter: Payer: Medicare Other | Admitting: Obstetrics & Gynecology

## 2015-10-12 ENCOUNTER — Ambulatory Visit: Payer: Medicare Other | Admitting: Infectious Disease

## 2015-10-12 MED FILL — TIVICAY 50 MG TABLET: 50 | 30 days supply | Qty: 30 | Fill #1

## 2015-10-12 MED FILL — DESCOVY 200-25 MG TABS: 200-25 | 30 days supply | Qty: 30 | Fill #1

## 2015-11-10 MED FILL — DESCOVY 200-25 MG TABS: 200-25 | 30 days supply | Qty: 30 | Fill #2

## 2015-11-10 MED FILL — TIVICAY 50 MG TABLET: 50 | 30 days supply | Qty: 30 | Fill #2

## 2015-11-16 ENCOUNTER — Ambulatory Visit (INDEPENDENT_AMBULATORY_CARE_PROVIDER_SITE_OTHER): Payer: Medicare Other | Admitting: Infectious Disease

## 2015-11-16 ENCOUNTER — Encounter: Payer: Self-pay | Admitting: Infectious Disease

## 2015-11-16 ENCOUNTER — Other Ambulatory Visit (HOSPITAL_COMMUNITY)
Admission: RE | Admit: 2015-11-16 | Discharge: 2015-11-16 | Disposition: A | Discharge status: Home / Self Care | Payer: Medicare Other | Source: Ambulatory Visit | Attending: Infectious Disease | Admitting: Infectious Disease

## 2015-11-16 VITALS — BP 132/78 | HR 85 | Temp 98.6°F | Ht 66.0 in | Wt 121.0 lb

## 2015-11-16 DIAGNOSIS — G500 Trigeminal neuralgia: Secondary | ICD-10-CM | POA: Diagnosis not present

## 2015-11-16 DIAGNOSIS — B20 Human immunodeficiency virus [HIV] disease: Secondary | ICD-10-CM | POA: Diagnosis not present

## 2015-11-16 DIAGNOSIS — Z113 Encounter for screening for infections with a predominantly sexual mode of transmission: Secondary | ICD-10-CM | POA: Diagnosis present

## 2015-11-16 DIAGNOSIS — F1990 Other psychoactive substance use, unspecified, uncomplicated: Secondary | ICD-10-CM | POA: Diagnosis not present

## 2015-11-16 DIAGNOSIS — Z87898 Personal history of other specified conditions: Secondary | ICD-10-CM

## 2015-11-16 DIAGNOSIS — F1991 Other psychoactive substance use, unspecified, in remission: Secondary | ICD-10-CM

## 2015-11-16 DIAGNOSIS — R569 Unspecified convulsions: Secondary | ICD-10-CM | POA: Diagnosis not present

## 2015-11-16 LAB — CBC WITH DIFFERENTIAL/PLATELET
Basophils Absolute: 57 cells/uL (ref 0–200)
Basophils Relative: 1 %
Eosinophils Absolute: 228 cells/uL (ref 15–500)
Eosinophils Relative: 4 %
HEMATOCRIT: 40.3 % (ref 35.0–45.0)
Hemoglobin: 13.5 g/dL (ref 11.7–15.5)
LYMPHS PCT: 23 %
Lymphs Abs: 1311 cells/uL (ref 850–3900)
MCH: 31.6 pg (ref 27.0–33.0)
MCHC: 33.5 g/dL (ref 32.0–36.0)
MCV: 94.4 fL (ref 80.0–100.0)
MONO ABS: 342 {cells}/uL (ref 200–950)
MPV: 10.2 fL (ref 7.5–12.5)
Monocytes Relative: 6 %
NEUTROS PCT: 66 %
Neutro Abs: 3762 cells/uL (ref 1500–7800)
Platelets: 174 10*3/uL (ref 140–400)
RBC: 4.27 MIL/uL (ref 3.80–5.10)
RDW: 12.5 % (ref 11.0–15.0)
WBC: 5.7 10*3/uL (ref 3.8–10.8)

## 2015-11-16 LAB — MICROALBUMIN / CREATININE URINE RATIO
Creatinine, Urine: 289 mg/dL (ref 20–320)
MICROALB UR: 1.8 mg/dL
Microalb Creat Ratio: 6 mcg/mg creat (ref <30)

## 2015-11-16 LAB — COMPLETE METABOLIC PANEL WITH GFR
ALBUMIN: 4.5 g/dL (ref 3.6–5.1)
ALT: 8 U/L (ref 6–29)
AST: 12 U/L (ref 10–30)
Alkaline Phosphatase: 61 U/L (ref 33–115)
BUN: 11 mg/dL (ref 7–25)
CALCIUM: 9.4 mg/dL (ref 8.6–10.2)
CHLORIDE: 103 mmol/L (ref 98–110)
CO2: 28 mmol/L (ref 20–31)
CREATININE: 0.72 mg/dL (ref 0.50–1.10)
GFR, Est African American: >89 mL/min (ref >=60)
GFR, Est Non African American: >89 mL/min (ref >=60)
Glucose, Bld: 108 mg/dL — ABNORMAL HIGH (ref 65–99)
Potassium: 5.1 mmol/L (ref 3.5–5.3)
Sodium: 143 mmol/L (ref 135–146)
TOTAL PROTEIN: 7 g/dL (ref 6.1–8.1)
Total Bilirubin: 0.3 mg/dL (ref 0.2–1.2)

## 2015-11-16 MED ORDER — MINOCYCLINE HCL 100 MG PO CAPS: 100.0000 mg | ORAL_CAPSULE | Freq: Two times a day (BID) | ORAL | Status: DC

## 2015-11-16 NOTE — Progress Notes (Signed)
Chief complaint: followup for HIV on medications  Subjective:    Patient ID: [name], female    DOB: 12/07/1980, 35 y.o.   MRN: [id number]  HPI   35 year old Caucasian lady with HIV infection relatively recently diagnosed and started on anti-retroviral indications in the form of TIVICAY and Epivir via the AIDS clinical trial group and now Tivicay and Descovy.  Since changing to this regimen she has found that she has been suffering from acne on her face and back for which she is taking topical clindamycin.  She is seeing PCP for treatment of opiate addiction for depression and primary care.   No recent seizures.  No issues with compliance. NO drug relapse,   Lab Results  Component Value Date   CD4TABS 983 08/13/2015   CD4TABS 452 02/27/2015   CD4TABS 1017 12/11/2014         Past Medical History  Diagnosis Date  . HIV infection (HCC) 2016  . Anxiety   . Arthritis 2000    Right wrist/hand, prior fracture  . Chronic headaches 2005    Migraines  . Depression 2001  . Gallstones   . Pneumonia   . Seizures (HCC)   . 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 Sleep apnea   . PAC (premature atrial contraction) 06/24/2015    Past Surgical History  Procedure Laterality Date  . Cholecystectomy    . Fracture surgery      2-right hand  . Ureter surgery      Placement and removal   . Rotator cuff repair Right 2000    repair frozen shoulder 2001    Family History  Problem Relation Age of Onset  . Diabetes Maternal Grandfather   . Heart disease Maternal Grandfather   . Seizures Neg Hx   . Breast cancer Mother       Social History   Social History  . Marital Status: Legally Separated    Spouse Name: [name]  . Number of Children: 2  . Years of Education: 12+   Occupational History  . Unemployed    Social History Main Topics  . Smoking status: Current Every Day Smoker -- 1.00 packs/day for 0 years    Types:  Cigarettes  . Smokeless tobacco: Never Used     Comment: would like to quit/Rx for nicorrette gum  . Alcohol Use: No  . Drug Use: 7.00 per week    Special: Marijuana     Comment: subutex  . Sexual Activity:    Partners: Male     Comment: declined condoms   Other Topics Concern  . None   Social History Narrative   Lives at home with kids   Caffeine use: Drinks coffee (1 cup per day)    Allergies  Allergen Reactions  . Robinul [Glycopyrrolate] Other (See Comments)    Urinary retention     Current outpatient prescriptions:  .  baclofen (LIORESAL) 10 MG tablet, Take 1 tablet (10 mg total) by mouth 3 (three) times daily., Disp: 30 each, Rfl: 11 .  buprenorphine (SUBUTEX) 8 MG SUBL SL tablet, Place 8 mg under the tongue 3 (three) times daily. , Disp: , Rfl:  .  dolutegravir (TIVICAY) 50 MG tablet, Take 1 tablet (50 mg total) by mouth daily., Disp: 30 tablet, Rfl: 11 .  emtricitabine-tenofovir AF (DESCOVY) 200-25 MG tablet, Take 1 tablet by mouth daily., Disp: 30 tablet, Rfl: 11 .  gabapentin (NEURONTIN)  300 MG capsule, Take 1 capsule (300 mg total) by mouth 3 (three) times daily. Take 1-2 tablets up to three times a day for trigeminal neuralgia., Disp: 90 capsule, Rfl: 11 .  lamoTRIgine (LAMICTAL) 200 MG tablet, Take 1 tablet (200 mg total) by mouth 2 (two) times daily., Disp: 60 tablet, Rfl: 11 .  naproxen (NAPROSYN) 500 MG tablet, Take 1 tablet (500 mg total) by mouth 2 (two) times daily with a meal., Disp: 60 tablet, Rfl: 1 .  ondansetron (ZOFRAN-ODT) 4 MG disintegrating tablet, Take 1 tablet (4 mg total) by mouth every 8 (eight) hours as needed for nausea., Disp: 60 tablet, Rfl: 6 .  traZODone (DESYREL) 100 MG tablet, Take 200 mg by mouth at bedtime as needed., Disp: , Rfl:     Review of Systems  Constitutional: Negative for chills, diaphoresis, activity change, fatigue and unexpected weight change.  HENT: Negative for rhinorrhea, sinus pressure, sneezing and sore throat.     Eyes: Negative for photophobia and visual disturbance.  Respiratory: Negative for chest tightness and wheezing.   Cardiovascular: Negative for chest pain, palpitations and leg swelling.  Gastrointestinal: Negative for blood in stool, abdominal distention and anal bleeding.  Genitourinary: Negative for flank pain and difficulty urinating.  Musculoskeletal: Negative for back pain, joint swelling and gait problem.  Skin: Negative for color change, pallor, rash and wound.  Neurological: Negative for dizziness, tremors, seizures, weakness and light-headedness.  Hematological: Negative for adenopathy. Does not bruise/bleed easily.  Psychiatric/Behavioral: Negative for behavioral problems, confusion, sleep disturbance, dysphoric mood, decreased concentration and agitation.       Objective:   Physical Exam  Constitutional: She is oriented to person, place, and time. She appears well-developed and well-nourished. No distress.  HENT:  Head: Normocephalic and atraumatic.  Mouth/Throat: No oropharyngeal exudate.  Eyes: Conjunctivae and EOM are normal. No scleral icterus.  Neck: Normal range of motion. Neck supple.  Cardiovascular: Normal rate and regular rhythm.   Pulmonary/Chest: Effort normal. No respiratory distress. She has no wheezes.  Abdominal: Soft. She exhibits no distension, no pulsatile liver, no fluid wave and no ascites. There is no rigidity, no guarding, no CVA tenderness, no tenderness at McBurney's point and negative Murphy's sign.  Musculoskeletal: She exhibits no edema or tenderness.  Neurological: She is alert and oriented to person, place, and time. She exhibits normal muscle tone. Coordination normal.  Skin: Skin is warm and dry. No rash noted. She is not diaphoretic. No erythema. No pallor.  Psychiatric: She has a normal mood and affect. Her behavior is normal. Judgment and thought content normal.          Assessment & Plan:  [id number]   Tivicay and Descovy  And  check labs today, RTC in 4 months  Opioid maintenance being seen by clinic here in town.  Depression schizoaffective disorder: seeing psychology and primary care  Seizures: on AEDs and they do not appear to  interact with her TIVICAY  I spent greater than 25 minutes with the patient including greater than 50% of time in face to face counsel of the patient re her HIV, prior IVDU, depression, seizures   and in coordination of her care.

## 2015-11-17 LAB — URINE CYTOLOGY ANCILLARY ONLY
Chlamydia: NEGATIVE
Neisseria Gonorrhea: NEGATIVE

## 2015-11-17 LAB — T-HELPER CELL (CD4) - (RCID CLINIC ONLY)
CD4 % Helper T Cell: 44 % (ref 33–55)
CD4 T CELL ABS: 600 /uL (ref 400–2700)

## 2015-11-17 LAB — RPR

## 2015-11-20 LAB — HIV RNA, RTPCR W/R GT (RTI, PI,INT): HIV 1 RNA Quant: <20 copies/mL

## 2015-11-26 ENCOUNTER — Encounter: Payer: Self-pay | Admitting: Neurology

## 2015-11-26 ENCOUNTER — Ambulatory Visit (INDEPENDENT_AMBULATORY_CARE_PROVIDER_SITE_OTHER): Payer: Medicare Other | Admitting: Neurology

## 2015-11-26 VITALS — BP 102/71 | HR 55 | Ht 66.0 in | Wt 121.6 lb

## 2015-11-26 DIAGNOSIS — G40009 Localization-related (focal) (partial) idiopathic epilepsy and epileptic syndromes with seizures of localized onset, not intractable, without status epilepticus: Secondary | ICD-10-CM | POA: Diagnosis not present

## 2015-11-26 DIAGNOSIS — R569 Unspecified convulsions: Secondary | ICD-10-CM | POA: Diagnosis not present

## 2015-11-26 DIAGNOSIS — R1013 Epigastric pain: Secondary | ICD-10-CM | POA: Diagnosis not present

## 2015-11-26 DIAGNOSIS — G5710 Meralgia paresthetica, unspecified lower limb: Secondary | ICD-10-CM

## 2015-11-26 DIAGNOSIS — R110 Nausea: Secondary | ICD-10-CM | POA: Diagnosis not present

## 2015-11-26 MED ORDER — ONDANSETRON 4 MG PO TBDP: 4.0000 mg | ORAL_TABLET | Freq: Three times a day (TID) | ORAL | Status: DC | PRN

## 2015-11-26 MED ORDER — BACLOFEN 10 MG PO TABS: 10.0000 mg | ORAL_TABLET | Freq: Three times a day (TID) | ORAL | Status: DC

## 2015-11-26 MED ORDER — GABAPENTIN 300 MG PO CAPS: 300.0000 mg | ORAL_CAPSULE | Freq: Three times a day (TID) | ORAL | Status: DC

## 2015-11-26 MED ORDER — LAMOTRIGINE 200 MG PO TABS: 200.0000 mg | ORAL_TABLET | Freq: Two times a day (BID) | ORAL | Status: DC

## 2015-11-26 NOTE — Patient Instructions (Signed)
Remember to drink plenty of fluid, eat healthy meals and do not skip any meals. Try to eat protein with a every meal and eat a healthy snack such as fruit or nuts in between meals. Try to keep a regular sleep-wake schedule and try to exercise daily, particularly in the form of walking, 20-30 minutes a day, if you can.   As far as your medications are concerned, I would like to suggest: Continue medications  I would like to see you back in 4-6 months, sooner if we need to. Please call us with any interim questions, concerns, problems, updates or refill requests.   Our phone number is [phone]. We also have an after hours call service for urgent matters and there is a physician on-call for urgent questions. For any emergencies you know to call 911 or go to the nearest emergency room
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
## 2015-12-09 MED FILL — DESCOVY 200-25 MG TABS: 200-25 | 30 days supply | Qty: 30 | Fill #3

## 2015-12-09 MED FILL — TIVICAY 50 MG TABLET: 50 | 30 days supply | Qty: 30 | Fill #3

## 2015-12-22 ENCOUNTER — Telehealth: Payer: Self-pay | Admitting: Neurology

## 2015-12-22 NOTE — Telephone Encounter (Signed)
Patient called to inquire if Dr. Lucia Gaskins is back in the office, requested to speak with Dr. Lucia Gaskins, has something she wants to show Dr. Lucia Gaskins. Please call [phone].

## 2015-12-24 ENCOUNTER — Telehealth: Payer: Self-pay | Admitting: Neurology

## 2015-12-24 NOTE — Telephone Encounter (Signed)
Dr Lucia Gaskins- FYI Called pt back. She stated she is still having the "back and forth motion" that she has at her last office visit with Dr Lucia Gaskins. This is not a new symptom. When I asked pt if she has seen an eye doctor as recommended by Dr Lucia Gaskins, she denied and stated she has not been yet. I recommended she call her insurance company to see who is in network for her and then if she needs a referral, she can contact her PCP for this. I will relay message to Dr Lucia Gaskins and call back if Dr Lucia Gaskins wants to do anything more. She verbalized understanding.

## 2015-12-24 NOTE — Telephone Encounter (Signed)
Patient called to advise, "eyes keep doing this weird thing, it's the only way I know how to describe it".

## 2015-12-24 NOTE — Telephone Encounter (Signed)
Called patient, thanks.

## 2016-01-07 MED FILL — DESCOVY 200-25 MG TABS: 200-25 | 30 days supply | Qty: 30 | Fill #4

## 2016-01-07 MED FILL — TIVICAY 50 MG TABLET: 50 | 30 days supply | Qty: 30 | Fill #4

## 2016-01-14 ENCOUNTER — Other Ambulatory Visit: Payer: Medicare Other

## 2016-01-14 ENCOUNTER — Other Ambulatory Visit (HOSPITAL_COMMUNITY)
Admission: RE | Admit: 2016-01-14 | Discharge: 2016-01-14 | Disposition: A | Discharge status: Home / Self Care | Payer: Medicare Other | Source: Ambulatory Visit | Attending: Infectious Disease | Admitting: Infectious Disease

## 2016-01-14 DIAGNOSIS — B20 Human immunodeficiency virus [HIV] disease: Secondary | ICD-10-CM

## 2016-01-14 DIAGNOSIS — Z113 Encounter for screening for infections with a predominantly sexual mode of transmission: Secondary | ICD-10-CM | POA: Diagnosis present

## 2016-01-14 LAB — CBC WITH DIFFERENTIAL/PLATELET
BASOS PCT: 1 %
Basophils Absolute: 42 cells/uL (ref 0–200)
EOS ABS: 294 {cells}/uL (ref 15–500)
Eosinophils Relative: 7 %
HCT: 39.2 % (ref 35.0–45.0)
Hemoglobin: 13.1 g/dL (ref 11.7–15.5)
Lymphocytes Relative: 45 %
Lymphs Abs: 1890 cells/uL (ref 850–3900)
MCH: 31.3 pg (ref 27.0–33.0)
MCHC: 33.4 g/dL (ref 32.0–36.0)
MCV: 93.8 fL (ref 80.0–100.0)
MONOS PCT: 5 %
MPV: 8.9 fL (ref 7.5–12.5)
Monocytes Absolute: 210 cells/uL (ref 200–950)
NEUTROS ABS: 1764 {cells}/uL (ref 1500–7800)
Neutrophils Relative %: 42 %
PLATELETS: 209 10*3/uL (ref 140–400)
RBC: 4.18 MIL/uL (ref 3.80–5.10)
RDW: 13.6 % (ref 11.0–15.0)
WBC: 4.2 10*3/uL (ref 3.8–10.8)

## 2016-01-14 LAB — COMPLETE METABOLIC PANEL WITH GFR
ALT: 11 U/L (ref 6–29)
AST: 15 U/L (ref 10–30)
Albumin: 4 g/dL (ref 3.6–5.1)
Alkaline Phosphatase: 57 U/L (ref 33–115)
BILIRUBIN TOTAL: 0.6 mg/dL (ref 0.2–1.2)
BUN: 7 mg/dL (ref 7–25)
CHLORIDE: 107 mmol/L (ref 98–110)
CO2: 22 mmol/L (ref 20–31)
Calcium: 9.1 mg/dL (ref 8.6–10.2)
Creat: 0.81 mg/dL (ref 0.50–1.10)
Glucose, Bld: 112 mg/dL — ABNORMAL HIGH (ref 65–99)
Potassium: 4.7 mmol/L (ref 3.5–5.3)
Sodium: 140 mmol/L (ref 135–146)
TOTAL PROTEIN: 6.4 g/dL (ref 6.1–8.1)

## 2016-01-15 LAB — T-HELPER CELL (CD4) - (RCID CLINIC ONLY)
CD4 % Helper T Cell: 36 % (ref 33–55)
CD4 T CELL ABS: 710 /uL (ref 400–2700)

## 2016-01-15 LAB — RPR

## 2016-01-15 LAB — URINE CYTOLOGY ANCILLARY ONLY
Chlamydia: NEGATIVE
NEISSERIA GONORRHEA: NEGATIVE

## 2016-01-18 LAB — HIV-1 RNA QUANT-NO REFLEX-BLD: HIV 1 RNA Quant: <20 copies/mL (ref <20)

## 2016-01-28 ENCOUNTER — Ambulatory Visit (INDEPENDENT_AMBULATORY_CARE_PROVIDER_SITE_OTHER): Payer: Medicare Other | Admitting: Infectious Disease

## 2016-01-28 ENCOUNTER — Encounter: Payer: Self-pay | Admitting: Infectious Disease

## 2016-01-28 VITALS — BP 136/92 | HR 89 | Temp 99.0°F | Ht 66.0 in | Wt 116.8 lb

## 2016-01-28 DIAGNOSIS — Z23 Encounter for immunization: Secondary | ICD-10-CM

## 2016-01-28 DIAGNOSIS — R202 Paresthesia of skin: Secondary | ICD-10-CM | POA: Diagnosis not present

## 2016-01-28 DIAGNOSIS — M1990 Unspecified osteoarthritis, unspecified site: Secondary | ICD-10-CM

## 2016-01-28 DIAGNOSIS — Z77120 Contact with and (suspected) exposure to mold (toxic): Secondary | ICD-10-CM

## 2016-01-28 DIAGNOSIS — R112 Nausea with vomiting, unspecified: Secondary | ICD-10-CM | POA: Insufficient documentation

## 2016-01-28 DIAGNOSIS — R1011 Right upper quadrant pain: Secondary | ICD-10-CM

## 2016-01-28 DIAGNOSIS — B20 Human immunodeficiency virus [HIV] disease: Secondary | ICD-10-CM | POA: Diagnosis not present

## 2016-01-28 DIAGNOSIS — F1990 Other psychoactive substance use, unspecified, uncomplicated: Secondary | ICD-10-CM | POA: Diagnosis not present

## 2016-01-28 DIAGNOSIS — R1010 Upper abdominal pain, unspecified: Secondary | ICD-10-CM | POA: Diagnosis not present

## 2016-01-28 DIAGNOSIS — R569 Unspecified convulsions: Secondary | ICD-10-CM | POA: Diagnosis not present

## 2016-01-28 DIAGNOSIS — F450 Somatization disorder: Secondary | ICD-10-CM | POA: Diagnosis not present

## 2016-01-28 DIAGNOSIS — Z87898 Personal history of other specified conditions: Secondary | ICD-10-CM

## 2016-01-28 DIAGNOSIS — F1991 Other psychoactive substance use, unspecified, in remission: Secondary | ICD-10-CM

## 2016-01-28 DIAGNOSIS — R1012 Left upper quadrant pain: Secondary | ICD-10-CM

## 2016-01-28 NOTE — Progress Notes (Signed)
Chief complaint: concern that she is being exposed to a mold in her house that is causing her to have mx symptoms she has suffered over the past year including daily vomiting, flank pain   Subjective:    Patient ID: [name], female    DOB: 11/06/1980, 35 y.o.   MRN: [id number]  HPI  35 year old Caucasian lady with HIV infection relatively recently diagnosed and started on anti-retroviral indications in the form of TIVICAY and Epivir via the AIDS clinical trial group and now Tivicay and Descovy.  Lab Results  Component Value Date   HIV1RNAQUANT <20 01/14/2016   HIV1RNAQUANT <20 11/16/2015   HIV1RNAQUANT 52,510 (H) 08/11/2014     Lab Results  Component Value Date   CD4TABS 710 01/14/2016   CD4TABS 600 11/16/2015   CD4TABS 983 08/13/2015     She has mx symptoms that she believes might be due to mold exposure in her house including abdominal pain, her urinary retention at one point, daily vomiting.    I suggested that depression might be playing a role in some of these symptoms and that the urinary retention could have been due to opiates.   She seems unconvinced by my assessment but I do not have a test to offer her for "exposure to molds" that would explain her symptoms. She states that her IVDU has not relapsed and she is in opiate program. She is seeing a counselor for depression.    Past Medical History:  Diagnosis Date  . Anxiety   . Arthritis 2000   Right wrist/hand, prior fracture  . Chronic headaches 2005   Migraines  . Depression 2001  . Gallstones   . 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 HIV infection (HCC) 2016  . PAC (premature atrial contraction) 06/24/2015  . Pneumonia   . Seizures (HCC)   . Sleep apnea     Past Surgical History:  Procedure Laterality Date  . CHOLECYSTECTOMY    . FRACTURE SURGERY     2-right hand  . ROTATOR CUFF REPAIR Right 2000   repair frozen shoulder 2001  . URETER  SURGERY     Placement and removal     Family History  Problem Relation Age of Onset  . Diabetes Maternal Grandfather   . Heart disease Maternal Grandfather   . Seizures Neg Hx   . Breast cancer Mother       Social History   Social History  . Marital status: Legally Separated    Spouse name: [name]  . Number of children: 2  . Years of education: 12+   Occupational History  . Unemployed    Social History Main Topics  . Smoking status: Current Every Day Smoker    Packs/day: 0.80    Years: 24.00    Types: Cigarettes  . Smokeless tobacco: Never Used     Comment: would like to quit/Rx for nicorrette gum  . Alcohol use No  . Drug use:     Frequency: 7.0 times per week    Types: Marijuana     Comment: subutex  . Sexual activity: Not Currently    Partners: Male     Comment: declined condoms   Other Topics Concern  . None   Social History Narrative   Lives at home with kids   Caffeine use: Drinks coffee (1 cup per day)    Allergies  Allergen Reactions  . Robinul [Glycopyrrolate] Other (See Comments)  Urinary retention     Current Outpatient Prescriptions:  .  baclofen (LIORESAL) 10 MG tablet, Take 1 tablet (10 mg total) by mouth 3 (three) times daily., Disp: 30 each, Rfl: 11 .  buprenorphine (SUBUTEX) 8 MG SUBL SL tablet, Place 8 mg under the tongue 3 (three) times daily. , Disp: , Rfl:  .  dolutegravir (TIVICAY) 50 MG tablet, Take 1 tablet (50 mg total) by mouth daily., Disp: 30 tablet, Rfl: 11 .  emtricitabine-tenofovir AF (DESCOVY) 200-25 MG tablet, Take 1 tablet by mouth daily., Disp: 30 tablet, Rfl: 11 .  gabapentin (NEURONTIN) 300 MG capsule, Take 1 capsule (300 mg total) by mouth 3 (three) times daily. Take 1-2 tablets up to three times a day for trigeminal neuralgia., Disp: 90 capsule, Rfl: 11 .  lamoTRIgine (LAMICTAL) 200 MG tablet, Take 1 tablet (200 mg total) by mouth 2 (two) times daily., Disp: 60 tablet, Rfl: 11 .  minocycline (MINOCIN) 100 MG capsule,  Take 1 capsule (100 mg total) by mouth 2 (two) times daily., Disp: 60 capsule, Rfl: 3 .  naproxen (NAPROSYN) 500 MG tablet, Take 1 tablet (500 mg total) by mouth 2 (two) times daily with a meal., Disp: 60 tablet, Rfl: 1 .  ondansetron (ZOFRAN-ODT) 4 MG disintegrating tablet, Take 1 tablet (4 mg total) by mouth every 8 (eight) hours as needed for nausea., Disp: 60 tablet, Rfl: 11 .  traZODone (DESYREL) 100 MG tablet, Take 200 mg by mouth at bedtime as needed., Disp: , Rfl:     Review of Systems  Constitutional: Negative for activity change, chills, diaphoresis, fatigue and unexpected weight change.  HENT: Negative for rhinorrhea, sinus pressure, sneezing and sore throat.   Eyes: Negative for photophobia and visual disturbance.  Respiratory: Negative for chest tightness and wheezing.   Cardiovascular: Negative for chest pain, palpitations and leg swelling.  Gastrointestinal: Positive for abdominal distention and vomiting. Negative for anal bleeding and blood in stool.  Genitourinary: Negative for difficulty urinating and flank pain.  Musculoskeletal: Negative for back pain, gait problem and joint swelling.  Skin: Negative for color change, pallor, rash and wound.  Neurological: Negative for dizziness, tremors, seizures, weakness and light-headedness.  Hematological: Negative for adenopathy. Does not bruise/bleed easily.  Psychiatric/Behavioral: Positive for dysphoric mood. Negative for agitation, behavioral problems, confusion, decreased concentration and sleep disturbance.       Objective:   Physical Exam  Constitutional: She is oriented to person, place, and time. She appears well-developed and well-nourished. No distress.  HENT:  Head: Normocephalic and atraumatic.  Mouth/Throat: No oropharyngeal exudate.  Eyes: Conjunctivae and EOM are normal. No scleral icterus.  Neck: Normal range of motion. Neck supple.  Cardiovascular: Normal rate and regular rhythm.   Pulmonary/Chest: Effort  normal. No respiratory distress. She has no wheezes.  Abdominal: Soft. She exhibits no distension, no pulsatile liver, no fluid wave and no ascites. There is no rigidity, no guarding, no CVA tenderness, no tenderness at McBurney's point and negative Murphy's sign.  Musculoskeletal: She exhibits no edema or tenderness.  Neurological: She is alert and oriented to person, place, and time. She exhibits normal muscle tone. Coordination normal.  Skin: Skin is warm and dry. No rash noted. She is not diaphoretic. No erythema. No pallor.  Psychiatric: She has a normal mood and affect. Her behavior is normal. Judgment and thought content normal.          Assessment & Plan:  RUE:[id number]   Tivicay and Descovy  Perfect control  Opioid maintenance being  seen by clinic here in town.  Depression schizoaffective disorder: seeing psychology and primary care  Seizures: on AEDs and they do not appear to  interact with her TIVICAY  Vomiting: I suspect that this is related to depressive ssx  Mx physical complaints without underlying organic pathology found: I suspect that there is a degree of somatization going on here  I spent greater than 40 minutes with the patient including greater than 50% of time in face to face counsel of the patient re her HIV, prior IVDU, depression, seizures mx symptoms including her vomiting, mold exposure   and in coordination of her care.

## 2016-02-05 MED FILL — DESCOVY 200-25 MG TABS: 200-25 | 30 days supply | Qty: 30 | Fill #5

## 2016-02-05 MED FILL — TIVICAY 50 MG TABLET: 50 | 30 days supply | Qty: 30 | Fill #5

## 2016-02-17 ENCOUNTER — Other Ambulatory Visit: Payer: Medicare Other

## 2016-02-17 ENCOUNTER — Ambulatory Visit: Payer: Medicare Other | Admitting: Infectious Disease

## 2016-03-07 MED FILL — DESCOVY 200-25 MG TABS: 200-25 | 30 days supply | Qty: 30 | Fill #6

## 2016-03-07 MED FILL — TIVICAY 50 MG TABLET: 50 | 30 days supply | Qty: 30 | Fill #6

## 2016-03-08 ENCOUNTER — Other Ambulatory Visit: Payer: Self-pay | Admitting: Infectious Disease

## 2016-03-11 IMAGING — RF DG ESOPHAGUS
11 series · 19 of 24 positions shown · non-contrast
Comparison: 10/08/2014

CLINICAL DATA: Epigastric pain and dysphagia. Seizures. Left upper
quadrant abdominal pain immediately with meals.

EXAM:
ESOPHOGRAM / BARIUM SWALLOW / BARIUM TABLET STUDY
TECHNIQUE: Combined double contrast and single contrast examination performed
using effervescent crystals, thick barium liquid, and thin barium
liquid. The patient was observed with fluoroscopy swallowing a 13 mm
barium sulphate tablet.
FLUOROSCOPY TIME:  Radiation Exposure Index (as provided by the
fluoroscopic device): 120 microGy*m^2
If the device does not provide the exposure index:
Fluoroscopy Time:  dictate in minutes and seconds
Number of Acquired Images:

[Series 1: cp_standard · 0.34mm/px · 2 of 90 frames shown (1 of 11)]
[frame 9/90]
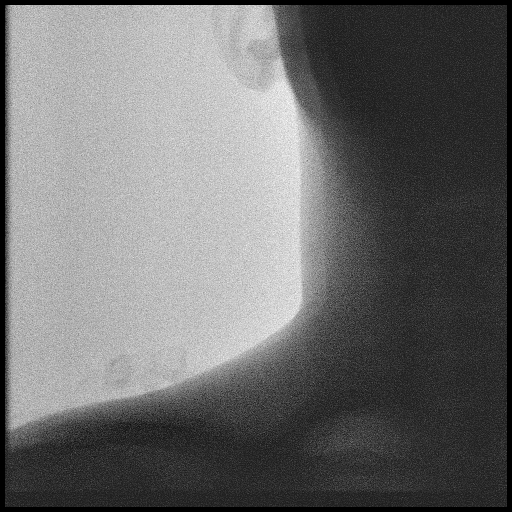
[frame 46/90]
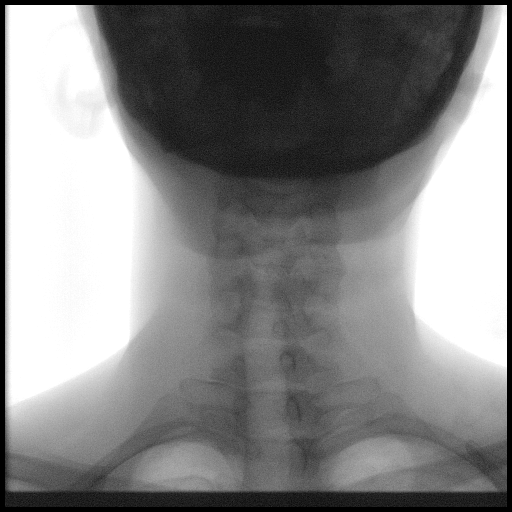

[Series 2: cp_standard · 0.34mm/px · 2 of 50 frames shown (2 of 11)]
[frame 8/50]
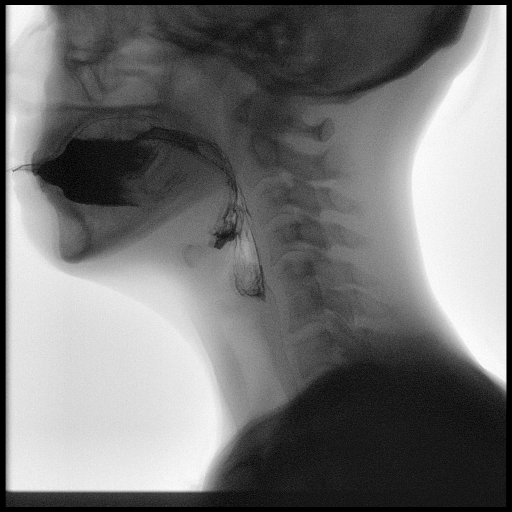
[frame 43/50]
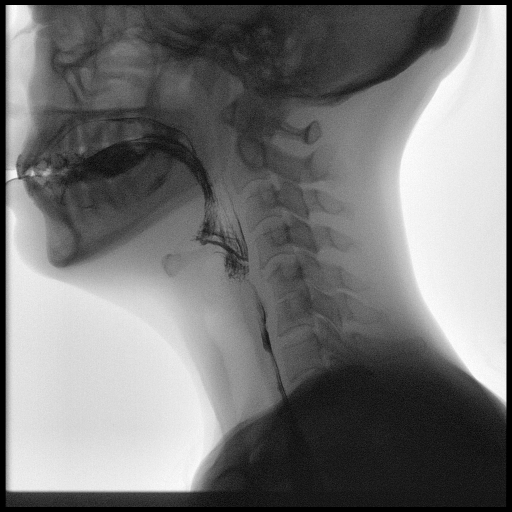

[Series 3: cp_standard · 0.34mm/px · 2 of 113 frames shown (3 of 11)]
[frame 17/113]
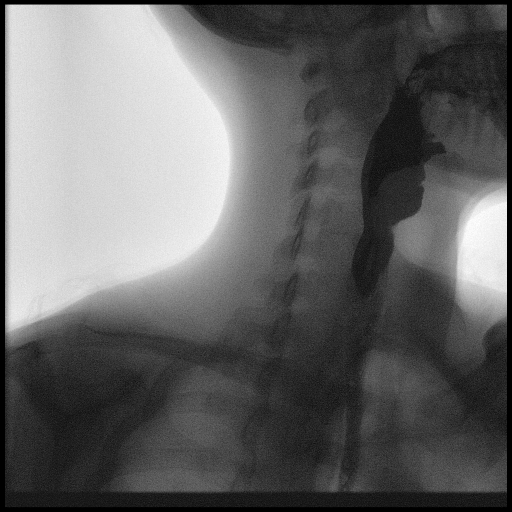
[frame 97/113]
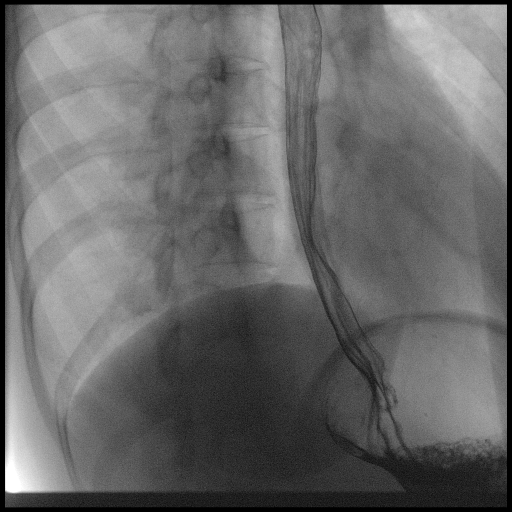

[Series 4: cp_standard · 0.34mm/px · 1 of 14 frames shown (4 of 11)]
[frame 12/14]
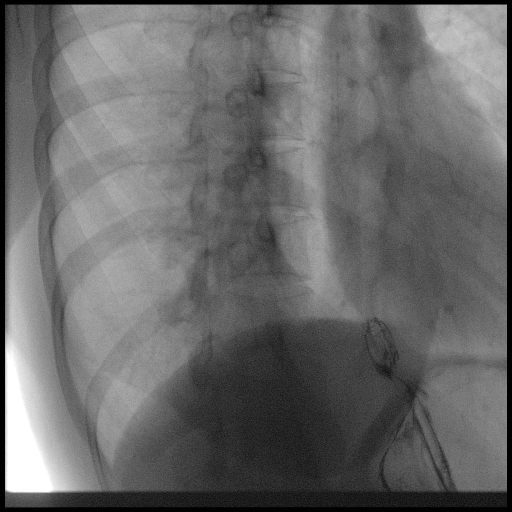

[Series 5: cp_standard · 0.38mm/px · 2 of 23 frames shown (5 of 11)]
[frame 4/23]
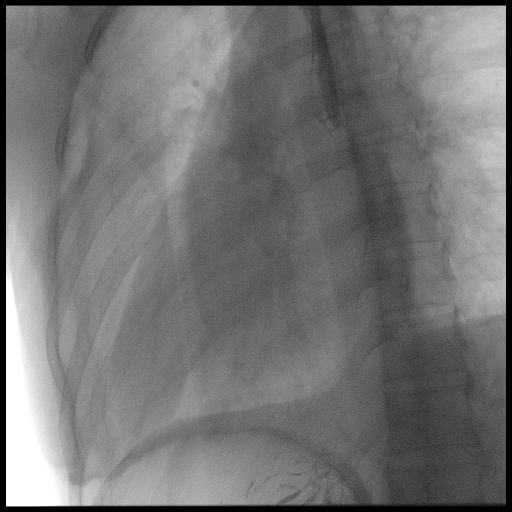
[frame 20/23]
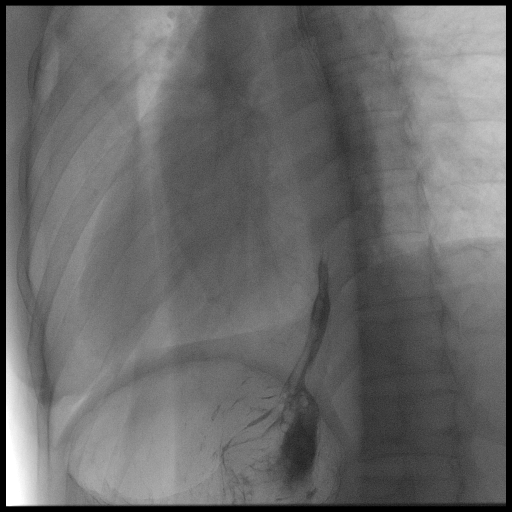

[Series 6: cp_standard · 0.38mm/px · 1 of 31 frames shown (6 of 11)]
[frame 27/31]
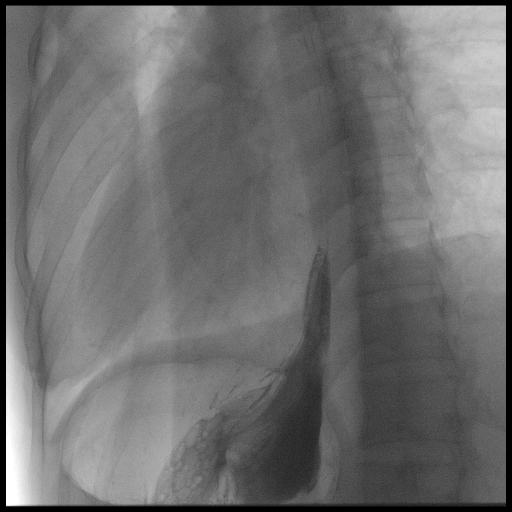

[Series 7: cp_standard · 0.38mm/px · 3 of 41 frames shown (7 of 11)]
[frame 7/41]
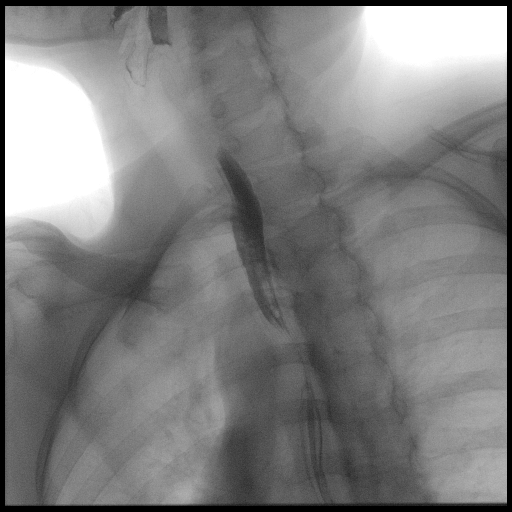
[frame 35/41]
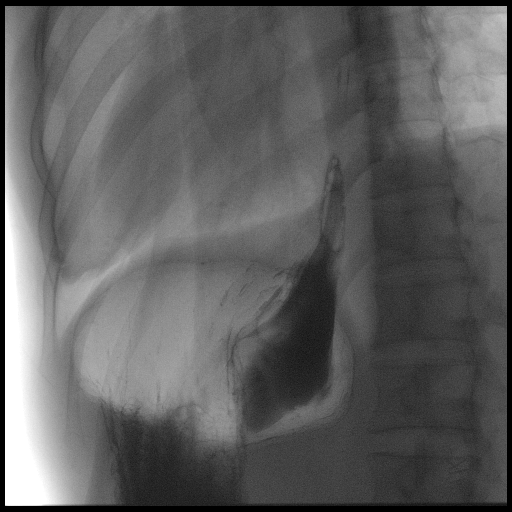
[frame 40/41]
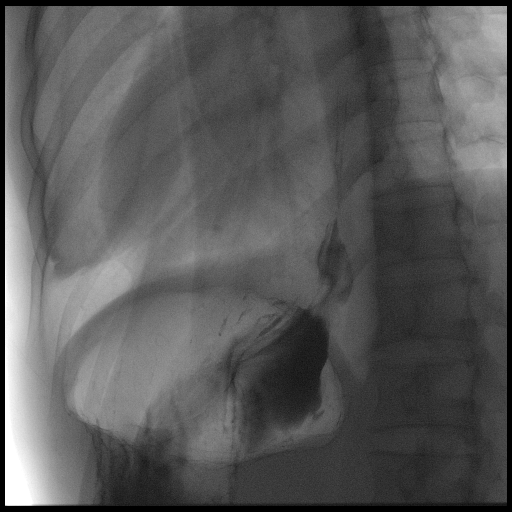

[Series 8: cp_standard · 0.38mm/px · 1 of 15 frames shown (8 of 11)]
[frame 13/15]
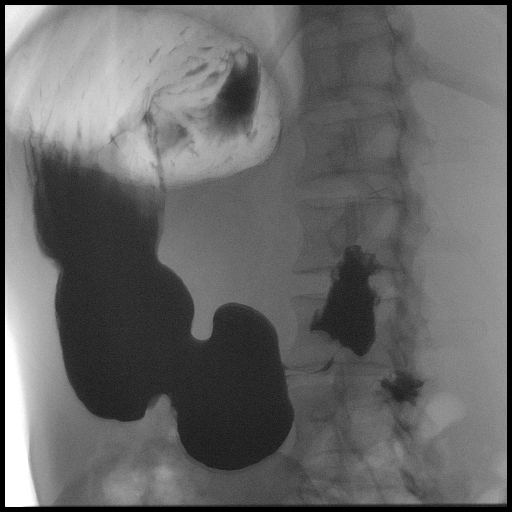

[Series 9: cp_standard · 0.38mm/px · 2 of 31 frames shown (9 of 11)]
[frame 16/31]
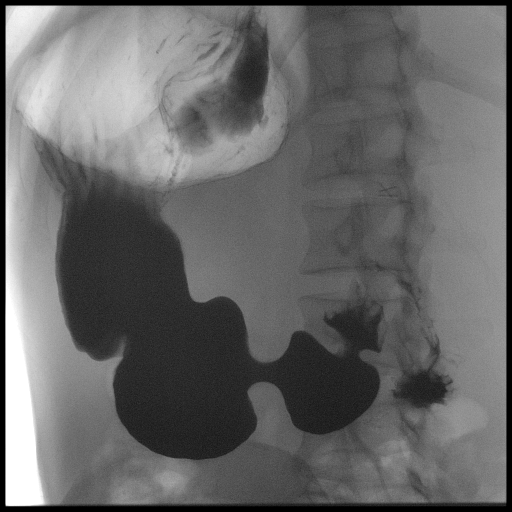
[frame 27/31]
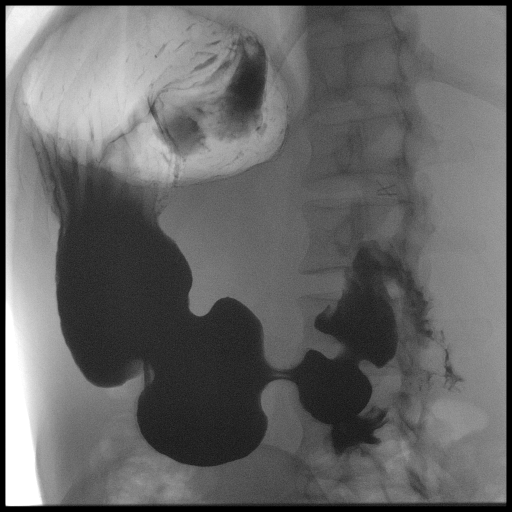

[Series 10: cp_standard · 0.38mm/px · 1 of 34 frames shown (10 of 11)]
[frame 18/34]
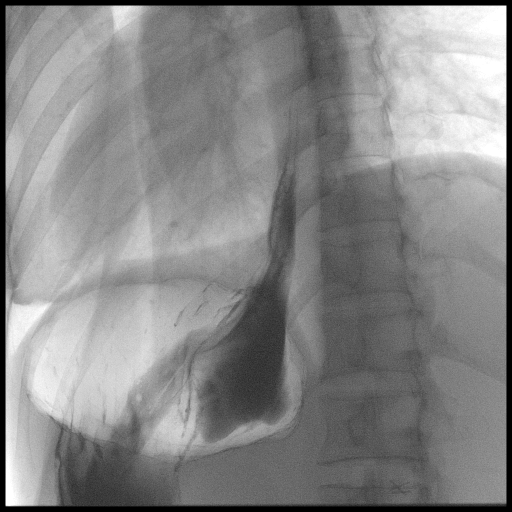

[Series 11: cp_standard · 0.38mm/px · 2 of 6 frames shown (11 of 11)]
[frame 1/6]
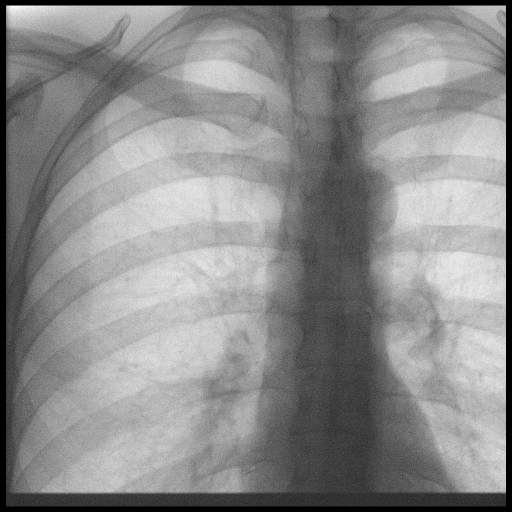
[frame 6/6]
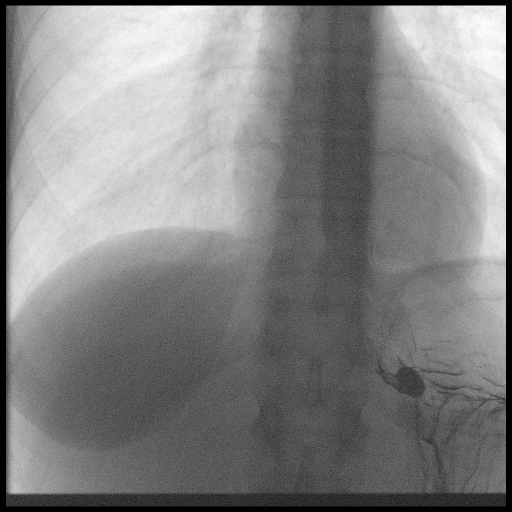

[19 of 24 positions shown; findings below may reference images not displayed]

FINDINGS: The pharyngeal phase of swallowing appears normal. No significant
mucosal irregularity in the esophagus on double contrast evaluation.
No significant distal esophageal fold thickening. Small type 1
hiatal hernia noted.

Upon individual swallows, primary peristaltic waves in the esophagus
were normal on [DATE] swallows. No focal narrowing of the esophagus.
Possible fold irregularity in the duodenum (images 6 through 31 of
series 9) although this was not assessed in detail. Minimal
gastroesophageal reflux.

A 13 mm barium tablet passed without difficulty into the stomach.
IMPRESSION: 1. Potentially mild fold irregularity in the duodenum. Today's exam
was not esophagram and not a dedicated upper GI study, and this was
only noted incidentally. The possibility of sprue is not ruled [DATE]. Small type 1 hiatal hernia.
3. Normal primary peristaltic waves in the esophagus. Minimal
gastroesophageal reflux.

## 2016-03-28 ENCOUNTER — Ambulatory Visit (INDEPENDENT_AMBULATORY_CARE_PROVIDER_SITE_OTHER): Payer: Medicare Other | Admitting: Neurology

## 2016-03-28 ENCOUNTER — Encounter: Payer: Self-pay | Admitting: Neurology

## 2016-03-28 VITALS — BP 107/74 | HR 92 | Ht 66.0 in | Wt 126.6 lb

## 2016-03-28 DIAGNOSIS — G40009 Localization-related (focal) (partial) idiopathic epilepsy and epileptic syndromes with seizures of localized onset, not intractable, without status epilepticus: Secondary | ICD-10-CM | POA: Diagnosis not present

## 2016-03-28 DIAGNOSIS — M792 Neuralgia and neuritis, unspecified: Secondary | ICD-10-CM

## 2016-03-28 DIAGNOSIS — R209 Unspecified disturbances of skin sensation: Secondary | ICD-10-CM | POA: Diagnosis not present

## 2016-03-28 DIAGNOSIS — G500 Trigeminal neuralgia: Secondary | ICD-10-CM | POA: Diagnosis not present
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
## 2016-03-28 NOTE — Patient Instructions (Signed)
Remember to drink plenty of fluid, eat healthy meals and do not skip any meals. Try to eat protein with a every meal and eat a healthy snack such as fruit or nuts in between meals. Try to keep a regular sleep-wake schedule and try to exercise daily, particularly in the form of walking, 20-30 minutes a day, if you can.   As far as your medications are concerned, I would like to suggest: Continue current medications  As far as diagnostic testing: MRI with trigeminal protocol  I would like to see you back in 4-6 months, sooner if we need to. Please call us with any interim questions, concerns, problems, updates or refill requests.   Our phone number is [phone]. We also have an after hours call service for urgent matters and there is a physician on-call for urgent questions. For any emergencies you know to call 911 or go to the nearest emergency room

## 2016-03-29 ENCOUNTER — Telehealth: Payer: Self-pay | Admitting: *Deleted

## 2016-03-29 NOTE — Telephone Encounter (Signed)
Received fax request asking for refill of minocycline 100 mg #60 - 90 day supply.  Prescription originally written 6/19. Please advise if refills are authorized, and under which diagnosis. Andree CossHowell, Loisann Roach M, RN

## 2016-03-29 NOTE — Telephone Encounter (Signed)
I'm not sure what this minocycline is 4?

## 2016-04-05 MED FILL — DESCOVY 200-25 MG TABS: 200-25 | 30 days supply | Qty: 30 | Fill #7

## 2016-04-05 MED FILL — TIVICAY 50 MG TABLET: 50 | 30 days supply | Qty: 30 | Fill #7

## 2016-04-10 ENCOUNTER — Ambulatory Visit
Admission: RE | Admit: 2016-04-10 | Discharge: 2016-04-10 | Disposition: A | Discharge status: Home / Self Care | Payer: Medicare Other | Source: Ambulatory Visit | Attending: Neurology | Admitting: Neurology

## 2016-04-10 DIAGNOSIS — G500 Trigeminal neuralgia: Secondary | ICD-10-CM

## 2016-04-10 DIAGNOSIS — M792 Neuralgia and neuritis, unspecified: Secondary | ICD-10-CM

## 2016-04-10 MED ORDER — GADOBENATE DIMEGLUMINE 529 MG/ML IV SOLN
10.0000 mL | Freq: Once | INTRAVENOUS | Status: AC | PRN
  Administered 2016-04-10: 10 mL via INTRAVENOUS

## 2016-04-12 ENCOUNTER — Telehealth: Payer: Self-pay | Admitting: *Deleted

## 2016-04-12 NOTE — Telephone Encounter (Signed)
-----   Message from Anson FretAntonia B Ahern, MD sent at 04/12/2016  1:09 PM EST ----- MRI brain and face is normal. Incidental note is made of chronic maxillary and ethmoid sinusitis. thanks

## 2016-04-12 NOTE — Telephone Encounter (Signed)
Called and spoke to pt about results per AA.,MD note. She verbalized understanding.

## 2016-05-04 MED FILL — DESCOVY 200-25 MG TABS: 200-25 | 30 days supply | Qty: 30 | Fill #8

## 2016-05-04 MED FILL — TIVICAY 50 MG TABLET: 50 | 30 days supply | Qty: 30 | Fill #8

## 2016-06-02 MED FILL — DESCOVY 200-25 MG TABS: 200-25 | 30 days supply | Qty: 30 | Fill #9 | Status: TO

## 2016-06-11 IMAGING — CR DG HAND COMPLETE 3+V*L*
3 series · 3 of 3 positions shown · non-contrast
Comparison: None in PACs

CLINICAL DATA: Left wrist pain centered over the navicular for the
past week without known injury

EXAM:
LEFT WRIST - COMPLETE 3+ VIEW; LEFT HAND - COMPLETE 3+ VIEW

[view not recorded (1 of 3)]
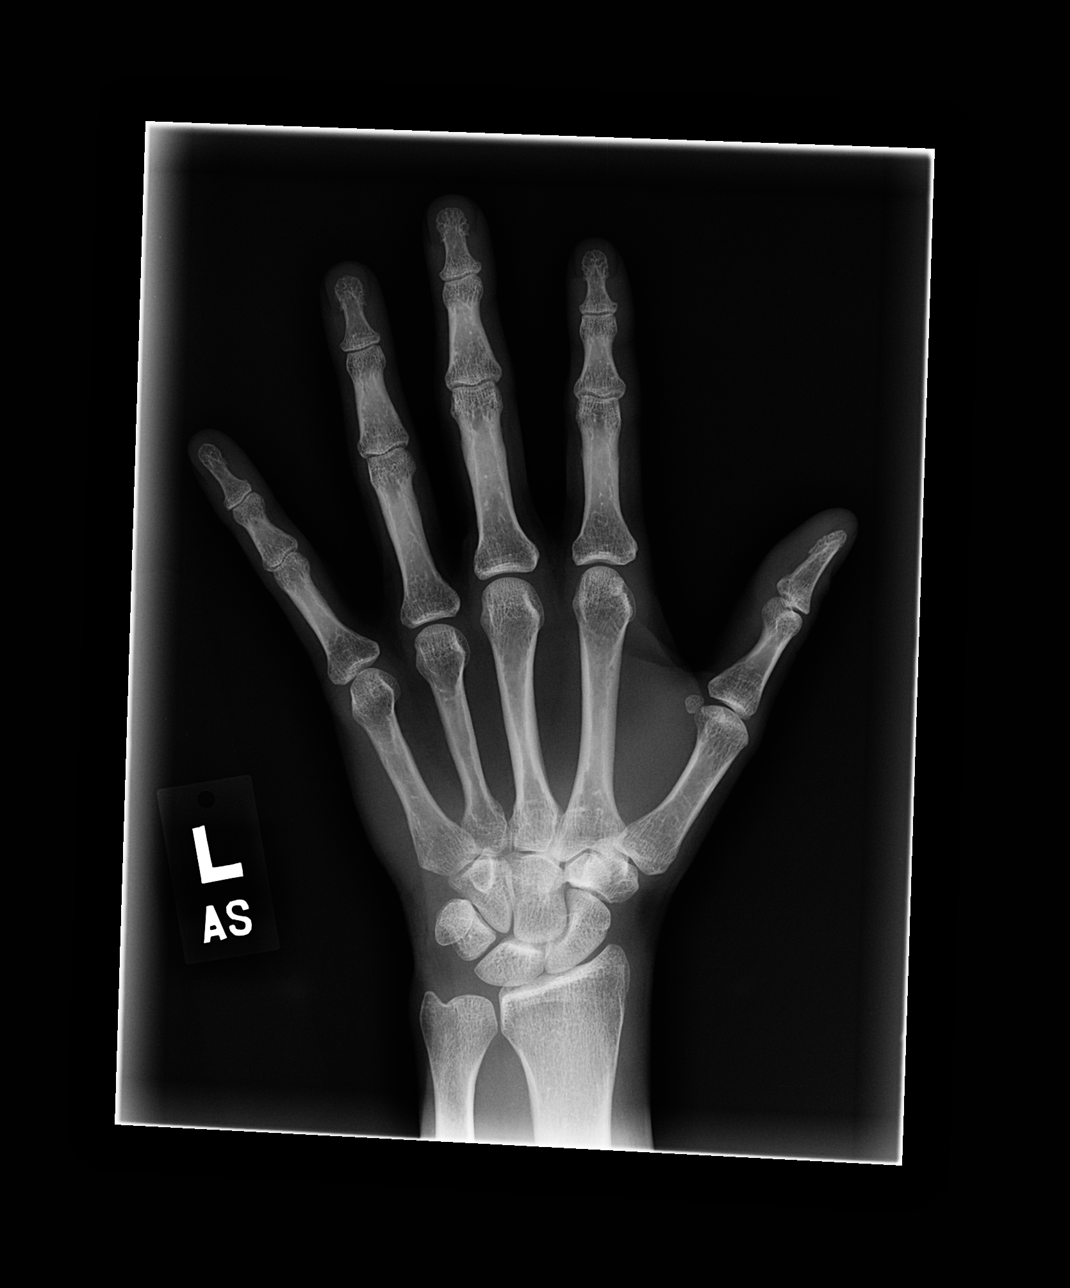

[view not recorded (2 of 3)]
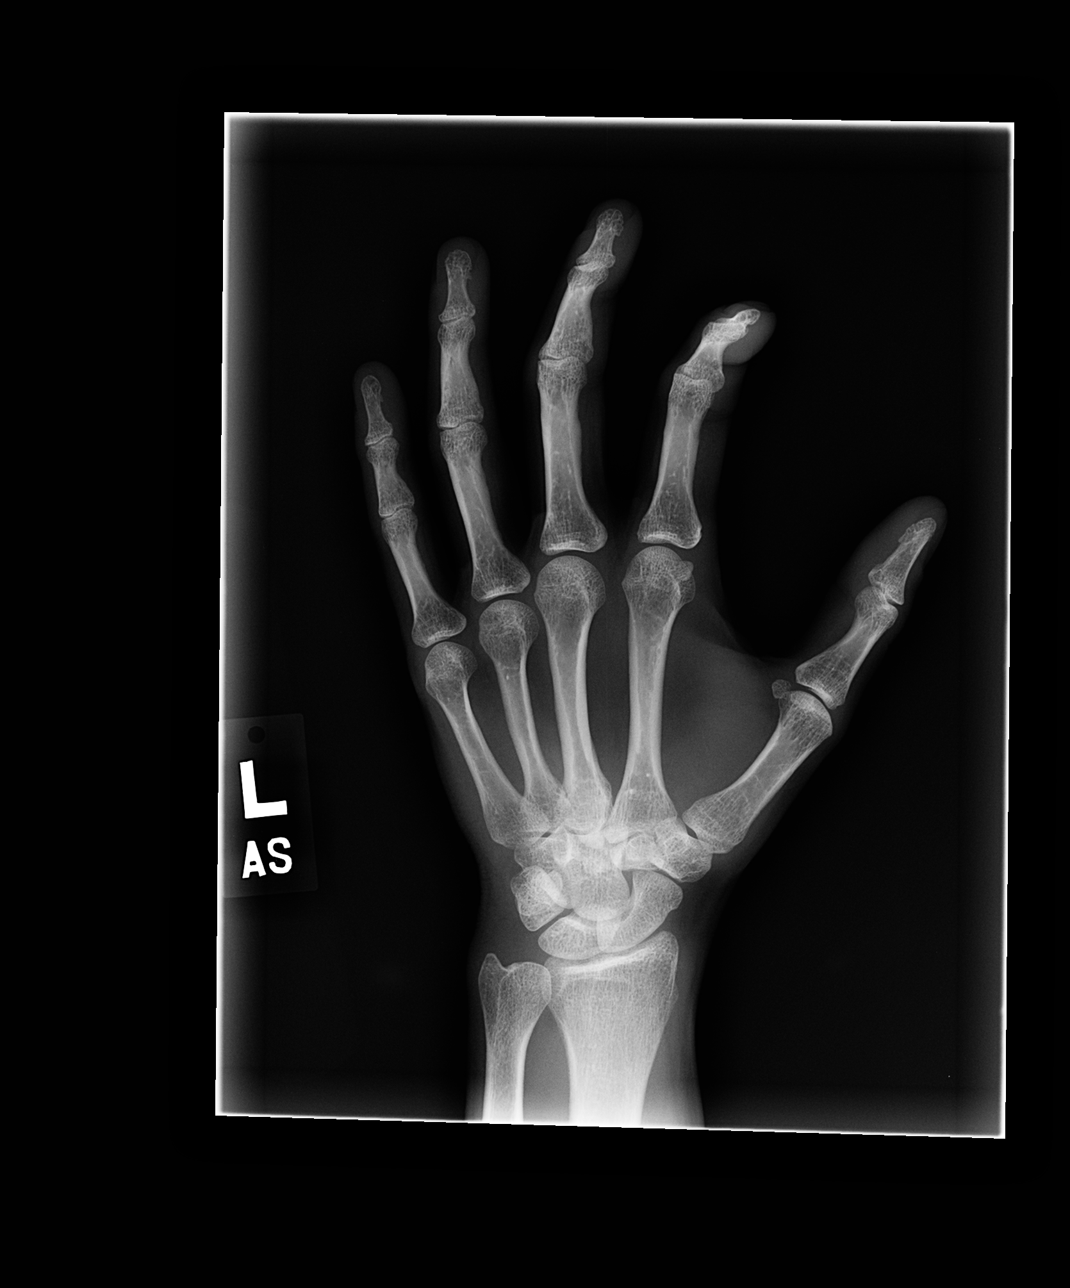

[view not recorded (3 of 3)]
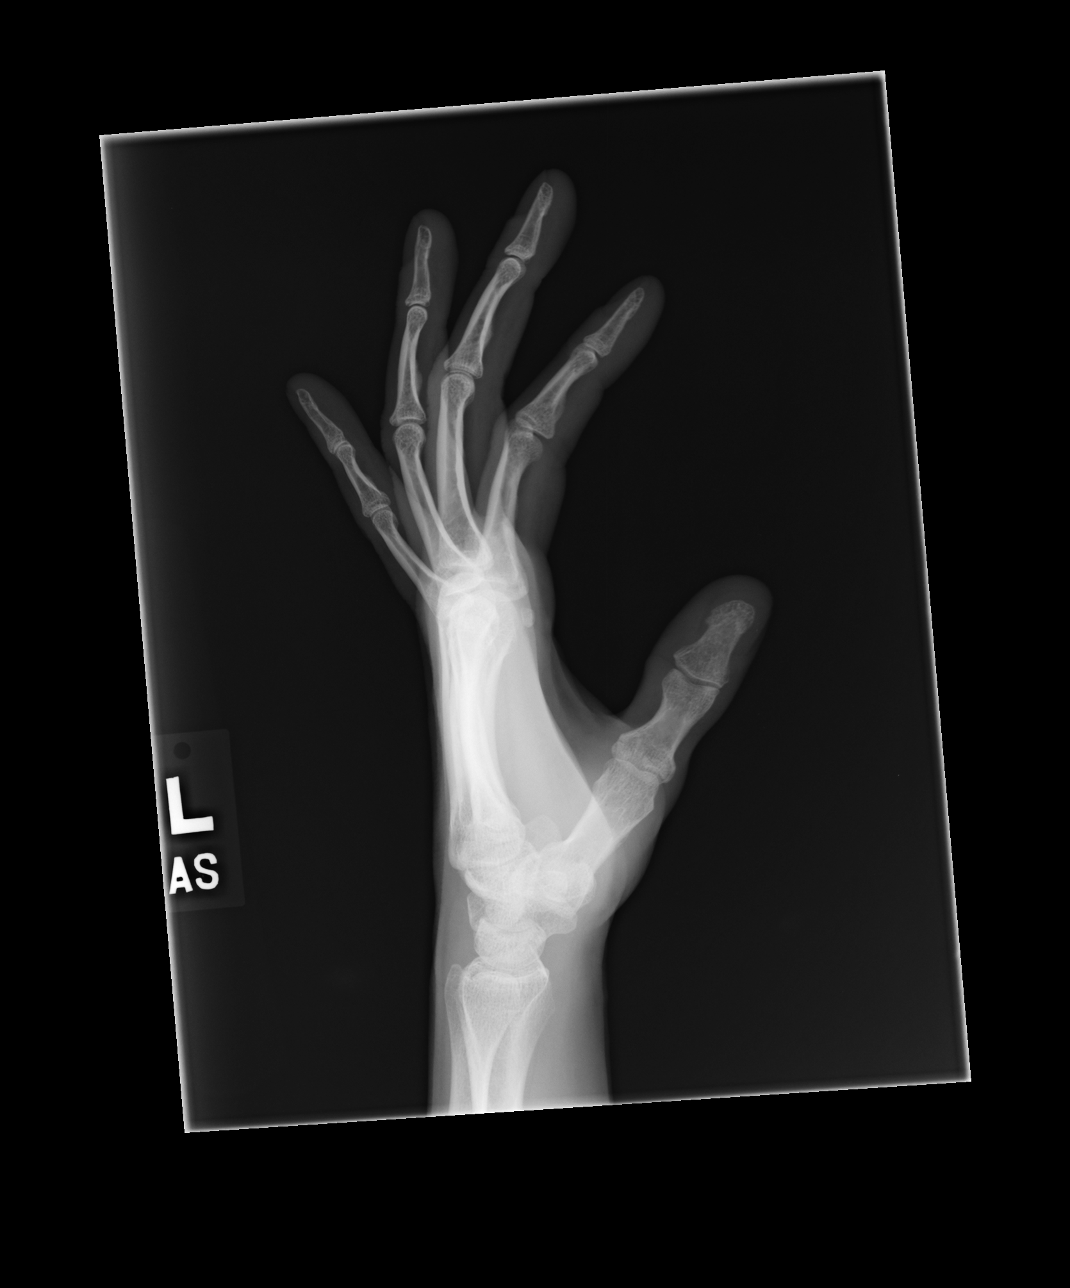

[3 of 3 positions shown; findings below may reference images not displayed]

FINDINGS: Left wrist: The bones of the wrist are adequately mineralized. There
is no acute fracture nor dislocation. The joint spaces are
preserved. The soft tissues are unremarkable. Specific attention to
the scaphoid reveals no acute abnormality.

Left hand: The bones of the hand are adequately mineralized. There
is no acute fracture nor dislocation. There is minimal narrowing of
the interphalangeal joints diffusely. The MCP joints are
unremarkable. No significant degenerative change of the
carpometacarpal joints is demonstrated. The soft tissues are
unremarkable.
IMPRESSION: There is no acute bony abnormality of the left wrist or left hand.
Minimal interphalangeal joint space narrowing is observed diffusely.

## 2016-07-14 ENCOUNTER — Other Ambulatory Visit: Payer: Medicare Other

## 2016-07-14 DIAGNOSIS — B20 Human immunodeficiency virus [HIV] disease: Secondary | ICD-10-CM

## 2016-07-14 DIAGNOSIS — Z79899 Other long term (current) drug therapy: Secondary | ICD-10-CM

## 2016-07-14 LAB — COMPREHENSIVE METABOLIC PANEL
ALBUMIN: 4 g/dL (ref 3.6–5.1)
ALK PHOS: 75 U/L (ref 33–115)
ALT: 7 U/L (ref 6–29)
AST: 12 U/L (ref 10–30)
BILIRUBIN TOTAL: 0.2 mg/dL (ref 0.2–1.2)
BUN: 11 mg/dL (ref 7–25)
CALCIUM: 8.7 mg/dL (ref 8.6–10.2)
CO2: 27 mmol/L (ref 20–31)
Chloride: 107 mmol/L (ref 98–110)
Creat: 0.73 mg/dL (ref 0.50–1.10)
Glucose, Bld: 87 mg/dL (ref 65–99)
Potassium: 4.8 mmol/L (ref 3.5–5.3)
Sodium: 141 mmol/L (ref 135–146)
Total Protein: 6.3 g/dL (ref 6.1–8.1)

## 2016-07-14 LAB — CBC WITH DIFFERENTIAL/PLATELET
Basophils Absolute: 51 cells/uL (ref 0–200)
Basophils Relative: 1 %
EOS ABS: 255 {cells}/uL (ref 15–500)
Eosinophils Relative: 5 %
HEMATOCRIT: 37.3 % (ref 35.0–45.0)
HEMOGLOBIN: 12.4 g/dL (ref 11.7–15.5)
Lymphocytes Relative: 48 %
Lymphs Abs: 2448 cells/uL (ref 850–3900)
MCH: 31.3 pg (ref 27.0–33.0)
MCHC: 33.2 g/dL (ref 32.0–36.0)
MCV: 94.2 fL (ref 80.0–100.0)
MONO ABS: 357 {cells}/uL (ref 200–950)
MPV: 8.7 fL (ref 7.5–12.5)
Monocytes Relative: 7 %
NEUTROS PCT: 39 %
Neutro Abs: 1989 cells/uL (ref 1500–7800)
Platelets: 224 10*3/uL (ref 140–400)
RBC: 3.96 MIL/uL (ref 3.80–5.10)
RDW: 12.8 % (ref 11.0–15.0)
WBC: 5.1 10*3/uL (ref 3.8–10.8)

## 2016-07-14 LAB — LIPID PANEL
CHOLESTEROL: 123 mg/dL (ref <200)
HDL: 32 mg/dL — AB (ref >50)
LDL Cholesterol: 43 mg/dL (ref <100)
TRIGLYCERIDES: 241 mg/dL — AB (ref <150)
Total CHOL/HDL Ratio: 3.8 Ratio (ref <5.0)
VLDL: 48 mg/dL — ABNORMAL HIGH (ref <30)

## 2016-07-15 LAB — T-HELPER CELL (CD4) - (RCID CLINIC ONLY)
CD4 T CELL ABS: 1010 /uL (ref 400–2700)
CD4 T CELL HELPER: 38 % (ref 33–55)

## 2016-07-21 LAB — HIV-1 RNA QUANT-NO REFLEX-BLD
HIV 1 RNA Quant: <20 copies/mL
HIV-1 RNA Quant, Log: <1.3 Log copies/mL

## 2016-07-27 ENCOUNTER — Encounter: Payer: Self-pay | Admitting: Neurology

## 2016-07-27 ENCOUNTER — Ambulatory Visit (INDEPENDENT_AMBULATORY_CARE_PROVIDER_SITE_OTHER): Payer: Medicare Other | Admitting: Neurology

## 2016-07-27 VITALS — BP 92/67 | HR 74 | Ht 66.0 in | Wt 125.8 lb

## 2016-07-27 DIAGNOSIS — R569 Unspecified convulsions: Secondary | ICD-10-CM | POA: Diagnosis not present
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
## 2016-07-27 NOTE — Patient Instructions (Signed)
Remember to drink plenty of fluid, eat healthy meals and do not skip any meals. Try to eat protein with a every meal and eat a healthy snack such as fruit or nuts in between meals. Try to keep a regular sleep-wake schedule and try to exercise daily, particularly in the form of walking, 20-30 minutes a day, if you can.   As far as your medications are concerned, I would like to suggest: Continue Current medications  As far as diagnostic testing:   I would like to see you back in 6-8 months, sooner if we need to. Please call us with any interim questions, concerns, problems, updates or refill requests.   Our phone number is [phone]. We also have an after hours call service for urgent matters and there is a physician on-call for urgent questions. For any emergencies you know to call 911 or go to the nearest emergency room

## 2016-07-28 ENCOUNTER — Ambulatory Visit (INDEPENDENT_AMBULATORY_CARE_PROVIDER_SITE_OTHER): Payer: Medicare Other | Admitting: Infectious Disease

## 2016-07-28 ENCOUNTER — Encounter: Payer: Self-pay | Admitting: Infectious Disease

## 2016-07-28 VITALS — BP 134/83 | HR 85 | Temp 98.5°F | Ht 66.0 in | Wt 124.0 lb

## 2016-07-28 DIAGNOSIS — L709 Acne, unspecified: Secondary | ICD-10-CM | POA: Diagnosis not present

## 2016-07-28 DIAGNOSIS — Z113 Encounter for screening for infections with a predominantly sexual mode of transmission: Secondary | ICD-10-CM | POA: Diagnosis not present

## 2016-07-28 DIAGNOSIS — R569 Unspecified convulsions: Secondary | ICD-10-CM | POA: Diagnosis not present

## 2016-07-28 DIAGNOSIS — F1991 Other psychoactive substance use, unspecified, in remission: Secondary | ICD-10-CM

## 2016-07-28 DIAGNOSIS — B20 Human immunodeficiency virus [HIV] disease: Secondary | ICD-10-CM | POA: Diagnosis present

## 2016-07-28 DIAGNOSIS — Z23 Encounter for immunization: Secondary | ICD-10-CM

## 2016-07-28 DIAGNOSIS — Z87898 Personal history of other specified conditions: Secondary | ICD-10-CM | POA: Diagnosis not present

## 2016-07-28 DIAGNOSIS — Z79899 Other long term (current) drug therapy: Secondary | ICD-10-CM

## 2016-07-28 MED ORDER — EMTRICITABINE-TENOFOVIR AF 200-25 MG PO TABS: 1.0000 | ORAL_TABLET | Freq: Every day | ORAL | 11 refills | Status: DC

## 2016-07-28 MED ORDER — DOLUTEGRAVIR SODIUM 50 MG PO TABS: 50.0000 mg | ORAL_TABLET | Freq: Every day | ORAL | 11 refills | Status: DC

## 2016-07-28 MED ORDER — MENINGOCOCCAL A C Y&W-135 OLIG IM SOLR
0.5000 mL | Freq: Once | INTRAMUSCULAR | Status: AC
  Administered 2016-07-28: 0.5 mL via INTRAMUSCULAR

## 2016-07-28 MED ORDER — MINOCYCLINE HCL 100 MG PO CAPS: 100.0000 mg | ORAL_CAPSULE | Freq: Two times a day (BID) | ORAL | 11 refills | Status: DC

## 2016-07-28 NOTE — Progress Notes (Signed)
Chief complaint: Follow-up for HIV on medications also with concern for acne  Subjective:    Patient ID: [name], female    DOB: 05-30-1981, 36 y.o.   MRN: [id number]  HPI  36 year old Caucasian lady with HIV infection relatively recently diagnosed and started on anti-retroviral indications in the form of TIVICAY and Epivir via the AIDS clinical trial group and now Tivicay and Descovy.  Lab Results  Component Value Date   HIV1RNAQUANT <20 NOT DETECTED 07/14/2016   HIV1RNAQUANT <20 01/14/2016   HIV1RNAQUANT <20 11/16/2015     Lab Results  Component Value Date   CD4TABS 1,010 07/14/2016   CD4TABS 710 01/14/2016   CD4TABS 600 11/16/2015    She is being worked up and followed closely by neurology. She is suffering from acne requested if I could refill her minocycline production which I have done.     Past Medical History:  Diagnosis Date  . Acne 07/28/2016  . Anxiety   . Arthritis 2000   Right wrist/hand, prior fracture  . Chronic headaches 2005   Migraines  . Depression 2001  . Gallstones   . 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 HIV infection (HCC) 2016  . Mold exposure 01/28/2016  . Nausea with vomiting 01/28/2016  . PAC (premature atrial contraction) 06/24/2015  . Pneumonia   . Seizures (HCC)   . Sleep apnea   . Somatization disorder 01/28/2016    Past Surgical History:  Procedure Laterality Date  . CHOLECYSTECTOMY    . FRACTURE SURGERY     2-right hand  . ROTATOR CUFF REPAIR Right 2000   repair frozen shoulder 2001  . URETER SURGERY     Placement and removal     Family History  Problem Relation Age of Onset  . Diabetes Maternal Grandfather   . Heart disease Maternal Grandfather   . Breast cancer Mother   . Seizures Neg Hx       Social History   Social History  . Marital status: Legally Separated    Spouse name: Jonny Ruiz  . Number of children: 2  . Years of education: 12+   Occupational  History  . Unemployed    Social History Main Topics  . Smoking status: Current Every Day Smoker    Packs/day: 0.80    Years: 24.00    Types: Cigarettes  . Smokeless tobacco: Never Used     Comment: would like to quit/Rx for nicorrette gum  . Alcohol use No  . Drug use: Yes    Frequency: 7.0 times per week    Types: Marijuana     Comment: subutex  . Sexual activity: Not Currently    Partners: Male     Comment: declined condoms   Other Topics Concern  . None   Social History Narrative   Lives at home with kids   Caffeine use: Drinks coffee (1 cup per day)   Right-handed    Allergies  Allergen Reactions  . Robinul [Glycopyrrolate] Other (See Comments)    Urinary retention     Current Outpatient Prescriptions:  .  baclofen (LIORESAL) 10 MG tablet, Take 1 tablet (10 mg total) by mouth 3 (three) times daily., Disp: 30 each, Rfl: 11 .  buprenorphine (SUBUTEX) 8 MG SUBL SL tablet, Place 8 mg under the tongue 3 (three) times daily. , Disp: , Rfl:  .  dolutegravir (TIVICAY) 50 MG tablet, Take 1 tablet (50 mg total) by mouth  daily., Disp: 30 tablet, Rfl: 11 .  emtricitabine-tenofovir AF (DESCOVY) 200-25 MG tablet, Take 1 tablet by mouth daily., Disp: 30 tablet, Rfl: 11 .  gabapentin (NEURONTIN) 300 MG capsule, Take 1 capsule (300 mg total) by mouth 3 (three) times daily. Take 1-2 tablets up to three times a day for trigeminal neuralgia., Disp: 90 capsule, Rfl: 11 .  lamoTRIgine (LAMICTAL) 200 MG tablet, Take 1 tablet (200 mg total) by mouth 2 (two) times daily., Disp: 60 tablet, Rfl: 11 .  naproxen (NAPROSYN) 500 MG tablet, Take 1 tablet (500 mg total) by mouth 2 (two) times daily with a meal., Disp: 60 tablet, Rfl: 1 .  ondansetron (ZOFRAN-ODT) 4 MG disintegrating tablet, Take 1 tablet (4 mg total) by mouth every 8 (eight) hours as needed for nausea., Disp: 60 tablet, Rfl: 11 .  traZODone (DESYREL) 100 MG tablet, Take 200 mg by mouth at bedtime as needed., Disp: , Rfl:  .   minocycline (MINOCIN) 100 MG capsule, Take 1 capsule (100 mg total) by mouth 2 (two) times daily., Disp: 60 capsule, Rfl: 11    Review of Systems  Constitutional: Negative for activity change, chills, diaphoresis, fatigue and unexpected weight change.  HENT: Negative for rhinorrhea, sinus pressure, sneezing and sore throat.   Eyes: Negative for photophobia and visual disturbance.  Respiratory: Negative for chest tightness and wheezing.   Cardiovascular: Negative for chest pain, palpitations and leg swelling.  Gastrointestinal: Negative for anal bleeding and blood in stool.  Genitourinary: Negative for difficulty urinating and flank pain.  Musculoskeletal: Negative for back pain, gait problem and joint swelling.  Skin: Positive for rash. Negative for color change, pallor and wound.  Neurological: Negative for dizziness, tremors, seizures, weakness and light-headedness.  Hematological: Negative for adenopathy. Does not bruise/bleed easily.  Psychiatric/Behavioral: Negative for agitation, behavioral problems, confusion, decreased concentration and sleep disturbance.       Objective:   Physical Exam  Constitutional: She is oriented to person, place, and time. She appears well-developed and well-nourished. No distress.  HENT:  Head: Normocephalic and atraumatic.  Mouth/Throat: No oropharyngeal exudate.  Eyes: Conjunctivae and EOM are normal. No scleral icterus.  Neck: Normal range of motion. Neck supple.  Cardiovascular: Normal rate and regular rhythm.   Pulmonary/Chest: Effort normal. No respiratory distress. She has no wheezes.  Abdominal: Soft. She exhibits no distension, no pulsatile liver, no fluid wave and no ascites. There is no rigidity, no guarding, no CVA tenderness, no tenderness at McBurney's point and negative Murphy's sign.  Musculoskeletal: She exhibits no edema or tenderness.  Neurological: She is alert and oriented to person, place, and time. She exhibits normal muscle  tone. Coordination normal.  Skin: Skin is warm and dry. No rash noted. She is not diaphoretic. No erythema. No pallor.  Psychiatric: She has a normal mood and affect. Her behavior is normal. Judgment and thought content normal.          Assessment & Plan:  [id number]   Tivicay and Descovy  Perfect control, Return to clinic in 6 months  Opioid maintenance being seen by clinic here in town.  Depression schizoaffective disorder: seeing psychology and primary care  Seizures: on AEDs and they do not appear to  interact with her TIVICAY. He is being followed by neurology  Acne: I have refilled minocycline  I spent greater than 25 minutes with the patient including greater than 50% of time in face to face counsel of the patient re her HIV, , seizures acne  and in  coordination of her care.

## 2016-08-02 MED FILL — TIVICAY 50 MG TABLET: 50 | 30 days supply | Qty: 30 | Fill #0

## 2016-08-02 MED FILL — MINOCYCLINE 100 MG CAPSULE: 100 | 30 days supply | Qty: 60 | Fill #0

## 2016-08-02 MED FILL — DESCOVY 200-25 MG TABS: 200-25 | 30 days supply | Qty: 30 | Fill #0

## 2016-09-01 MED FILL — DESCOVY 200-25 MG TABS: 200-25 | 30 days supply | Qty: 30 | Fill #1

## 2016-09-01 MED FILL — TIVICAY 50 MG TABLET: 50 | 30 days supply | Qty: 30 | Fill #1

## 2016-09-01 MED FILL — MINOCYCLINE 100 MG CAPSULE: 100 | 30 days supply | Qty: 60 | Fill #1

## 2016-09-19 ENCOUNTER — Ambulatory Visit (INDEPENDENT_AMBULATORY_CARE_PROVIDER_SITE_OTHER): Payer: Medicare Other | Admitting: *Deleted

## 2016-09-19 DIAGNOSIS — Z23 Encounter for immunization: Secondary | ICD-10-CM | POA: Diagnosis not present

## 2016-09-19 DIAGNOSIS — B20 Human immunodeficiency virus [HIV] disease: Secondary | ICD-10-CM | POA: Diagnosis not present

## 2016-10-22 MED FILL — DESCOVY 200-25 MG TABS: 200-25 | 30 days supply | Qty: 30 | Fill #2

## 2016-10-22 MED FILL — TIVICAY 50 MG TABLET: 50 | 30 days supply | Qty: 30 | Fill #2

## 2016-10-28 MED FILL — MINOCYCLINE 100 MG CAPSULE: 100 | 30 days supply | Qty: 60 | Fill #2

## 2016-11-22 MED FILL — DESCOVY 200-25 MG TABS: 200-25 | 30 days supply | Qty: 30 | Fill #3

## 2016-11-22 MED FILL — TIVICAY 50 MG TABLET: 50 | 30 days supply | Qty: 30 | Fill #3

## 2016-11-22 MED FILL — MINOCYCLINE 100 MG CAPSULE: 100 | 30 days supply | Qty: 60 | Fill #3

## 2016-12-12 ENCOUNTER — Other Ambulatory Visit: Payer: Self-pay | Admitting: Neurology

## 2016-12-12 DIAGNOSIS — R110 Nausea: Secondary | ICD-10-CM

## 2016-12-12 DIAGNOSIS — R569 Unspecified convulsions: Secondary | ICD-10-CM

## 2017-01-03 MED FILL — DESCOVY 200-25 MG TABS: 200-25 | 30 days supply | Qty: 30 | Fill #4

## 2017-01-03 MED FILL — TIVICAY 50 MG TABLET: 50 | 30 days supply | Qty: 30 | Fill #4

## 2017-01-04 ENCOUNTER — Ambulatory Visit: Payer: Medicare Other | Admitting: Infectious Disease

## 2017-01-17 ENCOUNTER — Encounter: Payer: Self-pay | Admitting: Infectious Disease

## 2017-01-17 ENCOUNTER — Other Ambulatory Visit (HOSPITAL_COMMUNITY)
Admission: RE | Admit: 2017-01-17 | Discharge: 2017-01-17 | Disposition: A | Discharge status: Home / Self Care | Payer: Medicare Other | Source: Ambulatory Visit | Attending: Infectious Disease | Admitting: Infectious Disease

## 2017-01-17 ENCOUNTER — Ambulatory Visit (INDEPENDENT_AMBULATORY_CARE_PROVIDER_SITE_OTHER): Payer: Medicare Other | Admitting: Infectious Disease

## 2017-01-17 VITALS — BP 105/68 | HR 72 | Temp 98.4°F | Wt 113.0 lb

## 2017-01-17 DIAGNOSIS — R5382 Chronic fatigue, unspecified: Secondary | ICD-10-CM

## 2017-01-17 DIAGNOSIS — B20 Human immunodeficiency virus [HIV] disease: Secondary | ICD-10-CM | POA: Insufficient documentation

## 2017-01-17 DIAGNOSIS — R569 Unspecified convulsions: Secondary | ICD-10-CM | POA: Diagnosis not present

## 2017-01-17 DIAGNOSIS — Z79899 Other long term (current) drug therapy: Secondary | ICD-10-CM | POA: Diagnosis not present

## 2017-01-17 DIAGNOSIS — Z113 Encounter for screening for infections with a predominantly sexual mode of transmission: Secondary | ICD-10-CM | POA: Insufficient documentation

## 2017-01-17 LAB — CBC WITH DIFFERENTIAL/PLATELET
BASOS PCT: 1 %
Basophils Absolute: 41 cells/uL (ref 0–200)
EOS PCT: 2 %
Eosinophils Absolute: 82 cells/uL (ref 15–500)
HEMATOCRIT: 39 % (ref 35.0–45.0)
HEMOGLOBIN: 12.7 g/dL (ref 11.7–15.5)
LYMPHS ABS: 2009 {cells}/uL (ref 850–3900)
Lymphocytes Relative: 49 %
MCH: 30.4 pg (ref 27.0–33.0)
MCHC: 32.6 g/dL (ref 32.0–36.0)
MCV: 93.3 fL (ref 80.0–100.0)
MONO ABS: 246 {cells}/uL (ref 200–950)
MPV: 8.8 fL (ref 7.5–12.5)
Monocytes Relative: 6 %
NEUTROS ABS: 1722 {cells}/uL (ref 1500–7800)
Neutrophils Relative %: 42 %
Platelets: 221 10*3/uL (ref 140–400)
RBC: 4.18 MIL/uL (ref 3.80–5.10)
RDW: 13.2 % (ref 11.0–15.0)
WBC: 4.1 10*3/uL (ref 3.8–10.8)

## 2017-01-17 NOTE — Progress Notes (Signed)
Chief complaint: Follow-up for HIV on medications   Subjective:    Patient ID: [name], female    DOB: 1981/03/29, 36 y.o.   MRN: [id number]  HPI  36 year old Caucasian lady with HIV infection relatively recently diagnosed and started on anti-retroviral indications in the form of TIVICAY and Epivir via the AIDS clinical trial group and now Tivicay and Descovy.  Lab Results  Component Value Date   HIV1RNAQUANT <20 NOT DETECTED 07/14/2016   HIV1RNAQUANT <20 01/14/2016   HIV1RNAQUANT <20 11/16/2015     Lab Results  Component Value Date   CD4TABS 1,010 07/14/2016   CD4TABS 710 01/14/2016   CD4TABS 600 11/16/2015    She is in good spirits and is doing well. Her seizures have been well controlled. She is also on medications for hyperthyroidism with methimazole. She has no specific complaints today.   Past Medical History:  Diagnosis Date  . Acne 07/28/2016  . Anxiety   . Arthritis 2000   Right wrist/hand, prior fracture  . Chronic headaches 2005   Migraines  . Depression 2001  . Gallstones   . 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 HIV infection (HCC) 2016  . Mold exposure 01/28/2016  . Nausea with vomiting 01/28/2016  . PAC (premature atrial contraction) 06/24/2015  . Pneumonia   . Seizures (HCC)   . Sleep apnea   . Somatization disorder 01/28/2016    Past Surgical History:  Procedure Laterality Date  . CHOLECYSTECTOMY    . FRACTURE SURGERY     2-right hand  . ROTATOR CUFF REPAIR Right 2000   repair frozen shoulder 2001  . URETER SURGERY     Placement and removal     Family History  Problem Relation Age of Onset  . Diabetes Maternal Grandfather   . Heart disease Maternal Grandfather   . Breast cancer Mother   . Seizures Neg Hx       Social History   Social History  . Marital status: Legally Separated    Spouse name: Jonny Ruiz  . Number of children: 2  . Years of education: 12+   Occupational History    . Unemployed    Social History Main Topics  . Smoking status: Current Every Day Smoker    Packs/day: 0.80    Years: 24.00    Types: Cigarettes  . Smokeless tobacco: Never Used     Comment: would like to quit/Rx for nicorrette gum  . Alcohol use No  . Drug use: Yes    Frequency: 7.0 times per week    Types: Marijuana     Comment: subutex  . Sexual activity: Not Currently    Partners: Male     Comment: declined condoms   Other Topics Concern  . None   Social History Narrative   Lives at home with kids   Caffeine use: Drinks coffee (1 cup per day)   Right-handed    Allergies  Allergen Reactions  . Robinul [Glycopyrrolate] Other (See Comments)    Urinary retention     Current Outpatient Prescriptions:  .  baclofen (LIORESAL) 10 MG tablet, Take 1 tablet (10 mg total) by mouth 3 (three) times daily., Disp: 30 each, Rfl: 11 .  buprenorphine (SUBUTEX) 8 MG SUBL SL tablet, Place 8 mg under the tongue 3 (three) times daily. , Disp: , Rfl:  .  dolutegravir (TIVICAY) 50 MG tablet, Take 1 tablet (50 mg total) by mouth daily., Disp:  30 tablet, Rfl: 11 .  emtricitabine-tenofovir AF (DESCOVY) 200-25 MG tablet, Take 1 tablet by mouth daily., Disp: 30 tablet, Rfl: 11 .  gabapentin (NEURONTIN) 300 MG capsule, Take 1 capsule (300 mg total) by mouth 3 (three) times daily. Take 1-2 tablets up to three times a day for trigeminal neuralgia., Disp: 90 capsule, Rfl: 11 .  lamoTRIgine (LAMICTAL) 200 MG tablet, Take 1 tablet (200 mg total) by mouth 2 (two) times daily., Disp: 60 tablet, Rfl: 11 .  methimazole (TAPAZOLE) 5 MG tablet, Take 5 mg by mouth., Disp: , Rfl:  .  minocycline (MINOCIN) 100 MG capsule, Take 1 capsule (100 mg total) by mouth 2 (two) times daily., Disp: 60 capsule, Rfl: 11 .  naproxen (NAPROSYN) 500 MG tablet, Take 1 tablet (500 mg total) by mouth 2 (two) times daily with a meal., Disp: 60 tablet, Rfl: 1 .  ondansetron (ZOFRAN-ODT) 4 MG disintegrating tablet, TAKE 1 TABLET (4 MG  TOTAL) BY MOUTH EVERY 8 (EIGHT) HOURS AS NEEDED FOR NAUSEA., Disp: 60 tablet, Rfl: 11 .  traZODone (DESYREL) 100 MG tablet, Take 200 mg by mouth at bedtime as needed., Disp: , Rfl:     Review of Systems  Constitutional: Negative for activity change, chills, diaphoresis, fatigue and unexpected weight change.  HENT: Negative for rhinorrhea, sinus pressure, sneezing and sore throat.   Eyes: Negative for photophobia and visual disturbance.  Respiratory: Negative for chest tightness and wheezing.   Cardiovascular: Negative for chest pain, palpitations and leg swelling.  Gastrointestinal: Negative for anal bleeding and blood in stool.  Genitourinary: Negative for difficulty urinating and flank pain.  Musculoskeletal: Negative for back pain, gait problem and joint swelling.  Skin: Negative for color change, pallor and wound.  Neurological: Negative for dizziness, tremors, seizures, weakness and light-headedness.  Hematological: Negative for adenopathy. Does not bruise/bleed easily.  Psychiatric/Behavioral: Negative for agitation, behavioral problems, confusion, decreased concentration and sleep disturbance.       Objective:   Physical Exam  Constitutional: She is oriented to person, place, and time. She appears well-developed and well-nourished. No distress.  HENT:  Head: Normocephalic and atraumatic.  Mouth/Throat: No oropharyngeal exudate.  Eyes: Conjunctivae and EOM are normal. No scleral icterus.  Neck: Normal range of motion. Neck supple.  Cardiovascular: Normal rate and regular rhythm.   Pulmonary/Chest: Effort normal. No respiratory distress. She has no wheezes.  Abdominal: Soft. She exhibits no distension, no pulsatile liver, no fluid wave and no ascites. There is no rigidity, no guarding, no CVA tenderness, no tenderness at McBurney's point and negative Murphy's sign.  Musculoskeletal: She exhibits no edema or tenderness.  Neurological: She is alert and oriented to person, place,  and time. She exhibits normal muscle tone. Coordination normal.  Skin: Skin is warm and dry. No rash noted. She is not diaphoretic. No erythema. No pallor.  Psychiatric: She has a normal mood and affect. Her behavior is normal. Judgment and thought content normal.          Assessment & Plan:  [id number]   Tivicay and Descovy  Perfect control, Return to clinic in 6 months  Opioid maintenance being seen by clinic here in town.  Depression schizoaffective disorder: seeing psychology and primary care  Seizures: on AEDs (Lamictal) and they do not appear to  interact with her TIVICAY. He is being followed by neurology

## 2017-01-18 LAB — COMPLETE METABOLIC PANEL WITH GFR
ALK PHOS: 69 U/L (ref 33–115)
ALT: 9 U/L (ref 6–29)
AST: 14 U/L (ref 10–30)
Albumin: 4.2 g/dL (ref 3.6–5.1)
BUN: 7 mg/dL (ref 7–25)
CALCIUM: 9.2 mg/dL (ref 8.6–10.2)
CHLORIDE: 107 mmol/L (ref 98–110)
CO2: 23 mmol/L (ref 20–32)
Creat: 0.74 mg/dL (ref 0.50–1.10)
Glucose, Bld: 113 mg/dL — ABNORMAL HIGH (ref 65–99)
POTASSIUM: 4.5 mmol/L (ref 3.5–5.3)
Sodium: 140 mmol/L (ref 135–146)
Total Bilirubin: 0.4 mg/dL (ref 0.2–1.2)
Total Protein: 6.2 g/dL (ref 6.1–8.1)

## 2017-01-18 LAB — LIPID PANEL
CHOLESTEROL: 171 mg/dL (ref <200)
HDL: 38 mg/dL — AB (ref >50)
LDL CALC: 113 mg/dL — AB (ref <100)
TRIGLYCERIDES: 99 mg/dL (ref <150)
Total CHOL/HDL Ratio: 4.5 Ratio (ref <5.0)
VLDL: 20 mg/dL (ref <30)

## 2017-01-18 LAB — MICROALBUMIN / CREATININE URINE RATIO
Creatinine, Urine: 35 mg/dL (ref 20–320)
MICROALB/CREAT RATIO: 23 ug/mg{creat} (ref <30)
Microalb, Ur: 0.8 mg/dL

## 2017-01-18 LAB — RPR

## 2017-01-19 LAB — URINE CYTOLOGY ANCILLARY ONLY
Chlamydia: NEGATIVE
Neisseria Gonorrhea: NEGATIVE

## 2017-01-19 LAB — T-HELPER CELL (CD4) - (RCID CLINIC ONLY)
CD4 T CELL HELPER: 44 % (ref 33–55)
CD4 T Cell Abs: 940 /uL (ref 400–2700)

## 2017-01-20 LAB — HIV-1 RNA QUANT-NO REFLEX-BLD
HIV 1 RNA QUANT: NOT DETECTED {copies}/mL
HIV-1 RNA QUANT, LOG: NOT DETECTED {Log_copies}/mL

## 2017-01-27 MED FILL — TIVICAY 50 MG TABLET: 50 | 30 days supply | Qty: 30 | Fill #5

## 2017-01-27 MED FILL — DESCOVY 200-25 MG TABS: 200-25 | 30 days supply | Qty: 30 | Fill #5

## 2017-03-06 MED FILL — DESCOVY 200-25 MG TABS: 200-25 | 30 days supply | Qty: 30 | Fill #6

## 2017-03-06 MED FILL — MINOCYCLINE 100 MG CAPSULE: 100 | 30 days supply | Qty: 60 | Fill #4

## 2017-03-06 MED FILL — TIVICAY 50 MG TABLET: 50 | 30 days supply | Qty: 30 | Fill #6

## 2017-03-15 ENCOUNTER — Encounter: Payer: Self-pay | Admitting: Neurology

## 2017-03-15 ENCOUNTER — Ambulatory Visit (INDEPENDENT_AMBULATORY_CARE_PROVIDER_SITE_OTHER): Payer: Medicare Other | Admitting: Neurology

## 2017-03-15 VITALS — BP 116/79 | HR 68 | Wt 114.2 lb

## 2017-03-15 DIAGNOSIS — R569 Unspecified convulsions: Secondary | ICD-10-CM

## 2017-03-15 DIAGNOSIS — R110 Nausea: Secondary | ICD-10-CM

## 2017-03-15 DIAGNOSIS — G43009 Migraine without aura, not intractable, without status migrainosus: Secondary | ICD-10-CM

## 2017-03-15 MED ORDER — LAMOTRIGINE 200 MG PO TABS: 200.0000 mg | ORAL_TABLET | Freq: Two times a day (BID) | ORAL | 11 refills | Status: DC

## 2017-03-15 MED ORDER — GABAPENTIN 300 MG PO CAPS: 300.0000 mg | ORAL_CAPSULE | Freq: Three times a day (TID) | ORAL | 11 refills | Status: DC

## 2017-03-15 MED ORDER — BACLOFEN 10 MG PO TABS: 10.0000 mg | ORAL_TABLET | Freq: Three times a day (TID) | ORAL | 11 refills | Status: DC

## 2017-03-15 MED ORDER — ONDANSETRON 4 MG PO TBDP: 4.0000 mg | ORAL_TABLET | Freq: Three times a day (TID) | ORAL | 11 refills | Status: DC | PRN

## 2017-03-15 MED ORDER — RIZATRIPTAN BENZOATE 10 MG PO TABS: 10.0000 mg | ORAL_TABLET | ORAL | 11 refills | Status: DC | PRN
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
## 2017-03-15 NOTE — Patient Instructions (Addendum)
At onset of migraine if not severe can try 1000mg  tylenol or 800mg  ibuprofen. If severe, take maxalt(Rizatriptan) immediately and can take it with zofran, may repeat in 2 hours if needed.   Rizatriptan tablets What is this medicine? RIZATRIPTAN (rye za TRIP tan) is used to treat migraines with or without aura. An aura is a strange feeling or visual disturbance that warns you of an attack. It is not used to prevent migraines. This medicine may be used for other purposes; ask your health care provider or pharmacist if you have questions. COMMON BRAND NAME(S): Maxalt What should I tell my health care provider before I take this medicine? They need to know if you have any of these conditions: -bowel disease or colitis -diabetes -family history of heart disease -fast or irregular heart beat -heart or blood vessel disease, angina (chest pain), or previous heart attack -high blood pressure -high cholesterol -history of stroke, transient ischemic attacks (TIAs or mini-strokes), or intracranial bleeding -kidney or liver disease -overweight -poor circulation -postmenopausal or surgical removal of uterus and ovaries -Raynaud's disease -seizure disorder -an unusual or allergic reaction to rizatriptan, other medicines, foods, dyes, or preservatives -pregnant or trying to get pregnant -breast-feeding How should I use this medicine? This medicine is taken by mouth with a glass of water. Follow the directions on the prescription label. This medicine is taken at the first symptoms of a migraine. It is not for everyday use. If your migraine headache returns after one dose, you can take another dose as directed. You must leave at least 2 hours between doses, and do not take more than 30 mg total in 24 hours. If there is no improvement at all after the first dose, do not take a second dose without talking to your doctor or health care professional. Do not take your medicine more often than directed. Talk to  your pediatrician regarding the use of this medicine in children. While this drug may be prescribed for children as young as 6 years for selected conditions, precautions do apply. Overdosage: If you think you have taken too much of this medicine contact a poison control center or emergency room at once. NOTE: This medicine is only for you. Do not share this medicine with others. What if I miss a dose? This does not apply; this medicine is not for regular use. What may interact with this medicine? Do not take this medicine with any of the following medicines: -amphetamine, dextroamphetamine or cocaine -dihydroergotamine, ergotamine, ergoloid mesylates, methysergide, or ergot-type medication - do not take within 24 hours of taking rizatriptan -feverfew -MAOIs like Carbex, Eldepryl, Marplan, Nardil, and Parnate - do not take rizatriptan within 2 weeks of stopping MAOI therapy. -other migraine medicines like almotriptan, eletriptan, naratriptan, sumatriptan, zolmitriptan - do not take within 24 hours of taking rizatriptan -tryptophan This medicine may also interact with the following medications: -medicines for mental depression, anxiety or mood problems -propranolol This list may not describe all possible interactions. Give your health care provider a list of all the medicines, herbs, non-prescription drugs, or dietary supplements you use. Also tell them if you smoke, drink alcohol, or use illegal drugs. Some items may interact with your medicine. What should I watch for while using this medicine? Only take this medicine for a migraine headache. Take it if you get warning symptoms or at the start of a migraine attack. It is not for regular use to prevent migraine attacks. You may get drowsy or dizzy. Do not drive, use machinery,  or do anything that needs mental alertness until you know how this medicine affects you. To reduce dizzy or fainting spells, do not sit or stand up quickly, especially if you  are an older patient. Alcohol can increase drowsiness, dizziness and flushing. Avoid alcoholic drinks. Smoking cigarettes may increase the risk of heart-related side effects from using this medicine. If you take migraine medicines for 10 or more days a month, your migraines may get worse. Keep a diary of headache days and medicine use. Contact your healthcare professional if your migraine attacks occur more frequently. What side effects may I notice from receiving this medicine? Side effects that you should report to your doctor or health care professional as soon as possible: -allergic reactions like skin rash, itching or hives, swelling of the face, lips, or tongue -fast, slow, or irregular heart beat -increased or decreased blood pressure -seizures -severe stomach pain and cramping, bloody diarrhea -signs and symptoms of a blood clot such as breathing problems; changes in vision; chest pain; severe, sudden headache; pain, swelling, warmth in the leg; trouble speaking; sudden numbness or weakness of the face, arm or leg -tingling, pain, or numbness in the face, hands, or feet Side effects that usually do not require medical attention (report to your doctor or health care professional if they continue or are bothersome): -drowsiness -dry mouth -feeling warm, flushing, or redness of the face -headache -muscle cramps, pain -nausea, vomiting -unusually weak or tired This list may not describe all possible side effects. Call your doctor for medical advice about side effects. You may report side effects to FDA at 1-800-FDA-1088. Where should I keep my medicine? Keep out of the reach of children. Store at room temperature between 15 and 30 degrees C (59 and 86 degrees F). Keep container tightly closed. Throw away any unused medicine after the expiration date. NOTE: This sheet is a summary. It may not cover all possible information. If you have questions about this medicine, talk to your doctor,  pharmacist, or health care provider.  2018 Elsevier/Gold Standard (2013-01-15 10:16:39)

## 2017-03-16 ENCOUNTER — Other Ambulatory Visit: Payer: Self-pay | Admitting: Pharmacist

## 2017-04-04 MED FILL — TIVICAY 50 MG TABLET: 50 | 30 days supply | Qty: 30 | Fill #7

## 2017-04-04 MED FILL — DESCOVY 200-25 MG TABS: 200-25 | 30 days supply | Qty: 30 | Fill #7

## 2017-04-04 MED FILL — MINOCYCLINE 100 MG CAPSULE: 100 | 30 days supply | Qty: 60 | Fill #5

## 2017-05-17 MED FILL — DESCOVY 200-25 MG TABS: 200-25 | 30 days supply | Qty: 30 | Fill #8

## 2017-05-17 MED FILL — MINOCYCLINE 100 MG CAPSULE: 100 | 30 days supply | Qty: 60 | Fill #6

## 2017-05-17 MED FILL — TIVICAY 50 MG TABLET: 50 | 30 days supply | Qty: 30 | Fill #8

## 2017-06-09 ENCOUNTER — Other Ambulatory Visit: Payer: Self-pay | Admitting: Pharmacist

## 2017-06-14 MED FILL — MINOCYCLINE 100 MG CAPSULE: 100 | 30 days supply | Qty: 60 | Fill #7

## 2017-06-14 MED FILL — DESCOVY 200-25 MG TABS: 200-25 | 30 days supply | Qty: 30 | Fill #9

## 2017-06-14 MED FILL — TIVICAY 50 MG TABLET: 50 | 30 days supply | Qty: 30 | Fill #9

## 2017-06-22 ENCOUNTER — Telehealth: Payer: Self-pay | Admitting: *Deleted

## 2017-06-22 NOTE — Telephone Encounter (Signed)
[employer] spoke with Selena BattenKim. Discussed document received in mail that Gabapentin 300 mg is on the patient's formulary, however the QL is 90 capsules for 30 days. The patient's prescription is for 90 capsules with 11 refills. I confirmed this with Selena BattenKim and she stated that there was no problem and that the patient can only receive 90 every 30 days. Patient eligible for a refill today per Selena BattenKim. Called ended no further concerns.

## 2017-07-06 ENCOUNTER — Other Ambulatory Visit (HOSPITAL_COMMUNITY)
Admission: RE | Admit: 2017-07-06 | Discharge: 2017-07-06 | Disposition: A | Discharge status: Home / Self Care | Payer: Medicare Other | Source: Ambulatory Visit | Attending: Infectious Disease | Admitting: Infectious Disease

## 2017-07-06 ENCOUNTER — Other Ambulatory Visit: Payer: Medicare Other

## 2017-07-06 DIAGNOSIS — L709 Acne, unspecified: Secondary | ICD-10-CM

## 2017-07-06 DIAGNOSIS — B20 Human immunodeficiency virus [HIV] disease: Secondary | ICD-10-CM

## 2017-07-06 DIAGNOSIS — Z79899 Other long term (current) drug therapy: Secondary | ICD-10-CM

## 2017-07-06 DIAGNOSIS — Z113 Encounter for screening for infections with a predominantly sexual mode of transmission: Secondary | ICD-10-CM

## 2017-07-06 NOTE — Addendum Note (Signed)
Addended by: ABBITT, KATRINA F on: 07/06/2017 08:24 AM ° ° Modules accepted: Orders ° °

## 2017-07-06 NOTE — Addendum Note (Signed)
Addended byDoristine Devoid: ABBITT, KATRINA F on: 07/06/2017 08:24 AM   Modules accepted: Orders

## 2017-07-06 NOTE — Addendum Note (Signed)
Addended byDoristine Devoid: ABBITT, KATRINA F on: 07/06/2017 08:23 AM   Modules accepted: Orders

## 2017-07-07 LAB — LIPID PANEL
CHOL/HDL RATIO: 4.5 (calc) (ref <5.0)
Cholesterol: 188 mg/dL (ref <200)
HDL: 42 mg/dL — AB (ref >50)
LDL Cholesterol (Calc): 124 mg/dL (calc) — ABNORMAL HIGH
NON-HDL CHOLESTEROL (CALC): 146 mg/dL — AB (ref <130)
Triglycerides: 110 mg/dL (ref <150)

## 2017-07-07 LAB — CBC WITH DIFFERENTIAL/PLATELET
BASOS ABS: 28 {cells}/uL (ref 0–200)
Basophils Relative: 0.5 %
EOS PCT: 2.1 %
Eosinophils Absolute: 118 cells/uL (ref 15–500)
HEMATOCRIT: 36.2 % (ref 35.0–45.0)
Hemoglobin: 12.3 g/dL (ref 11.7–15.5)
LYMPHS ABS: 1826 {cells}/uL (ref 850–3900)
MCH: 31.4 pg (ref 27.0–33.0)
MCHC: 34 g/dL (ref 32.0–36.0)
MCV: 92.3 fL (ref 80.0–100.0)
MONOS PCT: 3.4 %
MPV: 9.5 fL (ref 7.5–12.5)
NEUTROS PCT: 61.4 %
Neutro Abs: 3438 cells/uL (ref 1500–7800)
PLATELETS: 181 10*3/uL (ref 140–400)
RBC: 3.92 10*6/uL (ref 3.80–5.10)
RDW: 11.8 % (ref 11.0–15.0)
TOTAL LYMPHOCYTE: 32.6 %
WBC mixed population: 190 cells/uL — ABNORMAL LOW (ref 200–950)
WBC: 5.6 10*3/uL (ref 3.8–10.8)

## 2017-07-07 LAB — COMPLETE METABOLIC PANEL WITH GFR
AG Ratio: 1.8 (calc) (ref 1.0–2.5)
ALKALINE PHOSPHATASE (APISO): 79 U/L (ref 33–115)
ALT: 11 U/L (ref 6–29)
AST: 15 U/L (ref 10–30)
Albumin: 4.4 g/dL (ref 3.6–5.1)
BUN: 10 mg/dL (ref 7–25)
CALCIUM: 9.3 mg/dL (ref 8.6–10.2)
CO2: 29 mmol/L (ref 20–32)
CREATININE: 0.73 mg/dL (ref 0.50–1.10)
Chloride: 104 mmol/L (ref 98–110)
GFR, Est African American: 122 mL/min/{1.73_m2} (ref >=60)
GFR, Est Non African American: 105 mL/min/{1.73_m2} (ref >=60)
GLOBULIN: 2.4 g/dL (ref 1.9–3.7)
Glucose, Bld: 131 mg/dL — ABNORMAL HIGH (ref 65–99)
POTASSIUM: 4.3 mmol/L (ref 3.5–5.3)
SODIUM: 141 mmol/L (ref 135–146)
Total Bilirubin: 0.5 mg/dL (ref 0.2–1.2)
Total Protein: 6.8 g/dL (ref 6.1–8.1)

## 2017-07-07 LAB — URINE CYTOLOGY ANCILLARY ONLY
CHLAMYDIA, DNA PROBE: NEGATIVE
NEISSERIA GONORRHEA: NEGATIVE

## 2017-07-07 LAB — T-HELPER CELL (CD4) - (RCID CLINIC ONLY)
CD4 % Helper T Cell: 45 % (ref 33–55)
CD4 T Cell Abs: 920 /uL (ref 400–2700)

## 2017-07-07 LAB — RPR: RPR: NONREACTIVE

## 2017-07-08 LAB — HIV-1 RNA QUANT-NO REFLEX-BLD
HIV 1 RNA Quant: <20 copies/mL
HIV-1 RNA Quant, Log: <1.3 Log copies/mL

## 2017-07-13 HISTORY — PX: LAPAROSCOPIC ASSISTED VAGINAL HYSTERECTOMY: SHX5398

## 2017-07-20 ENCOUNTER — Ambulatory Visit: Payer: Medicare Other | Admitting: Infectious Disease

## 2017-07-24 ENCOUNTER — Ambulatory Visit (INDEPENDENT_AMBULATORY_CARE_PROVIDER_SITE_OTHER): Payer: Medicare Other | Admitting: Infectious Disease

## 2017-07-24 ENCOUNTER — Other Ambulatory Visit: Payer: Self-pay | Admitting: Pharmacist

## 2017-07-24 ENCOUNTER — Encounter: Payer: Self-pay | Admitting: Infectious Disease

## 2017-07-24 VITALS — BP 114/79 | HR 97 | Temp 98.4°F | Ht 66.0 in | Wt 104.0 lb

## 2017-07-24 DIAGNOSIS — F339 Major depressive disorder, recurrent, unspecified: Secondary | ICD-10-CM

## 2017-07-24 DIAGNOSIS — B20 Human immunodeficiency virus [HIV] disease: Secondary | ICD-10-CM

## 2017-07-24 DIAGNOSIS — G43809 Other migraine, not intractable, without status migrainosus: Secondary | ICD-10-CM

## 2017-07-24 DIAGNOSIS — Z79899 Other long term (current) drug therapy: Secondary | ICD-10-CM | POA: Diagnosis not present

## 2017-07-24 DIAGNOSIS — R569 Unspecified convulsions: Secondary | ICD-10-CM | POA: Diagnosis not present

## 2017-07-24 DIAGNOSIS — G8929 Other chronic pain: Secondary | ICD-10-CM

## 2017-07-24 DIAGNOSIS — R519 Headache, unspecified: Secondary | ICD-10-CM

## 2017-07-24 DIAGNOSIS — R51 Headache: Secondary | ICD-10-CM

## 2017-07-24 DIAGNOSIS — Z113 Encounter for screening for infections with a predominantly sexual mode of transmission: Secondary | ICD-10-CM

## 2017-07-24 MED ORDER — EMTRICITABINE-TENOFOVIR AF 200-25 MG PO TABS: 1.0000 | ORAL_TABLET | Freq: Every day | ORAL | 11 refills | Status: DC

## 2017-07-24 MED ORDER — DOLUTEGRAVIR SODIUM 50 MG PO TABS: 50.0000 mg | ORAL_TABLET | Freq: Every day | ORAL | 11 refills | Status: DC

## 2017-07-24 MED FILL — MINOCYCLINE 100 MG CAPSULE: 100 | 30 days supply | Qty: 60 | Fill #8

## 2017-07-24 MED FILL — DESCOVY 200-25 MG TABS: 200-25 | 30 days supply | Qty: 30 | Fill #10

## 2017-07-24 MED FILL — TIVICAY 50 MG TABLET: 50 | 30 days supply | Qty: 30 | Fill #10

## 2017-07-24 NOTE — Progress Notes (Signed)
Chief complaint: Follow-up for HIV on medications   Subjective:    Patient ID: [name], female    DOB: June 27, 1980, 37 y.o.   MRN: [id number]  HPI  37 year old Caucasian lady with HIV infection relatively recently diagnosed and started on anti-retroviral indications in the form of TIVICAY and Epivir via the AIDS clinical trial group and now Tivicay and Descovy.  She is maintained on suboxone for hx of opiate addiction and IVDU in past.  She is seeing Neurology for seizure disorder and migraines.  She is seeing Endocrine at [facility] for hyperthyroidism.  She just had hysterectomy.   Lab Results  Component Value Date   HIV1RNAQUANT <20 NOT DETECTED 07/06/2017   HIV1RNAQUANT <20 NOT DETECTED 01/17/2017   HIV1RNAQUANT <20 NOT DETECTED 07/14/2016     Lab Results  Component Value Date   CD4TABS 920 07/06/2017   CD4TABS 940 01/17/2017   CD4TABS 1,010 07/14/2016      Past Medical History:  Diagnosis Date  . Acne 07/28/2016  . Anxiety   . Arthritis 2000   Right wrist/hand, prior fracture  . Chronic headaches 2005   Migraines  . Chronic headaches 05/31/2003   Migraines  . Depression 2001  . Gallstones   . 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 HIV infection (HCC) 2016  . Mold exposure 01/28/2016  . Nausea with vomiting 01/28/2016  . PAC (premature atrial contraction) 06/24/2015  . Pneumonia   . Seizures (HCC)   . Sleep apnea   . Somatization disorder 01/28/2016    Past Surgical History:  Procedure Laterality Date  . CHOLECYSTECTOMY    . FRACTURE SURGERY     2-right hand  . ROTATOR CUFF REPAIR Right 2000   repair frozen shoulder 2001  . URETER SURGERY     Placement and removal     Family History  Problem Relation Age of Onset  . Diabetes Maternal Grandfather   . Heart disease Maternal Grandfather   . Breast cancer Mother   . Seizures Neg Hx       Social History   Socioeconomic History  . Marital  status: Legally Separated    Spouse name: Jonny Ruiz  . Number of children: 2  . Years of education: 12+  . Highest education level: Not on file  Social Needs  . Financial resource strain: Not on file  . Food insecurity - worry: Not on file  . Food insecurity - inability: Not on file  . Transportation needs - medical: Not on file  . Transportation needs - non-medical: Not on file  Occupational History  . Occupation: Unemployed  Tobacco Use  . Smoking status: Former Smoker    Packs/day: 0.80    Years: 24.00    Pack years: 19.20    Types: Cigarettes    Last attempt to quit: 02/16/2017    Years since quitting: 0.4  . Smokeless tobacco: Never Used  . Tobacco comment: would like to quit/Rx for nicorrette gum  Substance and Sexual Activity  . Alcohol use: No    Alcohol/week: 0.0 oz  . Drug use: Yes    Frequency: 7.0 times per week    Types: Marijuana    Comment: subutex  . Sexual activity: Not Currently    Partners: Male    Comment: declined condoms  Other Topics Concern  . Not on file  Social History Narrative   Lives at home with kids   Caffeine use: Drinks  coffee (1 cup per day)   Right-handed    Allergies  Allergen Reactions  . Robinul [Glycopyrrolate] Other (See Comments)    Urinary retention     Current Outpatient Medications:  .  albuterol (PROVENTIL HFA;VENTOLIN HFA) 108 (90 Base) MCG/ACT inhaler, Inhale into the lungs., Disp: , Rfl:  .  baclofen (LIORESAL) 10 MG tablet, Take 1 tablet (10 mg total) by mouth 3 (three) times daily., Disp: 30 each, Rfl: 11 .  buprenorphine (SUBUTEX) 8 MG SUBL SL tablet, Place 8 mg under the tongue 3 (three) times daily. , Disp: , Rfl:  .  dolutegravir (TIVICAY) 50 MG tablet, Take 1 tablet (50 mg total) by mouth daily., Disp: 30 tablet, Rfl: 11 .  emtricitabine-tenofovir AF (DESCOVY) 200-25 MG tablet, Take 1 tablet by mouth daily., Disp: 30 tablet, Rfl: 11 .  gabapentin (NEURONTIN) 300 MG capsule, Take 1 capsule (300 mg total) by mouth 3  (three) times daily. Take 1-2 tablets up to three times a day for trigeminal neuralgia., Disp: 90 capsule, Rfl: 11 .  lamoTRIgine (LAMICTAL) 200 MG tablet, Take 1 tablet (200 mg total) by mouth 2 (two) times daily., Disp: 60 tablet, Rfl: 11 .  methimazole (TAPAZOLE) 5 MG tablet, Take 5 mg by mouth., Disp: , Rfl:  .  minocycline (MINOCIN) 100 MG capsule, Take 1 capsule (100 mg total) by mouth 2 (two) times daily., Disp: 60 capsule, Rfl: 11 .  naproxen (NAPROSYN) 500 MG tablet, Take 1 tablet (500 mg total) by mouth 2 (two) times daily with a meal., Disp: 60 tablet, Rfl: 1 .  ondansetron (ZOFRAN-ODT) 4 MG disintegrating tablet, Take 1 tablet (4 mg total) by mouth every 8 (eight) hours as needed for nausea., Disp: 60 tablet, Rfl: 11 .  propranolol (INDERAL) 40 MG tablet, , Disp: , Rfl:  .  rizatriptan (MAXALT) 10 MG tablet, Take 1 tablet (10 mg total) by mouth as needed for migraine. May repeat in 2 hours if needed, Disp: 10 tablet, Rfl: 11 .  traZODone (DESYREL) 100 MG tablet, Take 200 mg by mouth at bedtime as needed., Disp: , Rfl:  .  traZODone (DESYREL) 100 MG tablet, Take 100 mg by mouth., Disp: , Rfl:     Review of Systems  Constitutional: Negative for activity change, chills, diaphoresis, fatigue and unexpected weight change.  HENT: Negative for rhinorrhea, sinus pressure, sneezing and sore throat.   Eyes: Negative for photophobia and visual disturbance.  Respiratory: Negative for chest tightness and wheezing.   Cardiovascular: Negative for chest pain, palpitations and leg swelling.  Gastrointestinal: Negative for anal bleeding and blood in stool.  Genitourinary: Negative for difficulty urinating and flank pain.  Musculoskeletal: Negative for back pain, gait problem and joint swelling.  Skin: Negative for color change, pallor and wound.  Neurological: Negative for dizziness, tremors, seizures, weakness and light-headedness.  Hematological: Negative for adenopathy. Does not bruise/bleed  easily.  Psychiatric/Behavioral: Negative for agitation, behavioral problems, confusion, decreased concentration and sleep disturbance.       Objective:   Physical Exam  Constitutional: She is oriented to person, place, and time. She appears well-developed and well-nourished. No distress.  HENT:  Head: Normocephalic and atraumatic.  Mouth/Throat: No oropharyngeal exudate.  Eyes: Conjunctivae and EOM are normal. No scleral icterus.  Neck: Normal range of motion. Neck supple.  Cardiovascular: Normal rate and regular rhythm.  Pulmonary/Chest: Effort normal. No respiratory distress. She has no wheezes.  Abdominal: Soft. She exhibits no distension, no pulsatile liver, no fluid wave and no ascites.  There is no rigidity, no guarding, no CVA tenderness, no tenderness at McBurney's point and negative Murphy's sign.  Musculoskeletal: She exhibits no edema or tenderness.  Neurological: She is alert and oriented to person, place, and time. She exhibits normal muscle tone. Coordination normal.  Skin: Skin is warm and dry. No rash noted. She is not diaphoretic. No erythema. No pallor.  Psychiatric: She has a normal mood and affect. Her behavior is normal. Judgment and thought content normal.  Nursing note and vitals reviewed.         Assessment & Plan:  [id number]   Tivicay and Descovy  Perfect control, Return to clinic in one year Opioid maintenance being seen by clinic here in town.  Depression schizoaffective disorder: seeing psychology and primary care  Seizures: on AEDs (Lamictal) and they do not appear to  interact with her TIVICAY. He is being followed by neurology  Hyperthyroidism: followed at [facility] and getting labs later today  I spent greater than 25 minutes with the patient including greater than 50% of time in face to face counsel of the patient re nature of perfect virological suppression which she has been accomplishing and how this leads to protection of her immune  system and thereby qualitatively and quantitatively a life nearly identical to someone without HIV, including the concept of "Undetectable = untransmissable" also re STR options available now and in the future  and in coordination of her care.

## 2017-08-24 MED FILL — DESCOVY 200-25 MG TABS: 200-25 | 30 days supply | Qty: 30 | Fill #0

## 2017-08-24 MED FILL — TIVICAY 50 MG TABLET: 50 | 30 days supply | Qty: 30 | Fill #0

## 2017-09-13 ENCOUNTER — Ambulatory Visit (INDEPENDENT_AMBULATORY_CARE_PROVIDER_SITE_OTHER): Payer: Medicare Other | Admitting: Neurology

## 2017-09-13 ENCOUNTER — Encounter: Payer: Self-pay | Admitting: Neurology

## 2017-09-13 VITALS — BP 109/72 | HR 72 | Ht 66.0 in | Wt 103.8 lb

## 2017-09-13 DIAGNOSIS — R110 Nausea: Secondary | ICD-10-CM

## 2017-09-13 DIAGNOSIS — R569 Unspecified convulsions: Secondary | ICD-10-CM

## 2017-09-13 DIAGNOSIS — G43009 Migraine without aura, not intractable, without status migrainosus: Secondary | ICD-10-CM | POA: Diagnosis not present

## 2017-09-13 DIAGNOSIS — G500 Trigeminal neuralgia: Secondary | ICD-10-CM

## 2017-09-13 MED ORDER — LAMOTRIGINE 200 MG PO TABS: 200.0000 mg | ORAL_TABLET | Freq: Two times a day (BID) | ORAL | 11 refills | Status: DC

## 2017-09-13 MED ORDER — GABAPENTIN 300 MG PO CAPS: 300.0000 mg | ORAL_CAPSULE | Freq: Three times a day (TID) | ORAL | 11 refills | Status: DC

## 2017-09-13 MED ORDER — RIZATRIPTAN BENZOATE 10 MG PO TABS: 10.0000 mg | ORAL_TABLET | ORAL | 11 refills | Status: DC | PRN

## 2017-09-13 MED ORDER — ONDANSETRON 4 MG PO TBDP: 4.0000 mg | ORAL_TABLET | Freq: Three times a day (TID) | ORAL | 11 refills | Status: DC | PRN

## 2017-09-13 MED ORDER — PROPRANOLOL HCL 40 MG PO TABS: 40.0000 mg | ORAL_TABLET | Freq: Every day | ORAL | 11 refills | Status: DC | PRN

## 2017-09-13 MED ORDER — BACLOFEN 10 MG PO TABS: 10.0000 mg | ORAL_TABLET | Freq: Three times a day (TID) | ORAL | 11 refills | Status: DC
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
## 2017-09-27 MED FILL — DESCOVY 200-25 MG TABS: 200-25 | 30 days supply | Qty: 30 | Fill #1

## 2017-09-27 MED FILL — TIVICAY 50 MG TABLET: 50 | 30 days supply | Qty: 30 | Fill #1

## 2017-10-06 ENCOUNTER — Other Ambulatory Visit: Payer: Self-pay | Admitting: Neurology

## 2017-10-20 ENCOUNTER — Other Ambulatory Visit: Payer: Self-pay | Admitting: Pharmacist

## 2017-10-20 NOTE — Progress Notes (Signed)
Doing great on Tivicay and Descovy.  No issues or missed doses.

## 2017-10-24 MED FILL — TIVICAY 50 MG TABLET: 50 | 30 days supply | Qty: 30 | Fill #2

## 2017-10-24 MED FILL — DESCOVY 200-25 MG TABS: 200-25 | 30 days supply | Qty: 30 | Fill #2

## 2017-10-26 ENCOUNTER — Ambulatory Visit (INDEPENDENT_AMBULATORY_CARE_PROVIDER_SITE_OTHER): Payer: Medicare Other | Admitting: Neurology

## 2017-10-26 ENCOUNTER — Telehealth: Payer: Self-pay | Admitting: *Deleted

## 2017-10-26 ENCOUNTER — Ambulatory Visit
Admission: RE | Admit: 2017-10-26 | Discharge: 2017-10-26 | Disposition: A | Discharge status: Home / Self Care | Payer: Medicare Other | Source: Ambulatory Visit | Attending: Neurology | Admitting: Neurology

## 2017-10-26 DIAGNOSIS — M25541 Pain in joints of right hand: Secondary | ICD-10-CM

## 2017-10-26 DIAGNOSIS — R202 Paresthesia of skin: Secondary | ICD-10-CM

## 2017-10-26 DIAGNOSIS — Z0289 Encounter for other administrative examinations: Secondary | ICD-10-CM

## 2017-10-26 DIAGNOSIS — M79644 Pain in right finger(s): Secondary | ICD-10-CM

## 2017-10-26 DIAGNOSIS — S6991XA Unspecified injury of right wrist, hand and finger(s), initial encounter: Secondary | ICD-10-CM

## 2017-10-26 NOTE — Progress Notes (Signed)
See procedure note.

## 2017-10-26 NOTE — Telephone Encounter (Addendum)
Called pt and informed her that the hand xray is normal. She verbalized appreciation and understanding.   ----- Message from Anson Fret, MD sent at 10/26/2017 11:53 AM EDT ----- [name] is normal thanks

## 2017-10-26 NOTE — Progress Notes (Signed)
Right thumb numbness and pain, shooting pain, decreased range of motion, she can't pick up objects, hurts to extend flex at the MCP, continuous numbness may be distal damage to digital branches of the median nerve to the thumb. Will send to orthopaedic surgery for evaluation and also send for xrays today. Recommend wearing a hand brace. Will also send to PT for therapy and a wrist brace.   Orders Placed This Encounter  Procedures  . DG Hand Complete Right  . Ambulatory referral to Orthopedic Surgery  . Ambulatory referral to Physical Therapy   A total of 25 minutes was spent face-to-face with this patient. Over half this time was spent on counseling patient on the acute right thumb pain diagnosis and different diagnostic and therapeutic options, counseling and coordination of care, risks ans benefits of management, compliance, or risk factor reduction and education.  This does not include the time spent on performing the emg/ncs procedure.

## 2017-10-26 NOTE — Procedures (Signed)
      Full Name: Elmira Ziegler Gender: Female MRN #: [id number] Date of Birth: 04/23/1981    Visit Date: 10/26/2017 09:12 Age: 37 Years 1 Months Old Examining Physician: Zafiro Routson, MD   History: Right Thumb Pain and Numbness  Summary: EMG/NCS was performed on the right upper extremity.     Conclusion: This is a normal study   Gustav Knueppel  M.D.  [name] [address] [place], [place] [address] Tel: [phone] Fax: [phone]        MNC    Nerve / Sites Muscle Latency Ref. Amplitude Ref. Rel Amp Segments Distance Velocity Ref. Area    ms ms mV mV %  cm m/s m/s mVms  R Median - APB     Wrist APB 3.3 ?4.4 6.0 ?4.0 100 Wrist - APB 7   16.5     Upper arm APB 7.2  5.6  92.2 Upper arm - Wrist 21 53 ?49 15.3  R Ulnar - ADM     Wrist ADM 3.0 ?3.3 9.3 ?6.0 100 Wrist - ADM 7   27.5     B.Elbow ADM 6.4  8.8  94.4 B.Elbow - Wrist 20 58 ?49 26.5     A.Elbow ADM 8.1  8.8  100 A.Elbow - B.Elbow 10 60 ?49 26.4         A.Elbow - Wrist             SNC    Nerve / Sites Rec. Site Peak Lat Ref.  Amp Ref. Segments Distance Peak Diff Ref.    ms ms V V  cm ms ms  R Radial - Anatomical snuff box (Forearm)     Forearm Wrist 2.3 ?2.9 35 ?15 Forearm - Wrist 10    R Median, Ulnar - Transcarpal comparison     Median Palm Wrist 2.1 ?2.2 63 ?35 Median Palm - Wrist 8       Ulnar Palm Wrist 2.1 ?2.2 22 ?12 Ulnar Palm - Wrist 8          Median Palm - Ulnar Palm  0.1 ?0.4  R Median, Radial - Thumb comparison     Median Wrist Thumb 3.1  11  Median Wrist - Thumb 10       Radial Wrist Thumb 2.9  13  Radial Wrist - Thumb 10          Median Wrist - Radial Wrist  0.2 ?0.5  R Median - Orthodromic (Dig II, Mid palm)     Dig II Wrist 3.1 ?3.4 15 ?10 Dig II - Wrist 13    R Ulnar - Orthodromic, (Dig V, Mid palm)     Dig V Wrist 2.8 ?3.1 11 ?5 Dig V - Wrist 11                 F  Wave    Nerve F Lat Ref.   ms ms  R Ulnar - ADM 25.9 ?32.0       EMG full       EMG  Summary Table    Spontaneous MUAP Recruitment  Muscle IA Fib PSW Fasc Other Amp Dur. Poly Pattern  R. Deltoid Normal None None None _______ Normal Normal Normal Normal  R. Triceps brachii Normal None None None _______ Normal Normal Normal Normal  R. Pronator teres Normal None None None _______ Normal Normal Normal Normal  R. First dorsal interosseous Normal None None None _______ Normal Normal Normal Normal  R. Opponens pollicis   Normal None None None _______ Normal Normal Normal Normal

## 2017-10-26 NOTE — Progress Notes (Signed)
Full Name: [name] Gender: Female MRN #: [id number] Date of Birth: Nov 11, 1980    Visit Date: 10/26/2017 09:12 Age: 37 Years 1 Months Old Examining Physician: Naomie Dean, MD   History: Right Thumb Pain and Numbness  Summary: EMG/NCS was performed on the right upper extremity.     Conclusion: This is a normal study   Naomie Dean  M.D.  [facility] [address] [place], [place] [address] Tel: [phone] Fax: [phone]        [facility]    Nerve / Sites Muscle Latency Ref. Amplitude Ref. Rel Amp Segments Distance Velocity Ref. Area    ms ms mV mV %  cm m/s m/s mVms  R Median - APB     Wrist APB 3.3 ?4.4 6.0 ?4.0 100 Wrist - APB 7   16.5     Upper arm APB 7.2  5.6  92.2 Upper arm - Wrist 21 53 ?49 15.3  R Ulnar - ADM     Wrist ADM 3.0 ?3.3 9.3 ?6.0 100 Wrist - ADM 7   27.5     B.Elbow ADM 6.4  8.8  94.4 B.Elbow - Wrist 20 58 ?49 26.5     A.Elbow ADM 8.1  8.8  100 A.Elbow - B.Elbow 10 60 ?49 26.4         A.Elbow - Wrist             SNC    Nerve / Sites Rec. Site Peak Lat Ref.  Amp Ref. Segments Distance Peak Diff Ref.    ms ms V V  cm ms ms  R Radial - Anatomical snuff box (Forearm)     Forearm Wrist 2.3 ?2.9 35 ?15 Forearm - Wrist 10    R Median, Ulnar - Transcarpal comparison     Median Palm Wrist 2.1 ?2.2 63 ?35 Median Palm - Wrist 8       Ulnar Palm Wrist 2.1 ?2.2 22 ?12 Ulnar Palm - Wrist 8          Median Palm - Ulnar Palm  0.1 ?0.4  R Median, Radial - Thumb comparison     Median Wrist Thumb 3.1  11  Median Wrist - Thumb 10       Radial Wrist Thumb 2.9  13  Radial Wrist - Thumb 10          Median Wrist - Radial Wrist  0.2 ?0.5  R Median - Orthodromic (Dig II, Mid palm)     Dig II Wrist 3.1 ?3.4 15 ?10 Dig II - Wrist 13    R Ulnar - Orthodromic, (Dig V, Mid palm)     Dig V Wrist 2.8 ?3.1 11 ?5 Dig V - Wrist 72                 F  Wave    Nerve F Lat Ref.   ms ms  R Ulnar - ADM 25.9 ?32.0       EMG full       EMG  Summary Table    Spontaneous MUAP Recruitment  Muscle IA Fib PSW Fasc Other Amp Dur. Poly Pattern  R. Deltoid Normal None None None _______ Normal Normal Normal Normal  R. Triceps brachii Normal None None None _______ Normal Normal Normal Normal  R. Pronator teres Normal None None None _______ Normal Normal Normal Normal  R. First dorsal interosseous Normal None None None _______ Normal Normal Normal Normal  R. Opponens pollicis  Normal None None None _______ Normal Normal Normal Normal

## 2017-10-28 ENCOUNTER — Emergency Department (HOSPITAL_COMMUNITY)
Admission: EM | Admit: 2017-10-28 | Discharge: 2017-10-28 | Disposition: A | Discharge status: Home / Self Care | Payer: Medicare Other | Attending: Emergency Medicine | Admitting: Emergency Medicine

## 2017-10-28 ENCOUNTER — Other Ambulatory Visit: Payer: Self-pay

## 2017-10-28 ENCOUNTER — Encounter (HOSPITAL_COMMUNITY): Payer: Self-pay | Admitting: Emergency Medicine

## 2017-10-28 DIAGNOSIS — Z5321 Procedure and treatment not carried out due to patient leaving prior to being seen by health care provider: Secondary | ICD-10-CM | POA: Insufficient documentation

## 2017-10-28 DIAGNOSIS — L539 Erythematous condition, unspecified: Secondary | ICD-10-CM | POA: Diagnosis not present

## 2017-10-28 NOTE — ED Notes (Signed)
Called patient to a room and no answer. 

## 2017-10-28 NOTE — ED Notes (Signed)
Patient called to a room and no answer.

## 2017-10-28 NOTE — ED Triage Notes (Signed)
Patient is complaining that right foot middle toe that is having redness. Patient states she went to urgent care and they told her she had a blood infection. Patient states she started an antibiotic yesterday and that if it starts to streak up the foot then she needed to come to the ED.

## 2017-10-28 NOTE — ED Notes (Signed)
Called patient and no answer.

## 2017-10-30 NOTE — ED Notes (Signed)
Follow up call made  1123/10/31/19  s Adea Geisel rn

## 2017-11-09 ENCOUNTER — Encounter: Payer: Medicare Other | Admitting: Neurology

## 2017-11-23 MED FILL — TIVICAY 50 MG TABLET: 50 | 30 days supply | Qty: 30 | Fill #3

## 2017-11-23 MED FILL — DESCOVY 200-25 MG TABS: 200-25 | 30 days supply | Qty: 30 | Fill #3

## 2017-12-01 ENCOUNTER — Ambulatory Visit (INDEPENDENT_AMBULATORY_CARE_PROVIDER_SITE_OTHER): Payer: Medicare Other

## 2017-12-01 ENCOUNTER — Encounter (HOSPITAL_COMMUNITY): Payer: Self-pay | Admitting: Emergency Medicine

## 2017-12-01 ENCOUNTER — Ambulatory Visit (HOSPITAL_COMMUNITY)
Admission: EM | Admit: 2017-12-01 | Discharge: 2017-12-01 | Disposition: A | Discharge status: Home / Self Care | Payer: Medicare Other | Attending: Family Medicine | Admitting: Family Medicine

## 2017-12-01 DIAGNOSIS — M25562 Pain in left knee: Secondary | ICD-10-CM

## 2017-12-01 DIAGNOSIS — S83412A Sprain of medial collateral ligament of left knee, initial encounter: Secondary | ICD-10-CM

## 2017-12-01 DIAGNOSIS — S90811A Abrasion, right foot, initial encounter: Secondary | ICD-10-CM

## 2017-12-01 MED ORDER — NAPROXEN 375 MG PO TABS: 375.0000 mg | ORAL_TABLET | Freq: Two times a day (BID) | ORAL | 0 refills | Status: DC

## 2017-12-01 NOTE — ED Triage Notes (Signed)
Pt states she cut her R foot, unsure of mechanism, pt states her L knee hurts when she fell as well.

## 2017-12-01 NOTE — ED Provider Notes (Addendum)
[facility]   [id number] 12/01/17 Arrival Time: 1435  SUBJECTIVE: History from: patient. [name] is a 37 y.o. female complains of left knee pain that began this morning.  It started after her left leg slipped between two b[place].  Localizes the pain to the inside of knee.  Describes the pain as constant.  Denies alleviating factors. Symptoms are made worse with walking and at rest.  Denies similar symptoms in the past.  Complains of associated effusion.  Denies fever, chills, erythema, ecchymosis, weakness, numbness and tingling.      Patient also admits to scraping the bottom of her right foot during the fall.    ROS: As per HPI.  Past Medical History:  Diagnosis Date  . Acne 07/28/2016  . Anxiety   . Arthritis 2000   Right wrist/hand, prior fracture  . Choroidal nevus of both eyes   . Chronic headaches 2005   Migraines  . Chronic headaches 05/31/2003   Migraines  . Depression 2001  . Gallstones   . 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 HIV infection (HCC) 2016  . Mold exposure 01/28/2016  . Nausea with vomiting 01/28/2016  . PAC (premature atrial contraction) 06/24/2015  . Pneumonia   . Seizures (HCC)   . Sleep apnea   . Somatization disorder 01/28/2016   Past Surgical History:  Procedure Laterality Date  . CHOLECYSTECTOMY    . FRACTURE SURGERY     2-right hand  . HYSTERECTOMY ABDOMINAL WITH SALPINGECTOMY     also repaired muscles  . ROTATOR CUFF REPAIR Right 2000   repair frozen shoulder 2001  . URETER SURGERY     Placement and removal    Allergies  Allergen Reactions  . Glycopyrrolate Other (See Comments)    Urinary retention Urinary retention   No current facility-administered medications on file prior to encounter.    Current Outpatient Medications on File Prior to Encounter  Medication Sig Dispense Refill  . baclofen (LIORESAL) 10 MG tablet Take 1 tablet (10 mg total) by  mouth 3 (three) times daily. 30 each 11  . buprenorphine (SUBUTEX) 8 MG SUBL SL tablet Place 8 mg under the tongue 3 (three) times daily.     . dolutegravir (TIVICAY) 50 MG tablet Take 1 tablet (50 mg total) by mouth daily. 30 tablet 11  . emtricitabine-tenofovir AF (DESCOVY) 200-25 MG tablet Take 1 tablet by mouth daily. 30 tablet 11  . gabapentin (NEURONTIN) 300 MG capsule TAKE 1 CAPSULE BY MOUTH 3 TIMES DAILY. TAKE 1-2 TABLETS UP TO THREE TIMES A DAY FOR NEURALGIA. 270 capsule 4  . ibuprofen (ADVIL,MOTRIN) 800 MG tablet TAKE 1 TABLET BY MOUTH EVERY 8 HOURS AS NEEDED FOR UP TO 10 DAYS FOR MILD PAIN  0  . lamoTRIgine (LAMICTAL) 200 MG tablet Take 1 tablet (200 mg total) by mouth 2 (two) times daily. 60 tablet 11  . ondansetron (ZOFRAN-ODT) 4 MG disintegrating tablet Take 1 tablet (4 mg total) by mouth every 8 (eight) hours as needed for nausea. 60 tablet 11  . propranolol (INDERAL) 40 MG tablet Take 1 tablet (40 mg total) by mouth daily as needed. 30 tablet 11  . rizatriptan (MAXALT) 10 MG tablet Take 1 tablet (10 mg total) by mouth as needed for migraine. May repeat in 2 hours if needed 10 tablet 11  . traZODone (DESYREL) 100 MG tablet Take by mouth at bedtime as needed.     Marland Kitchen  tretinoin (RETIN-A) 0.05 % cream APPLY TOPICALLY EVERY DAY     Social History   Socioeconomic History  . Marital status: Legally Separated    Spouse name: Sally RuizJohn  . Number of children: 2  . Years of education: 12+  . Highest education level: Not on file  Occupational History  . Occupation: Unemployed  Social Needs  . Financial resource strain: Not on file  . Food insecurity:    Worry: Not on file    Inability: Not on file  . Transportation needs:    Medical: Not on file    Non-medical: Not on file  Tobacco Use  . Smoking status: Current Every Day Smoker    Packs/day: 0.50    Years: 24.00    Pack years: 12.00    Types: Cigarettes    Last attempt to quit: 02/16/2017    Years since quitting: 0.7  . Smokeless  tobacco: Never Used  . Tobacco comment: would like to quit/Rx for nicorrette gum  Substance and Sexual Activity  . Alcohol use: No    Alcohol/week: 0.0 oz  . Drug use: Yes    Frequency: 7.0 times per week    Types: Marijuana    Comment: subutex  . Sexual activity: Not Currently    Partners: Male    Comment: declined condoms  Lifestyle  . Physical activity:    Days per week: Not on file    Minutes per session: Not on file  . Stress: Not on file  Relationships  . Social connections:    Talks on phone: Not on file    Gets together: Not on file    Attends religious service: Not on file    Active member of club or organization: Not on file    Attends meetings of clubs or organizations: Not on file    Relationship status: Not on file  . Intimate partner violence:    Fear of current or ex partner: Not on file    Emotionally abused: Not on file    Physically abused: Not on file    Forced sexual activity: Not on file  Other Topics Concern  . Not on file  Social History Narrative   Lives at home with kids   Right-handed   Family History  Problem Relation Age of Onset  . Diabetes Maternal Grandfather   . Heart disease Maternal Grandfather   . Breast cancer Mother   . Seizures Neg Hx     OBJECTIVE:  Vitals:   12/01/17 1551  BP: (!) 148/89  Pulse: 74  Resp: 18  Temp: 98 F (36.7 C)  SpO2: 100%    General appearance: AOx3; in no acute distress.  Head: NCAT Lungs: CTA bilaterally Heart: RRR.  Clear S1 and S2 without murmur, gallops, or rubs.  Radial pulses 2+ bilaterally. Musculoskeletal: Left knee Inspection: Skin warm, dry, clear and intact without obvious erythema, or ecchymosis. Mild effusion Palpation: Tender about the medial aspect of the knee ROM: FROM active and passive Strength: 5/5 hip flexion, 5/5 knee abduction, 5/5 knee adduction, 5/5 knee flexion, 5/5 knee extension, 5/5 dorsiflexion, 5/5 plantar flexion Stability: Anterior/ posterior drawer  intact Tenderness with valgus stress Skin: warm and dry; Two linear abrasions localized to the plantar surface of right foot; mildly tender to palpation; not actively bleeding (see picture below) Neurologic: Antalgic gait; Sensation intact about the upper/ lower extremities Psychological: alert and cooperative; normal mood and affect      DIAGNOSTIC STUDIES:    CLINICAL DATA: Larey SeatFell  last night between 2 rocks twisting LEFT knee and bending at backwards, pain especially medially, swelling  EXAM: LEFT KNEE - COMPLETE 4+ VIEW  COMPARISON: None  FINDINGS: Osseous mineralization normal.  Joint spaces preserved.  No fracture, dislocation, or bone destruction.  No joint effusion.  IMPRESSION: Normal exam.   Electronically Signed By: Ulyses Southward M.D. On: 12/01/2017 16:43   X-rays negative for bony abnormalities including fracture, or dislocation.  No soft tissue swelling.    I have reviewed the x-rays myself and the radiologist interpretation. I am in agreement with the radiologist interpretation.    ASSESSMENT & PLAN:  1. Acute pain of left knee   2. Sprain of medial collateral ligament of left knee, initial encounter   3. Abrasion of right foot, initial encounter    Meds ordered this encounter  Medications  . naproxen (NAPROSYN) 375 MG tablet    Sig: Take 1 tablet (375 mg total) by mouth 2 (two) times daily.    Dispense:  20 tablet    Refill:  0    Order Specific Question:   Supervising Provider    Answer:   Isa Rankin [[id number]]   Orders Placed This Encounter  Procedures  . DG Knee Complete 4 Views Left    Standing Status:   Standing    Number of Occurrences:   1    Order Specific Question:   Reason for Exam (SYMPTOM  OR DIAGNOSIS REQUIRED)    Answer:   knee injury  . Apply knee sleeve    Standing Status:   Standing    Number of Occurrences:   1    Order Specific Question:   Laterality    Answer:   Left  . Apply dressing    Standing  Status:   Standing    Number of Occurrences:   1  . Crutches    Standing Status:   Standing    Number of Occurrences:   1    X-rays negative for fracture or dislocation Brace given Crutches given Continue conservative management of rest, ice, heat, and gentle stretches Take naproxen as needed for pain relief (may cause abdominal discomfort, ulcers, and GI bleeds avoid taking with other NSAIDs) Wash foot daily with warm water and mild soap Bandage applied Keep covered Use OTC antibiotic ointment daily Follow up with PCP or ortho if symptoms persist Return or go to the ER if you have any new or worsening symptoms (fever, chills, chest pain, abdominal pain, changes in bowel or bladder habits, pain radiating into lower legs, etc...)   Reviewed expectations re: course of current medical issues. Questions answered. Outlined signs and symptoms indicating need for more acute intervention. Patient verbalized understanding. After Visit Summary given.    Rennis Harding, PA-C 12/01/17 1728    Alvino Chapel [place], PA-C 12/02/17 1205

## 2017-12-01 NOTE — Discharge Instructions (Signed)
°  X-rays negative for fracture or dislocation Brace given Continue conservative management of rest, ice, heat, and gentle stretches Take naproxen as needed for pain relief (may cause abdominal discomfort, ulcers, and GI bleeds avoid taking with other NSAIDs) Wash foot daily with warm water and mild soap Keep covered Use OTC antibiotic ointment daily Follow up with PCP or ortho if symptoms persist Return or go to the ER if you have any new or worsening symptoms (fever, chills, chest pain, abdominal pain, changes in bowel or bladder habits, pain radiating into lower legs, etc...)

## 2017-12-01 NOTE — ED Notes (Signed)
nonadherent gauze and coban wrap applied to the right foot.

## 2017-12-19 MED FILL — TIVICAY 50 MG TABLET: 50 | 30 days supply | Qty: 30 | Fill #4

## 2017-12-19 MED FILL — DESCOVY 200-25 MG TABS: 200-25 | 30 days supply | Qty: 30 | Fill #4

## 2018-01-15 MED FILL — DESCOVY 200-25 MG TABS: 200-25 | 30 days supply | Qty: 30 | Fill #5

## 2018-01-15 MED FILL — TIVICAY 50 MG TABLET: 50 | 30 days supply | Qty: 30 | Fill #5

## 2018-01-16 ENCOUNTER — Encounter: Payer: Self-pay | Admitting: Neurology

## 2018-01-16 ENCOUNTER — Ambulatory Visit (INDEPENDENT_AMBULATORY_CARE_PROVIDER_SITE_OTHER): Payer: Medicare Other | Admitting: Neurology

## 2018-01-16 VITALS — BP 110/70 | HR 74 | Ht 66.0 in | Wt 101.0 lb

## 2018-01-16 DIAGNOSIS — R112 Nausea with vomiting, unspecified: Secondary | ICD-10-CM

## 2018-01-16 DIAGNOSIS — R634 Abnormal weight loss: Secondary | ICD-10-CM

## 2018-01-16 DIAGNOSIS — G43009 Migraine without aura, not intractable, without status migrainosus: Secondary | ICD-10-CM

## 2018-01-16 DIAGNOSIS — G40009 Localization-related (focal) (partial) idiopathic epilepsy and epileptic syndromes with seizures of localized onset, not intractable, without status epilepticus: Secondary | ICD-10-CM | POA: Diagnosis not present

## 2018-01-16 DIAGNOSIS — R569 Unspecified convulsions: Secondary | ICD-10-CM | POA: Diagnosis not present

## 2018-01-16 DIAGNOSIS — R110 Nausea: Secondary | ICD-10-CM | POA: Diagnosis not present

## 2018-01-16 NOTE — Patient Instructions (Signed)
Vasovagal Syncope, Adult  Syncope, which is commonly known as fainting or passing out, is a temporary loss of consciousness. It occurs when the blood flow to the brain is reduced. Vasovagal syncope, also called neurocardiogenic syncope, is a fainting spell that happens when blood flow to the brain is reduced because of a sudden drop in heart rate and blood pressure.  Vasovagal syncope is usually harmless. However, you can get injured if you fall during a fainting spell.  What are the causes?  This condition is caused by a drop in heart rate and blood pressure, usually in response to a trigger. Many things and situations can trigger an episode, including:  · Pain.  · Fear.  · The sight of blood. This may occur during medical procedures, such as when blood is being drawn from a vein.  · Common activities, such as coughing, swallowing, stretching, or going to the bathroom.  · Emotional stress.  · Being in a confined space.  · Prolonged standing, especially in a warm environment.  · Lack of sleep or rest.  · Not eating for a long time.  · Not drinking enough liquids.  · Recent illness.  · Drinking alcohol.  · Taking drugs that affect blood pressure, such as marijuana, cocaine, opiates, or inhalants.    What are the signs or symptoms?  Before a fainting episode, you may:  · Feel dizzy or light-headed.  · Become pale.  · Sense that you are going to faint.  · Feel like the room is spinning.  · Only see directly ahead (tunnel vision).  · Feel sick to your stomach (nauseous).  · See spots.  · Slowly lose vision.  · Hear ringing in your ears.  · Have a headache.  · Feel warm and sweaty.  · Feel a sensation of pins and needles.    During the fainting spell, you may twitch or make jerky movements. Fainting spells usually last no longer than a few minutes before you wake up. If you get up too quickly before your body can recover, you may faint again.  How is this diagnosed?  This condition is diagnosed based on your symptoms,  your medical history, and a physical exam. Tests may be done to rule out other causes of fainting. Tests may include:  · Blood tests.  · Heart tests, such as an electrocardiogram (ECG), echocardiogram, or electrophysiology study.  · A test to check your response to changes in position (tilt table test).    How is this treated?  Usually, treatment is not needed for this condition. Your health care provider may suggest ways to help prevent fainting episodes. These may include:  · Drinking additional fluids if you are exposed to a trigger.  · Sitting or lying down if you notice signs that an episode is coming.    If your fainting spells continue, your health care provider may recommend that you:  · Take medicines to prevent fainting or to help reduce further episodes of fainting.  · Do certain exercises.  · Wear compression stockings.  · Have surgery to place a pacemaker in your body (rare).    Follow these instructions at home:  · Learn to identify the signs that an episode is coming.  · Sit or lie down at the first sign of a fainting spell. If you sit down, put your head down between your legs. If you lie down, swing your legs up in the air to increase blood flow to the brain.  ·   Avoid hot tubs and saunas.  · Avoid standing for a long time. If you have to stand for a long time, try:  ? Crossing your legs.  ? Flexing and stretching your leg muscles.  ? Squatting.  ? Moving your legs.  ? Bending over.  · Drink enough fluid to keep your urine clear or pale yellow.  · Make changes to your diet that your health care provider recommends. You may be told to:  ? Avoid caffeine.  ? Eat more salt.  · Take over-the-counter and prescription medicines only as told by your health care provider.  Contact a health care provider if:  · You continue to have fainting spells despite treatment.  · You faint more often despite treatment.  · You lose consciousness for more than a few minutes.  · You faint during or after exercising or  after being startled.  · You have twitching or jerky movements for longer than a few seconds during a fainting spell.  · You have an episode of twitching or jerky movements without fainting.  Get help right away if:  · A fainting spell leads to an injury or bleeding.  · You have new symptoms that occur with the fainting spells, such as:  ? Shortness of breath.  ? Chest pain.  ? Irregular heartbeat.  · You twitch or make jerky movements for more than 5 minutes.  · You twitch or make jerky movements during more than one fainting spell.  This information is not intended to replace advice given to you by your health care provider. Make sure you discuss any questions you have with your health care provider.  Document Released: 05/02/2012 Document Revised: 10/28/2015 Document Reviewed: 03/14/2015  [employer] Interactive Patient Education © 2018 [employer]
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
## 2018-01-17 DIAGNOSIS — G43009 Migraine without aura, not intractable, without status migrainosus: Secondary | ICD-10-CM | POA: Insufficient documentation

## 2018-01-17 MED ORDER — PROPRANOLOL HCL 40 MG PO TABS: 40.0000 mg | ORAL_TABLET | Freq: Every day | ORAL | 11 refills | Status: DC | PRN

## 2018-01-17 MED ORDER — LAMOTRIGINE 200 MG PO TABS: 200.0000 mg | ORAL_TABLET | Freq: Two times a day (BID) | ORAL | 11 refills | Status: DC

## 2018-01-17 MED ORDER — RIZATRIPTAN BENZOATE 10 MG PO TABS: 10.0000 mg | ORAL_TABLET | ORAL | 11 refills | Status: DC | PRN

## 2018-01-17 MED ORDER — ONDANSETRON 4 MG PO TBDP: 4.0000 mg | ORAL_TABLET | Freq: Three times a day (TID) | ORAL | 11 refills | Status: DC | PRN

## 2018-01-17 MED ORDER — BACLOFEN 10 MG PO TABS: 10.0000 mg | ORAL_TABLET | Freq: Three times a day (TID) | ORAL | 11 refills | Status: DC

## 2018-01-17 MED ORDER — GABAPENTIN 300 MG PO CAPS: ORAL_CAPSULE | ORAL | 4 refills | Status: DC

## 2018-02-07 HISTORY — PX: KNEE ARTHROSCOPY: SUR90

## 2018-02-12 MED FILL — TIVICAY 50 MG TABLET: 50 | 30 days supply | Qty: 30 | Fill #6

## 2018-02-12 MED FILL — DESCOVY 200-25 MG TABS: 200-25 | 30 days supply | Qty: 30 | Fill #6

## 2018-02-13 ENCOUNTER — Telehealth: Payer: Self-pay | Admitting: Neurology

## 2018-02-13 NOTE — Telephone Encounter (Signed)
Pt called stating for the past few weeks she has had off and on migraines, requesting a call to discuss. Stating medication prescribed or over the counter have not helped. Pt unsure what to if she needs to come in or try different medication

## 2018-02-14 NOTE — Telephone Encounter (Signed)
Called pt and LVM. Informed pt I would try to call again. Also informed her that on call doc is avail if needed over night. Otherwise she can call, open at 8 AM. Left office number in message.

## 2018-02-15 DIAGNOSIS — R251 Tremor, unspecified: Secondary | ICD-10-CM | POA: Insufficient documentation

## 2018-02-15 NOTE — Telephone Encounter (Signed)
Spoke with pt. She stated she was doing alright. She would like for Dr. Lucia GaskinsAhern to please call her. I advised her that I would send Dr. Lucia GaskinsAhern the request. Pt aware Dr. Lucia GaskinsAhern not in office at this time. She verbalized appreciation.

## 2018-02-19 NOTE — Telephone Encounter (Signed)
Spoke with pt. She was scheduled for tomorrow 9/24 @ 1:00 arrival 12:45. Pt appreciative.

## 2018-02-19 NOTE — Telephone Encounter (Signed)
Can you give her a work in spot this week?

## 2018-02-20 ENCOUNTER — Ambulatory Visit (INDEPENDENT_AMBULATORY_CARE_PROVIDER_SITE_OTHER): Payer: Medicare Other | Admitting: Neurology

## 2018-02-20 ENCOUNTER — Encounter: Payer: Self-pay | Admitting: Neurology

## 2018-02-20 ENCOUNTER — Other Ambulatory Visit: Payer: Self-pay | Admitting: Neurology

## 2018-02-20 ENCOUNTER — Ambulatory Visit: Payer: Self-pay | Admitting: Neurology

## 2018-02-20 VITALS — BP 105/63 | HR 57 | Ht 66.0 in | Wt 105.0 lb

## 2018-02-20 DIAGNOSIS — G43709 Chronic migraine without aura, not intractable, without status migrainosus: Secondary | ICD-10-CM

## 2018-02-20 MED ORDER — ERENUMAB-AOOE 140 MG/ML ~~LOC~~ SOAJ: 140.0000 mg | SUBCUTANEOUS | 11 refills | Status: DC

## 2018-02-20 MED ORDER — ERENUMAB-AOOE 70 MG/ML ~~LOC~~ SOAJ: 140.0000 mg | SUBCUTANEOUS | 0 refills | Status: DC

## 2018-02-20 NOTE — Patient Instructions (Signed)
Erenumab: Patient drug information Access [employer] here. Copyright 1978-2019 [employer] All rights reserved. (For additional information see "Erenumab: Drug information") Brand Names: [place]  Aimovig  Brand Names: [place]  Aimovig  What is this drug used for?   It is used to prevent migraine headaches.  What do I need to tell my doctor BEFORE I take this drug?   If you have an allergy to this drug or any part of this drug.   If you are allergic to any drugs like this one, any other drugs, foods, or other substances. Tell your doctor about the allergy and what signs you had, like rash; hives; itching; shortness of breath; wheezing; cough; swelling of face, lips, tongue, or throat; or any other signs.   This drug may interact with other drugs or health problems.   Tell your doctor and pharmacist about all of your drugs (prescription or OTC, natural products, vitamins) and health problems. You must check to make sure that it is safe for you to take this drug with all of your drugs and health problems. Do not start, stop, or change the dose of any drug without checking with your doctor.  What are some things I need to know or do while I take this drug?   Tell all of your health care providers that you take this drug. This includes your doctors, nurses, pharmacists, and dentists.   If you have a latex allergy, talk with your doctor.   Tell your doctor if you are pregnant or plan on getting pregnant. You will need to talk about the benefits and risks of using this drug while you are pregnant.   Tell your doctor if you are breast-feeding. You will need to talk about any risks to your baby.  What are some side effects that I need to call my doctor about right away?   WARNING/CAUTION: Even though it may be rare, some people may have very bad and sometimes deadly side effects when taking a drug. Tell your doctor or get medical help right away if you have any of the following signs or symptoms  that may be related to a very bad side effect:   Signs of an allergic reaction, like rash; hives; itching; red, swollen, blistered, or peeling skin with or without fever; wheezing; tightness in the chest or throat; trouble breathing, swallowing, or talking; unusual hoarseness; or swelling of the mouth, face, lips, tongue, or throat.  What are some other side effects of this drug?   All drugs may cause side effects. However, many people have no side effects or only have minor side effects. Call your doctor or get medical help if any of these side effects or any other side effects bother you or do not go away:   Redness or swelling where the shot is given.   Pain where the shot was given.   Constipation.   These are not all of the side effects that may occur. If you have questions about side effects, call your doctor. Call your doctor for medical advice about side effects.   You may report side effects to your national health agency.  How is this drug best taken?   Use this drug as ordered by your doctor. Read all information given to you. Follow all instructions closely.   It is given as a shot into the fatty part of the skin on the top of the thigh, belly area, or upper arm.   If you will be giving   yourself the shot, your doctor or nurse will teach you how to give the shot.   Follow how to use as you have been told by the doctor or read the package insert.   If stored in a refrigerator, let this drug come to room temperature before using it. Leave it at room temperature for at least 30 minutes. Do not heat this drug.   Protect from heat and sunlight.   Do not shake.   Do not give into skin that is irritated, bruised, red, infected, or scarred.   Do not use if the solution is cloudy, leaking, or has particles.   Do not use if solution changes color.   Throw away after using. Do not use the device more than 1 time.   Throw away needles in a needle/sharp disposal box. Do not reuse needles or  other items. When the box is full, follow all local rules for getting rid of it. Talk with a doctor or pharmacist if you have any questions.  What do I do if I miss a dose?   Take a missed dose as soon as you think about it.   After taking a missed dose, start a new schedule based on when the dose is taken.  How do I store and/or throw out this drug?   Store in a refrigerator. Do not freeze.   Store in the carton to protect from light.   Do not use if it has been frozen.   If you drop this drug on a hard surface, do not use it.   If needed, you may store at room temperature for up to 7 days. Write down the date you take this drug out of the refrigerator. If stored at room temperature and not used within 7 days, throw this drug away.   Do not put this drug back in the refrigerator after it has been stored at room temperature.   Keep all drugs in a safe place. Keep all drugs out of the reach of children and pets.   Throw away unused or expired drugs. Do not flush down a toilet or pour down a drain unless you are told to do so. Check with your pharmacist if you have questions about the best way to throw out drugs. There may be drug take-back programs in your area.  General drug facts   If your symptoms or health problems do not get better or if they become worse, call your doctor.   Do not share your drugs with others and do not take anyone else's drugs.   Keep a list of all your drugs (prescription, natural products, vitamins, OTC) with you. Give this list to your doctor.   Talk with the doctor before starting any new drug, including prescription or OTC, natural products, or vitamins.   Some drugs may have another patient information leaflet. If you have any questions about this drug, please talk with your doctor, nurse, pharmacist, or other health care provider.   If you think there has been an overdose, call your poison control center or get medical care right away. Be ready to tell or show  what was taken, how much, and when it happened.   
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
## 2018-02-21 ENCOUNTER — Telehealth: Payer: Self-pay

## 2018-02-21 NOTE — Telephone Encounter (Signed)
RN receive fax that Aimovig was denied.PA was needed. Rn call the listed pharmacy. They stated pt file the injection with [employer]. RN stated all we have is medicaid on file. The pharmacist gave pts listed information for pts [employer] insurance:  ID number is [id number]  Group number is [id number] Rx bind is [id number].

## 2018-02-21 NOTE — Telephone Encounter (Signed)
PA done on cover my meds for Aimovig with her [employer] insurance plan. Pt is using this medication with that plan. She has medicaid too. PA pending.

## 2018-02-22 NOTE — Telephone Encounter (Signed)
Medication: Aimovig PA Case Status: Approved, Coverage Starts on: 05/28/2017, Coverage Ends on: 05/29/2018. Questions? Contact [phone], and [phone]. Reference number is [id number].

## 2018-03-13 MED FILL — TIVICAY 50 MG TABLET: 50 | 30 days supply | Qty: 30 | Fill #7

## 2018-03-13 MED FILL — DESCOVY 200-25 MG TABS: 200-25 | 30 days supply | Qty: 30 | Fill #7

## 2018-03-30 DIAGNOSIS — M65311 Trigger thumb, right thumb: Secondary | ICD-10-CM

## 2018-03-30 HISTORY — DX: Trigger thumb, right thumb: M65.311

## 2018-04-06 MED FILL — DESCOVY 200-25 MG TABS: 200-25 | 30 days supply | Qty: 30 | Fill #8

## 2018-04-06 MED FILL — TIVICAY 50 MG TABLET: 50 | 30 days supply | Qty: 30 | Fill #8

## 2018-04-24 ENCOUNTER — Other Ambulatory Visit: Payer: Self-pay | Admitting: Orthopedic Surgery

## 2018-04-24 ENCOUNTER — Encounter (HOSPITAL_BASED_OUTPATIENT_CLINIC_OR_DEPARTMENT_OTHER): Payer: Self-pay | Admitting: *Deleted

## 2018-04-24 ENCOUNTER — Other Ambulatory Visit: Payer: Self-pay
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
## 2018-04-30 ENCOUNTER — Encounter (HOSPITAL_BASED_OUTPATIENT_CLINIC_OR_DEPARTMENT_OTHER)
Admission: RE | Disposition: A | Discharge status: Home / Self Care | Payer: Self-pay | Source: Ambulatory Visit | Attending: Orthopedic Surgery

## 2018-04-30 ENCOUNTER — Ambulatory Visit (HOSPITAL_BASED_OUTPATIENT_CLINIC_OR_DEPARTMENT_OTHER): Payer: Medicare Other | Admitting: Certified Registered Nurse Anesthetist

## 2018-04-30 ENCOUNTER — Ambulatory Visit (HOSPITAL_BASED_OUTPATIENT_CLINIC_OR_DEPARTMENT_OTHER)
Admission: RE | Admit: 2018-04-30 | Discharge: 2018-04-30 | Disposition: A | Discharge status: Home / Self Care | Payer: Medicare Other | Source: Ambulatory Visit | Attending: Orthopedic Surgery | Admitting: Orthopedic Surgery

## 2018-04-30 ENCOUNTER — Other Ambulatory Visit: Payer: Self-pay

## 2018-04-30 ENCOUNTER — Encounter (HOSPITAL_BASED_OUTPATIENT_CLINIC_OR_DEPARTMENT_OTHER): Payer: Self-pay

## 2018-04-30 DIAGNOSIS — B20 Human immunodeficiency virus [HIV] disease: Secondary | ICD-10-CM | POA: Diagnosis not present

## 2018-04-30 DIAGNOSIS — G629 Polyneuropathy, unspecified: Secondary | ICD-10-CM | POA: Insufficient documentation

## 2018-04-30 DIAGNOSIS — G500 Trigeminal neuralgia: Secondary | ICD-10-CM | POA: Diagnosis not present

## 2018-04-30 DIAGNOSIS — M65311 Trigger thumb, right thumb: Secondary | ICD-10-CM | POA: Diagnosis present

## 2018-04-30 DIAGNOSIS — F329 Major depressive disorder, single episode, unspecified: Secondary | ICD-10-CM | POA: Diagnosis not present

## 2018-04-30 DIAGNOSIS — G43909 Migraine, unspecified, not intractable, without status migrainosus: Secondary | ICD-10-CM | POA: Insufficient documentation

## 2018-04-30 DIAGNOSIS — F419 Anxiety disorder, unspecified: Secondary | ICD-10-CM | POA: Diagnosis not present

## 2018-04-30 DIAGNOSIS — F17210 Nicotine dependence, cigarettes, uncomplicated: Secondary | ICD-10-CM | POA: Diagnosis not present

## 2018-04-30 DIAGNOSIS — F209 Schizophrenia, unspecified: Secondary | ICD-10-CM | POA: Insufficient documentation

## 2018-04-30 HISTORY — DX: Trigger thumb, right thumb: M65.311

## 2018-04-30 HISTORY — DX: Personal history of other specified conditions: Z87.898

## 2018-04-30 HISTORY — DX: Trigeminal neuralgia: G50.0

## 2018-04-30 HISTORY — PX: TRIGGER FINGER RELEASE: SHX641

## 2018-04-30 HISTORY — DX: Personal history of other endocrine, nutritional and metabolic disease: Z86.39

## 2018-04-30 HISTORY — DX: Migraine, unspecified, not intractable, without status migrainosus: G43.909

## 2018-04-30 HISTORY — DX: Other psychoactive substance abuse, in remission: F19.11

## 2018-04-30 HISTORY — DX: Polyneuropathy, unspecified: G62.9

## 2018-04-30 SURGERY — RELEASE, A1 PULLEY, FOR TRIGGER FINGER
Anesthesia: Monitor Anesthesia Care | Site: Hand | Laterality: Right

## 2018-04-30 MED ORDER — BUPIVACAINE-EPINEPHRINE (PF) 0.25% -1:200000 IJ SOLN
INTRAMUSCULAR | Status: AC
Start: 2018-04-30 — End: ?
  Filled 2018-04-30: qty 90

## 2018-04-30 MED ORDER — LACTATED RINGERS IV SOLN
INTRAVENOUS | Status: DC
  Administered 2018-04-30: 10:00:00 via INTRAVENOUS

## 2018-04-30 MED ORDER — DEXAMETHASONE SODIUM PHOSPHATE 4 MG/ML IJ SOLN
INTRAMUSCULAR | Status: DC | PRN
  Administered 2018-04-30: 5 mg via INTRAVENOUS

## 2018-04-30 MED ORDER — LIDOCAINE-EPINEPHRINE 2 %-1:100000 IJ SOLN
INTRAMUSCULAR | Status: AC
  Filled 2018-04-30: qty 1

## 2018-04-30 MED ORDER — ONDANSETRON HCL 4 MG/2ML IJ SOLN
INTRAMUSCULAR | Status: AC
  Filled 2018-04-30: qty 2

## 2018-04-30 MED ORDER — MIDAZOLAM HCL 2 MG/2ML IJ SOLN
INTRAMUSCULAR | Status: AC
  Filled 2018-04-30: qty 2

## 2018-04-30 MED ORDER — OXYCODONE HCL 5 MG/5ML PO SOLN: 5.0000 mg | Freq: Once | ORAL | Status: DC | PRN

## 2018-04-30 MED ORDER — PROMETHAZINE HCL 25 MG/ML IJ SOLN: 6.2500 mg | INTRAMUSCULAR | Status: DC | PRN

## 2018-04-30 MED ORDER — SCOPOLAMINE 1 MG/3DAYS TD PT72: 1.0000 | MEDICATED_PATCH | Freq: Once | TRANSDERMAL | Status: DC | PRN

## 2018-04-30 MED ORDER — PROPOFOL 500 MG/50ML IV EMUL
INTRAVENOUS | Status: AC
Start: 2018-04-30 — End: ?
  Filled 2018-04-30: qty 50

## 2018-04-30 MED ORDER — HYDROMORPHONE HCL 1 MG/ML IJ SOLN: 0.2500 mg | INTRAMUSCULAR | Status: DC | PRN

## 2018-04-30 MED ORDER — FENTANYL CITRATE (PF) 100 MCG/2ML IJ SOLN: 50.0000 ug | INTRAMUSCULAR | Status: DC | PRN

## 2018-04-30 MED ORDER — LIDOCAINE HCL 2 % IJ SOLN
INTRAMUSCULAR | Status: AC
Start: 2018-04-30 — End: ?
  Filled 2018-04-30: qty 20

## 2018-04-30 MED ORDER — IBUPROFEN 200 MG PO TABS: 600.0000 mg | ORAL_TABLET | Freq: Four times a day (QID) | ORAL | 0 refills | Status: DC

## 2018-04-30 MED ORDER — OXYCODONE HCL 5 MG PO TABS: 5.0000 mg | ORAL_TABLET | Freq: Once | ORAL | Status: DC | PRN

## 2018-04-30 MED ORDER — LIDOCAINE 2% (20 MG/ML) 5 ML SYRINGE
INTRAMUSCULAR | Status: AC
Start: 2018-04-30 — End: ?
  Filled 2018-04-30: qty 5

## 2018-04-30 MED ORDER — LACTATED RINGERS IV SOLN: INTRAVENOUS | Status: DC

## 2018-04-30 MED ORDER — OXYCODONE HCL 5 MG PO TABS: 5.0000 mg | ORAL_TABLET | Freq: Four times a day (QID) | ORAL | 0 refills | Status: DC | PRN

## 2018-04-30 MED ORDER — FENTANYL CITRATE (PF) 100 MCG/2ML IJ SOLN
INTRAMUSCULAR | Status: AC
  Filled 2018-04-30: qty 2

## 2018-04-30 MED ORDER — BUPIVACAINE HCL (PF) 0.5 % IJ SOLN
INTRAMUSCULAR | Status: DC | PRN
  Administered 2018-04-30: 4 mL via INTRA_ARTICULAR

## 2018-04-30 MED ORDER — BUPIVACAINE HCL (PF) 0.5 % IJ SOLN
INTRAMUSCULAR | Status: AC
  Filled 2018-04-30: qty 90

## 2018-04-30 MED ORDER — ACETAMINOPHEN 325 MG PO TABS: 650.0000 mg | ORAL_TABLET | Freq: Four times a day (QID) | ORAL | Status: DC

## 2018-04-30 MED ORDER — PROPOFOL 10 MG/ML IV BOLUS
INTRAVENOUS | Status: DC | PRN
  Administered 2018-04-30 (×4): 20 mg via INTRAVENOUS

## 2018-04-30 MED ORDER — MIDAZOLAM HCL 5 MG/5ML IJ SOLN
INTRAMUSCULAR | Status: DC | PRN
  Administered 2018-04-30 (×2): 1 mg via INTRAVENOUS

## 2018-04-30 MED ORDER — CEFAZOLIN SODIUM-DEXTROSE 2-4 GM/100ML-% IV SOLN
2.0000 g | INTRAVENOUS | Status: AC
  Administered 2018-04-30: 2 g via INTRAVENOUS

## 2018-04-30 MED ORDER — LIDOCAINE HCL (CARDIAC) PF 100 MG/5ML IV SOSY
PREFILLED_SYRINGE | INTRAVENOUS | Status: DC | PRN
  Administered 2018-04-30: 50 mg via INTRAVENOUS

## 2018-04-30 MED ORDER — FENTANYL CITRATE (PF) 100 MCG/2ML IJ SOLN
INTRAMUSCULAR | Status: DC | PRN
  Administered 2018-04-30 (×2): 50 ug via INTRAVENOUS

## 2018-04-30 MED ORDER — LIDOCAINE-EPINEPHRINE 2 %-1:100000 IJ SOLN
INTRAMUSCULAR | Status: DC | PRN
  Administered 2018-04-30: 4 mL

## 2018-04-30 MED ORDER — MIDAZOLAM HCL 2 MG/2ML IJ SOLN: 1.0000 mg | INTRAMUSCULAR | Status: DC | PRN

## 2018-04-30 MED ORDER — ONDANSETRON HCL 4 MG/2ML IJ SOLN
INTRAMUSCULAR | Status: DC | PRN
  Administered 2018-04-30: 4 mg via INTRAVENOUS

## 2018-04-30 MED ORDER — CEFAZOLIN SODIUM-DEXTROSE 2-4 GM/100ML-% IV SOLN
INTRAVENOUS | Status: AC
  Filled 2018-04-30: qty 100

## 2018-04-30 MED ORDER — DEXAMETHASONE SODIUM PHOSPHATE 10 MG/ML IJ SOLN
INTRAMUSCULAR | Status: AC
  Filled 2018-04-30: qty 1

## 2018-04-30 SURGICAL SUPPLY — 36 items
BLADE SURG 15 STRL LF DISP TIS (BLADE) ×1 IMPLANT
BLADE SURG 15 STRL SS (BLADE) ×2
BNDG COHESIVE 1X5 TAN STRL LF (GAUZE/BANDAGES/DRESSINGS) ×3 IMPLANT
BNDG CONFORM 2 STRL LF (GAUZE/BANDAGES/DRESSINGS) ×3 IMPLANT
BNDG ESMARK 4X9 LF (GAUZE/BANDAGES/DRESSINGS) ×3 IMPLANT
CHLORAPREP W/TINT 26ML (MISCELLANEOUS) ×3 IMPLANT
COVER BACK TABLE 60X90IN (DRAPES) ×3 IMPLANT
COVER MAYO STAND STRL (DRAPES) ×3 IMPLANT
COVER WAND RF STERILE (DRAPES) IMPLANT
CUFF TOURNIQUET SINGLE 18IN (TOURNIQUET CUFF) ×3 IMPLANT
DRAPE EXTREMITY T 121X128X90 (DRAPE) ×3 IMPLANT
DRAPE SURG 17X23 STRL (DRAPES) ×3 IMPLANT
DRSG EMULSION OIL 3X3 NADH (GAUZE/BANDAGES/DRESSINGS) ×3 IMPLANT
GAUZE SPONGE 4X4 12PLY STRL LF (GAUZE/BANDAGES/DRESSINGS) ×3 IMPLANT
GLOVE BIO SURGEON STRL SZ7.5 (GLOVE) ×3 IMPLANT
GLOVE BIOGEL PI IND STRL 7.0 (GLOVE) ×2 IMPLANT
GLOVE BIOGEL PI IND STRL 8 (GLOVE) ×1 IMPLANT
GLOVE BIOGEL PI INDICATOR 7.0 (GLOVE) ×4
GLOVE BIOGEL PI INDICATOR 8 (GLOVE) ×2
GLOVE ECLIPSE 6.5 STRL STRAW (GLOVE) ×6 IMPLANT
GOWN STRL REUS W/ TWL LRG LVL3 (GOWN DISPOSABLE) ×2 IMPLANT
GOWN STRL REUS W/TWL LRG LVL3 (GOWN DISPOSABLE) ×4
GOWN STRL REUS W/TWL XL LVL3 (GOWN DISPOSABLE) ×3 IMPLANT
NDL SAFETY ECLIPSE 18X1.5 (NEEDLE) ×1 IMPLANT
NEEDLE HYPO 18GX1.5 SHARP (NEEDLE) ×2
NEEDLE HYPO 25X1 1.5 SAFETY (NEEDLE) ×3 IMPLANT
NS IRRIG 1000ML POUR BTL (IV SOLUTION) ×3 IMPLANT
PACK BASIN DAY SURGERY FS (CUSTOM PROCEDURE TRAY) ×3 IMPLANT
STOCKINETTE 6  STRL (DRAPES) ×2
STOCKINETTE 6 STRL (DRAPES) ×1 IMPLANT
SUT VICRYL RAPIDE 4/0 PS 2 (SUTURE) ×3 IMPLANT
SYR 10ML LL (SYRINGE) IMPLANT
SYR BULB 3OZ (MISCELLANEOUS) ×3 IMPLANT
TOWEL GREEN STERILE FF (TOWEL DISPOSABLE) ×3 IMPLANT
TOWEL OR NON WOVEN STRL DISP B (DISPOSABLE) IMPLANT
UNDERPAD 30X30 (UNDERPADS AND DIAPERS) ×3 IMPLANT

## 2018-04-30 NOTE — Transfer of Care (Signed)
Immediate Anesthesia Transfer of Care Note  Patient: [name]  Procedure(s) Performed: RIGHT THUMB TRIGGER RELEASE (Right Hand)  Patient Location: PACU  Anesthesia Type:MAC  Level of Consciousness: awake, alert  and oriented  Airway & Oxygen Therapy: Patient Spontanous Breathing and Patient connected to nasal cannula oxygen  Post-op Assessment: Report given to RN and Post -op Vital signs reviewed and stable  Post vital signs: Reviewed and stable  Last Vitals:  Vitals Value Taken Time  BP    Temp    Pulse    Resp    SpO2      Last Pain:  Vitals:   04/30/18 0915  TempSrc: Oral  PainSc: 4       Patients Stated Pain Goal: 4 (83/72/90 2111)  Complications: No apparent anesthesia complications

## 2018-04-30 NOTE — Interval H&P Note (Signed)
History and Physical Interval Note:  04/30/2018 9:29 AM  [name]  has presented today for surgery, with the diagnosis of RIGHT THUMB TRIGGER DIGIT  The various methods of treatment have been discussed with the patient and family. After consideration of risks, benefits and other options for treatment, the patient has consented to  Procedure(s): RIGHT THUMB TRIGGER RELEASE (Right) as a surgical intervention .  The patient's history has been reviewed, patient examined, no change in status, stable for surgery.  I have reviewed the patient's chart and labs.  Questions were answered to the patient's satisfaction.     Jodi Marbleavid A Jedrek Dinovo

## 2018-04-30 NOTE — Discharge Instructions (Signed)
°  Post Anesthesia Home Care Instructions  Activity: Get plenty of rest for the remainder of the day. A responsible individual must stay with you for 24 hours following the procedure.  For the next 24 hours, DO NOT: -Drive a car -Advertising copywriterperate machinery -Drink alcoholic beverages -Take any medication unless instructed by your physician -Make any legal decisions or sign important papers.  Meals: Start with liquid foods such as gelatin or soup. Progress to regular foods as tolerated. Avoid greasy, spicy, heavy foods. If nausea and/or vomiting occur, drink only clear liquids until the nausea and/or vomiting subsides. Call your physician if vomiting continues.  Special Instructions/Symptoms: Your throat may feel dry or sore from the anesthesia or the breathing tube placed in your throat during surgery. If this causes discomfort, gargle with warm salt water. The discomfort should disappear within 24 hours.  If you had a scopolamine patch placed behind your ear for the management of post- operative nausea and/or vomiting:  1. The medication in the patch is effective for 72 hours, after which it should be removed.  Wrap patch in a tissue and discard in the trash. Wash hands thoroughly with soap and water. 2. You may remove the patch earlier than 72 hours if you experience unpleasant side effects which may include dry mouth, dizziness or visual disturbances. 3. Avoid touching the patch. Wash your hands with soap and water after contact with the patch.   Discharge Instructions   You have a light dressing on your hand.  You may begin gentle motion of your fingers and hand immediately, but you should not do any heavy lifting or gripping.  Elevate your hand to reduce pain & swelling of the digits.  Ice over the operative site may be helpful to reduce pain & swelling.  DO NOT USE HEAT. Pain medicine has been prescribed for you.  Take Tylenol 650 mg and Ibuprofen 600 mg every 6 hours. Take the pain medicine  additionally for severe post operative pain as a rescue medicine. Leave the dressing in place until the third day after your surgery and then remove it, leaving it open to air.  After the bandage has been removed you may shower, regularly washing the incision and letting the water run over it, but not submerging it (no swimming, soaking it in dishwater, etc.) You may drive a car when you are off of prescription pain medications and can safely control your vehicle with both hands. We will address whether therapy will be required or not when you return to the office. You may have already made your follow-up appointment when we completed your preop visit.  If not, please call our office today or the next business day to make your return appointment for 10-15 days after surgery.   Please call [phone] during normal business hours or [phone] after hours for any problems. Including the following:  - excessive redness of the incisions - drainage for more than 4 days - fever of more than 101.5 F  *Please note that pain medications will not be refilled after hours or on weekends.

## 2018-04-30 NOTE — Anesthesia Preprocedure Evaluation (Signed)
Anesthesia Evaluation    Airway Mallampati: II  TM Distance: >3 FB Neck ROM: Full    Dental no notable dental hx.    Pulmonary Current Smoker,    Pulmonary exam normal breath sounds clear to auscultation       Cardiovascular Normal cardiovascular exam Rhythm:Regular Rate:Normal     Neuro/Psych  Headaches, Seizures -,  Anxiety Depression Schizophrenia    GI/Hepatic   Endo/Other    Renal/GU      Musculoskeletal  (+) Arthritis , Osteoarthritis,    Abdominal   Peds  Hematology  (+) HIV,   Anesthesia Other Findings   Reproductive/Obstetrics                             Anesthesia Physical Anesthesia Plan  ASA: III  Anesthesia Plan: MAC   Post-op Pain Management:    Induction: Intravenous  PONV Risk Score and Plan: 1 and Ondansetron  Airway Management Planned: Simple Face Mask  Additional Equipment:   Intra-op Plan:   Post-operative Plan:   Informed Consent: I have reviewed the patients History and Physical, chart, labs and discussed the procedure including the risks, benefits and alternatives for the proposed anesthesia with the patient or authorized representative who has indicated his/her understanding and acceptance.   Dental advisory given  Plan Discussed with: CRNA  Anesthesia Plan Comments:         Anesthesia Quick Evaluation

## 2018-04-30 NOTE — Op Note (Signed)
04/30/2018  9:29 AM  PATIENT:  [name]  37 y.o. female  PRE-OPERATIVE DIAGNOSIS:  RIGHT THUMB TRIGGER DIGIT  POST-OPERATIVE DIAGNOSIS:  Same  PROCEDURE:  Procedure(s): RIGHT THUMB TRIGGER RELEASE  SURGEON:  Surgeon(s): Milly Jakob, MD  PHYSICIAN ASSISTANT: Morley Kos, OPA-C  ANESTHESIA:  Local / MAC  SPECIMENS:  None  DRAINS:   None  EBL:  less than 50 mL  PREOPERATIVE INDICATIONS:  [name] is a  37 y.o. female with a diagnosis of RIGHT THUMB TRIGGER DIGIT who failed conservative measures and elected for surgical management.    The risks benefits and alternatives were discussed with the patient preoperatively including but not limited to the risks of infection, bleeding, nerve injury, cardiopulmonary complications, the need for revision surgery, among others, and the patient verbalized understanding and consented to proceed.  OPERATIVE IMPLANTS: None  OPERATIVE FINDINGS: See Below  OPERATIVE PROCEDURE:  After receiving prophylactic antibiotics, the patient was escorted to the operative theatre and placed in a supine position.   A surgical "time-out" was performed during which the planned procedure, proposed operative site, and the correct patient identity were compared to the operative consent and agreement confirmed by the circulating nurse according to current facility policy.  Digital block was performed with a mixture of lidocaine and Marcaine, bearing epinephrine. Following application of a tourniquet to the operative extremity, the exposed skin was prepped with Chloraprep and draped in the usual sterile fashion.  The limb was exsanguinated with an Esmarch bandage and the tourniquet inflated to approximately 126mHg higher than systolic BP.  An oblique incision was made over the A1 pulley of the affected digit.  Subcutaneous taste tissues were dissected with blunt and spreading dissection to reveal an underlying flexor tendon sheath and A1  pulley.  With the neurovascular structures protected, the A1 pulley was split in the midline under direct visualization with loupe assistance.  Some crossing bands proximal to the A1 pulley were also released.  The tendon was pulled into view, cleaned of thickened synovium and return to its bed.  The wound was irrigated, tourniquet released, and skin closed with 4-0 Vicryl Rapide.  A light dressing was applied and the patient was taken to the recovery room.  DISPOSITION: The patient will be discharged home today with typical instructions, returning in 10-15 days.

## 2018-04-30 NOTE — Anesthesia Postprocedure Evaluation (Signed)
Anesthesia Post Note  Patient: [name]  Procedure(s) Performed: RIGHT THUMB TRIGGER RELEASE (Right Hand)     Patient location during evaluation: PACU Anesthesia Type: MAC Level of consciousness: awake and alert Pain management: pain level controlled Vital Signs Assessment: post-procedure vital signs reviewed and stable Respiratory status: spontaneous breathing, nonlabored ventilation and respiratory function stable Cardiovascular status: stable and blood pressure returned to baseline Postop Assessment: no apparent nausea or vomiting Anesthetic complications: no    Last Vitals:  Vitals:   04/30/18 1030 04/30/18 1100  BP: 113/80 130/90  Pulse: 65 66  Resp: (!) 24 20  Temp:  37.2 C  SpO2: 99% 100%    Last Pain:  Vitals:   04/30/18 1100  TempSrc:   PainSc: 0-No pain                 Lynda Rainwater

## 2018-05-01 ENCOUNTER — Encounter (HOSPITAL_BASED_OUTPATIENT_CLINIC_OR_DEPARTMENT_OTHER): Payer: Self-pay | Admitting: Orthopedic Surgery

## 2018-05-03 MED FILL — TIVICAY 50 MG TABLET: 50 | 30 days supply | Qty: 30 | Fill #9

## 2018-05-03 MED FILL — DESCOVY 200-25 MG TABS: 200-25 | 30 days supply | Qty: 30 | Fill #9

## 2018-05-30 DIAGNOSIS — Z8639 Personal history of other endocrine, nutritional and metabolic disease: Secondary | ICD-10-CM

## 2018-05-30 DIAGNOSIS — Z87898 Personal history of other specified conditions: Secondary | ICD-10-CM

## 2018-05-30 HISTORY — DX: Personal history of other endocrine, nutritional and metabolic disease: Z86.39

## 2018-05-30 HISTORY — DX: Personal history of other specified conditions: Z87.898

## 2018-06-01 MED FILL — DESCOVY 200-25 MG TABS: 200-25 | 30 days supply | Qty: 30 | Fill #10

## 2018-06-01 MED FILL — TIVICAY 50 MG TABLET: 50 | 30 days supply | Qty: 30 | Fill #10

## 2018-06-07 DIAGNOSIS — R339 Retention of urine, unspecified: Secondary | ICD-10-CM | POA: Insufficient documentation

## 2018-06-11 ENCOUNTER — Telehealth: Payer: Self-pay | Admitting: *Deleted

## 2018-06-11 NOTE — Telephone Encounter (Signed)
inititiated PA for aimovig 140mg  / ml  KEY [id number]. [employer] ID [id number] RX Bin [id number], PCN [id number], GRP [id number]. Has been on aimovig since 02-06-2018 tolerated well, and had decrease in # migraines and intensity.

## 2018-06-12 ENCOUNTER — Ambulatory Visit (INDEPENDENT_AMBULATORY_CARE_PROVIDER_SITE_OTHER): Payer: Medicare Other | Admitting: Neurology

## 2018-06-12 ENCOUNTER — Encounter

## 2018-06-12 ENCOUNTER — Encounter: Payer: Self-pay | Admitting: Neurology

## 2018-06-12 VITALS — BP 127/75 | HR 86 | Ht 66.0 in | Wt 108.0 lb

## 2018-06-12 DIAGNOSIS — G500 Trigeminal neuralgia: Secondary | ICD-10-CM

## 2018-06-12 DIAGNOSIS — R634 Abnormal weight loss: Secondary | ICD-10-CM

## 2018-06-12 DIAGNOSIS — G40009 Localization-related (focal) (partial) idiopathic epilepsy and epileptic syndromes with seizures of localized onset, not intractable, without status epilepticus: Secondary | ICD-10-CM | POA: Diagnosis not present

## 2018-06-12 DIAGNOSIS — K5903 Drug induced constipation: Secondary | ICD-10-CM | POA: Insufficient documentation

## 2018-06-12 DIAGNOSIS — G43709 Chronic migraine without aura, not intractable, without status migrainosus: Secondary | ICD-10-CM | POA: Diagnosis not present

## 2018-06-12 DIAGNOSIS — R112 Nausea with vomiting, unspecified: Secondary | ICD-10-CM

## 2018-06-12 MED ORDER — NALOXEGOL OXALATE 25 MG PO TABS: 25.0000 mg | ORAL_TABLET | Freq: Every day | ORAL | 3 refills | Status: DC

## 2018-06-12 NOTE — Telephone Encounter (Signed)
Received PA approval for aimovig, from [facility] [employer].  ID March 23, 1981.  Approved 06/11/18 until further notice. I called pt and let her know.  Fax confirmation received to [facility] [phone].
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
## 2018-06-12 NOTE — Patient Instructions (Signed)
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  Has a hard time pooping.  Has poop that is dry, hard, or bigger than normal. Follow these instructions at home: Eating and drinking   Eat foods that have a lot of fiber, such as: ? Fresh fruits and vegetables. ? Whole grains. ? Beans.  Eat less of foods that are high in fat, low in fiber, or overly processed, such as: ? [place] fries. ? Hamburgers. ? Cookies. ? Candy. ? Soda.  Drink enough fluid to keep your pee (urine) clear or pale yellow. General instructions  Exercise regularly or as told by your doctor.  Go to the restroom when you feel like you need to poop. Do not hold it in.  Take over-the-counter and prescription medicines only as told by your doctor. These include any fiber supplements.  Do pelvic floor retraining exercises, such as: ? Doing deep breathing while relaxing your lower belly (abdomen). ? Relaxing your pelvic floor while pooping.  Watch your condition for any changes.  Keep all follow-up visits as told by your doctor. This is important. Contact a doctor if:  You have pain that gets worse.  You have a fever.  You have not pooped for 4 days.  You throw up (vomit).  You are not hungry.  You lose weight.  You are bleeding from the anus.  You have thin, pencil-like poop (stool). Get help right away if:  You have a fever, and your symptoms suddenly get worse.  You leak poop or have blood in your poop.  Your belly feels hard or bigger than normal (is bloated).  You have very bad belly pain.  You feel dizzy or you faint. This information is not intended to replace advice given to you by your health care provider. Make sure you discuss any questions you have with your health care  provider. Document Released: 11/02/2007 Document Revised: 12/04/2015 Document Reviewed: 11/04/2015 [employer] Interactive Patient Education  2019 [employer].
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
## 2018-06-22 ENCOUNTER — Inpatient Hospital Stay (HOSPITAL_COMMUNITY)
Admission: EM | Admit: 2018-06-22 | Discharge: 2018-06-27 | DRG: 917 | Disposition: A | Discharge status: Home / Self Care | Payer: Medicare Other | Attending: Family Medicine | Admitting: Family Medicine

## 2018-06-22 DIAGNOSIS — F39 Unspecified mood [affective] disorder: Secondary | ICD-10-CM

## 2018-06-22 DIAGNOSIS — F4000 Agoraphobia, unspecified: Secondary | ICD-10-CM | POA: Diagnosis present

## 2018-06-22 DIAGNOSIS — G40909 Epilepsy, unspecified, not intractable, without status epilepticus: Secondary | ICD-10-CM | POA: Diagnosis present

## 2018-06-22 DIAGNOSIS — F259 Schizoaffective disorder, unspecified: Secondary | ICD-10-CM | POA: Diagnosis present

## 2018-06-22 DIAGNOSIS — Z681 Body mass index (BMI) 19 or less, adult: Secondary | ICD-10-CM

## 2018-06-22 DIAGNOSIS — F1911 Other psychoactive substance abuse, in remission: Secondary | ICD-10-CM | POA: Diagnosis present

## 2018-06-22 DIAGNOSIS — J9601 Acute respiratory failure with hypoxia: Secondary | ICD-10-CM | POA: Diagnosis present

## 2018-06-22 DIAGNOSIS — F4310 Post-traumatic stress disorder, unspecified: Secondary | ICD-10-CM | POA: Diagnosis present

## 2018-06-22 DIAGNOSIS — T426X1A Poisoning by other antiepileptic and sedative-hypnotic drugs, accidental (unintentional), initial encounter: Principal | ICD-10-CM | POA: Diagnosis present

## 2018-06-22 DIAGNOSIS — E440 Moderate protein-calorie malnutrition: Secondary | ICD-10-CM

## 2018-06-22 DIAGNOSIS — B20 Human immunodeficiency virus [HIV] disease: Secondary | ICD-10-CM | POA: Diagnosis present

## 2018-06-22 DIAGNOSIS — J690 Pneumonitis due to inhalation of food and vomit: Secondary | ICD-10-CM | POA: Diagnosis not present

## 2018-06-22 DIAGNOSIS — Z888 Allergy status to other drugs, medicaments and biological substances status: Secondary | ICD-10-CM

## 2018-06-22 DIAGNOSIS — G629 Polyneuropathy, unspecified: Secondary | ICD-10-CM | POA: Diagnosis present

## 2018-06-22 DIAGNOSIS — G500 Trigeminal neuralgia: Secondary | ICD-10-CM | POA: Diagnosis present

## 2018-06-22 DIAGNOSIS — E0590 Thyrotoxicosis, unspecified without thyrotoxic crisis or storm: Secondary | ICD-10-CM | POA: Diagnosis present

## 2018-06-22 DIAGNOSIS — E872 Acidosis: Secondary | ICD-10-CM | POA: Diagnosis present

## 2018-06-22 DIAGNOSIS — E876 Hypokalemia: Secondary | ICD-10-CM | POA: Diagnosis present

## 2018-06-22 DIAGNOSIS — G9340 Encephalopathy, unspecified: Secondary | ICD-10-CM | POA: Diagnosis present

## 2018-06-22 DIAGNOSIS — R4182 Altered mental status, unspecified: Secondary | ICD-10-CM | POA: Diagnosis not present

## 2018-06-22 DIAGNOSIS — T404X1A Poisoning by other synthetic narcotics, accidental (unintentional), initial encounter: Secondary | ICD-10-CM | POA: Diagnosis present

## 2018-06-22 DIAGNOSIS — Z791 Long term (current) use of non-steroidal anti-inflammatories (NSAID): Secondary | ICD-10-CM

## 2018-06-22 DIAGNOSIS — J9690 Respiratory failure, unspecified, unspecified whether with hypoxia or hypercapnia: Secondary | ICD-10-CM

## 2018-06-22 DIAGNOSIS — Z781 Physical restraint status: Secondary | ICD-10-CM

## 2018-06-22 DIAGNOSIS — G92 Toxic encephalopathy: Secondary | ICD-10-CM | POA: Diagnosis present

## 2018-06-22 DIAGNOSIS — F17210 Nicotine dependence, cigarettes, uncomplicated: Secondary | ICD-10-CM | POA: Diagnosis present

## 2018-06-22 DIAGNOSIS — R339 Retention of urine, unspecified: Secondary | ICD-10-CM | POA: Diagnosis not present

## 2018-06-22 DIAGNOSIS — Z79899 Other long term (current) drug therapy: Secondary | ICD-10-CM

## 2018-06-22 DIAGNOSIS — F419 Anxiety disorder, unspecified: Secondary | ICD-10-CM | POA: Diagnosis present

## 2018-06-22 DIAGNOSIS — F329 Major depressive disorder, single episode, unspecified: Secondary | ICD-10-CM | POA: Diagnosis present

## 2018-06-22 DIAGNOSIS — Z90710 Acquired absence of both cervix and uterus: Secondary | ICD-10-CM

## 2018-06-22 MED ORDER — LACTATED RINGERS IV BOLUS (SEPSIS)
500.0000 mL | Freq: Once | INTRAVENOUS | Status: AC
  Administered 2018-06-23: 500 mL via INTRAVENOUS

## 2018-06-22 MED ORDER — MIDAZOLAM HCL 2 MG/2ML IJ SOLN
INTRAMUSCULAR | Status: AC
  Administered 2018-06-23: 2 mg via INTRAVENOUS
  Filled 2018-06-22: qty 10

## 2018-06-22 MED ORDER — LACTATED RINGERS IV BOLUS (SEPSIS)
1000.0000 mL | Freq: Once | INTRAVENOUS | Status: AC
  Administered 2018-06-23: 1000 mL via INTRAVENOUS

## 2018-06-23 ENCOUNTER — Emergency Department (HOSPITAL_COMMUNITY): Payer: Medicare Other

## 2018-06-23 ENCOUNTER — Inpatient Hospital Stay (HOSPITAL_COMMUNITY): Payer: Medicare Other

## 2018-06-23 ENCOUNTER — Encounter (HOSPITAL_COMMUNITY): Payer: Self-pay | Admitting: Oncology

## 2018-06-23 DIAGNOSIS — R339 Retention of urine, unspecified: Secondary | ICD-10-CM | POA: Diagnosis not present

## 2018-06-23 DIAGNOSIS — F1911 Other psychoactive substance abuse, in remission: Secondary | ICD-10-CM | POA: Diagnosis present

## 2018-06-23 DIAGNOSIS — E872 Acidosis: Secondary | ICD-10-CM | POA: Diagnosis present

## 2018-06-23 DIAGNOSIS — J690 Pneumonitis due to inhalation of food and vomit: Secondary | ICD-10-CM | POA: Diagnosis not present

## 2018-06-23 DIAGNOSIS — T50904D Poisoning by unspecified drugs, medicaments and biological substances, undetermined, subsequent encounter: Secondary | ICD-10-CM | POA: Diagnosis not present

## 2018-06-23 DIAGNOSIS — G92 Toxic encephalopathy: Secondary | ICD-10-CM | POA: Diagnosis present

## 2018-06-23 DIAGNOSIS — F17210 Nicotine dependence, cigarettes, uncomplicated: Secondary | ICD-10-CM | POA: Diagnosis present

## 2018-06-23 DIAGNOSIS — F329 Major depressive disorder, single episode, unspecified: Secondary | ICD-10-CM | POA: Diagnosis present

## 2018-06-23 DIAGNOSIS — Z781 Physical restraint status: Secondary | ICD-10-CM | POA: Diagnosis not present

## 2018-06-23 DIAGNOSIS — F4310 Post-traumatic stress disorder, unspecified: Secondary | ICD-10-CM | POA: Diagnosis present

## 2018-06-23 DIAGNOSIS — F39 Unspecified mood [affective] disorder: Secondary | ICD-10-CM | POA: Diagnosis not present

## 2018-06-23 DIAGNOSIS — E876 Hypokalemia: Secondary | ICD-10-CM | POA: Diagnosis present

## 2018-06-23 DIAGNOSIS — G9340 Encephalopathy, unspecified: Secondary | ICD-10-CM | POA: Diagnosis present

## 2018-06-23 DIAGNOSIS — F419 Anxiety disorder, unspecified: Secondary | ICD-10-CM | POA: Diagnosis present

## 2018-06-23 DIAGNOSIS — B20 Human immunodeficiency virus [HIV] disease: Secondary | ICD-10-CM | POA: Diagnosis present

## 2018-06-23 DIAGNOSIS — Z681 Body mass index (BMI) 19 or less, adult: Secondary | ICD-10-CM | POA: Diagnosis not present

## 2018-06-23 DIAGNOSIS — G500 Trigeminal neuralgia: Secondary | ICD-10-CM | POA: Diagnosis present

## 2018-06-23 DIAGNOSIS — R4182 Altered mental status, unspecified: Secondary | ICD-10-CM | POA: Diagnosis present

## 2018-06-23 DIAGNOSIS — F4000 Agoraphobia, unspecified: Secondary | ICD-10-CM | POA: Diagnosis present

## 2018-06-23 DIAGNOSIS — J9690 Respiratory failure, unspecified, unspecified whether with hypoxia or hypercapnia: Secondary | ICD-10-CM | POA: Diagnosis not present

## 2018-06-23 DIAGNOSIS — G40909 Epilepsy, unspecified, not intractable, without status epilepticus: Secondary | ICD-10-CM | POA: Diagnosis present

## 2018-06-23 DIAGNOSIS — F259 Schizoaffective disorder, unspecified: Secondary | ICD-10-CM | POA: Diagnosis present

## 2018-06-23 DIAGNOSIS — E0590 Thyrotoxicosis, unspecified without thyrotoxic crisis or storm: Secondary | ICD-10-CM | POA: Diagnosis present

## 2018-06-23 DIAGNOSIS — G629 Polyneuropathy, unspecified: Secondary | ICD-10-CM | POA: Diagnosis present

## 2018-06-23 DIAGNOSIS — T404X1A Poisoning by other synthetic narcotics, accidental (unintentional), initial encounter: Secondary | ICD-10-CM | POA: Diagnosis present

## 2018-06-23 DIAGNOSIS — J9601 Acute respiratory failure with hypoxia: Secondary | ICD-10-CM | POA: Diagnosis not present

## 2018-06-23 DIAGNOSIS — T426X1A Poisoning by other antiepileptic and sedative-hypnotic drugs, accidental (unintentional), initial encounter: Secondary | ICD-10-CM | POA: Diagnosis present

## 2018-06-23 DIAGNOSIS — E440 Moderate protein-calorie malnutrition: Secondary | ICD-10-CM | POA: Diagnosis present

## 2018-06-23 LAB — COMPREHENSIVE METABOLIC PANEL
ALK PHOS: 54 U/L (ref 38–126)
ALT: 24 U/L (ref 0–44)
AST: 35 U/L (ref 15–41)
Albumin: 4.4 g/dL (ref 3.5–5.0)
Anion gap: 17 — ABNORMAL HIGH (ref 5–15)
BUN: 15 mg/dL (ref 6–20)
CO2: 19 mmol/L — ABNORMAL LOW (ref 22–32)
CREATININE: 0.92 mg/dL (ref 0.44–1.00)
Calcium: 9 mg/dL (ref 8.9–10.3)
Chloride: 102 mmol/L (ref 98–111)
GFR calc non Af Amer: >60 mL/min (ref >60)
Glucose, Bld: 121 mg/dL — ABNORMAL HIGH (ref 70–99)
Potassium: 4 mmol/L (ref 3.5–5.1)
Sodium: 138 mmol/L (ref 135–145)
Total Bilirubin: 0.7 mg/dL (ref 0.3–1.2)
Total Protein: 7.3 g/dL (ref 6.5–8.1)

## 2018-06-23 LAB — BASIC METABOLIC PANEL
Anion gap: 9 (ref 5–15)
BUN: 9 mg/dL (ref 6–20)
CO2: 20 mmol/L — ABNORMAL LOW (ref 22–32)
Calcium: 7.7 mg/dL — ABNORMAL LOW (ref 8.9–10.3)
Chloride: 108 mmol/L (ref 98–111)
Creatinine, Ser: 0.76 mg/dL (ref 0.44–1.00)
GFR calc Af Amer: >60 mL/min (ref >60)
GLUCOSE: 121 mg/dL — AB (ref 70–99)
Potassium: 4.1 mmol/L (ref 3.5–5.1)
Sodium: 137 mmol/L (ref 135–145)

## 2018-06-23 LAB — CBC WITH DIFFERENTIAL/PLATELET
Abs Immature Granulocytes: 0.23 10*3/uL — ABNORMAL HIGH (ref 0.00–0.07)
Basophils Absolute: 0.1 10*3/uL (ref 0.0–0.1)
Basophils Relative: 0 %
EOS PCT: 0 %
Eosinophils Absolute: 0 10*3/uL (ref 0.0–0.5)
HEMATOCRIT: 42.4 % (ref 36.0–46.0)
Hemoglobin: 13.5 g/dL (ref 12.0–15.0)
Immature Granulocytes: 1 %
Lymphocytes Relative: 6 %
Lymphs Abs: 1.5 10*3/uL (ref 0.7–4.0)
MCH: 31.5 pg (ref 26.0–34.0)
MCHC: 31.8 g/dL (ref 30.0–36.0)
MCV: 98.8 fL (ref 80.0–100.0)
Monocytes Absolute: 1.3 10*3/uL — ABNORMAL HIGH (ref 0.1–1.0)
Monocytes Relative: 6 %
Neutro Abs: 20.2 10*3/uL — ABNORMAL HIGH (ref 1.7–7.7)
Neutrophils Relative %: 87 %
Platelets: 230 10*3/uL (ref 150–400)
RBC: 4.29 MIL/uL (ref 3.87–5.11)
RDW: 12.6 % (ref 11.5–15.5)
WBC: 23.3 10*3/uL — ABNORMAL HIGH (ref 4.0–10.5)
nRBC: 0 % (ref 0.0–0.2)

## 2018-06-23 LAB — GLUCOSE, CAPILLARY
GLUCOSE-CAPILLARY: 136 mg/dL — AB (ref 70–99)
Glucose-Capillary: 64 mg/dL — ABNORMAL LOW (ref 70–99)
Glucose-Capillary: 70 mg/dL (ref 70–99)
Glucose-Capillary: 133 mg/dL — ABNORMAL HIGH (ref 70–99)
Glucose-Capillary: 69 mg/dL — ABNORMAL LOW (ref 70–99)
Glucose-Capillary: 60 mg/dL — ABNORMAL LOW (ref 70–99)
Glucose-Capillary: 60 mg/dL — ABNORMAL LOW (ref 70–99)
Glucose-Capillary: 142 mg/dL — ABNORMAL HIGH (ref 70–99)
Glucose-Capillary: 75 mg/dL (ref 70–99)

## 2018-06-23 LAB — LACTIC ACID, PLASMA
Lactic Acid, Venous: 2.5 mmol/L (ref 0.5–1.9)
Lactic Acid, Venous: 2.5 mmol/L (ref 0.5–1.9)
Lactic Acid, Venous: 2.6 mmol/L (ref 0.5–1.9)

## 2018-06-23 LAB — POCT I-STAT 7, (LYTES, BLD GAS, ICA,H+H)
Acid-base deficit: 4 mmol/L — ABNORMAL HIGH (ref 0.0–2.0)
Bicarbonate: 19.9 mmol/L — ABNORMAL LOW (ref 20.0–28.0)
Calcium, Ion: 1.12 mmol/L — ABNORMAL LOW (ref 1.15–1.40)
HCT: 34 % — ABNORMAL LOW (ref 36.0–46.0)
Hemoglobin: 11.6 g/dL — ABNORMAL LOW (ref 12.0–15.0)
O2 Saturation: 90 %
Patient temperature: 100.4
Potassium: 3.7 mmol/L (ref 3.5–5.1)
Sodium: 138 mmol/L (ref 135–145)
TCO2: 21 mmol/L — ABNORMAL LOW (ref 22–32)
pCO2 arterial: 35 mmHg (ref 32.0–48.0)
pH, Arterial: 7.367 (ref 7.350–7.450)
pO2, Arterial: 64 mmHg — ABNORMAL LOW (ref 83.0–108.0)

## 2018-06-23 LAB — URINALYSIS, ROUTINE W REFLEX MICROSCOPIC
BILIRUBIN URINE: NEGATIVE
Bacteria, UA: NONE SEEN
Glucose, UA: 50 mg/dL — AB
Hgb urine dipstick: NEGATIVE
Ketones, ur: 5 mg/dL — AB
Leukocytes, UA: NEGATIVE
Nitrite: NEGATIVE
Protein, ur: 30 mg/dL — AB
Specific Gravity, Urine: 1.018 (ref 1.005–1.030)
pH: 7 (ref 5.0–8.0)

## 2018-06-23 LAB — TSH: TSH: 1.15 u[IU]/mL (ref 0.350–4.500)

## 2018-06-23 LAB — SALICYLATE LEVEL: Salicylate Lvl: <7 mg/dL (ref 2.8–30.0)

## 2018-06-23 LAB — MAGNESIUM
Magnesium: 1.9 mg/dL (ref 1.7–2.4)
Magnesium: 2.1 mg/dL (ref 1.7–2.4)
Magnesium: 2 mg/dL (ref 1.7–2.4)

## 2018-06-23 LAB — CBC
HCT: 37.4 % (ref 36.0–46.0)
Hemoglobin: 11.6 g/dL — ABNORMAL LOW (ref 12.0–15.0)
MCH: 31.3 pg (ref 26.0–34.0)
MCHC: 31 g/dL (ref 30.0–36.0)
MCV: 100.8 fL — ABNORMAL HIGH (ref 80.0–100.0)
PLATELETS: 154 10*3/uL (ref 150–400)
RBC: 3.71 MIL/uL — AB (ref 3.87–5.11)
RDW: 12.6 % (ref 11.5–15.5)
WBC: 15.5 10*3/uL — ABNORMAL HIGH (ref 4.0–10.5)
nRBC: 0 % (ref 0.0–0.2)

## 2018-06-23 LAB — PROTIME-INR
INR: 1.12
PROTHROMBIN TIME: 14.3 s (ref 11.4–15.2)

## 2018-06-23 LAB — RAPID URINE DRUG SCREEN, HOSP PERFORMED
Amphetamines: NOT DETECTED
Barbiturates: NOT DETECTED
Benzodiazepines: POSITIVE — AB
COCAINE: NOT DETECTED
Opiates: NOT DETECTED
Tetrahydrocannabinol: POSITIVE — AB

## 2018-06-23 LAB — TRIGLYCERIDES: Triglycerides: 74 mg/dL (ref <150)

## 2018-06-23 LAB — MRSA PCR SCREENING: MRSA BY PCR: NEGATIVE

## 2018-06-23 LAB — APTT: aPTT: 29 seconds (ref 24–36)

## 2018-06-23 LAB — PHOSPHORUS
Phosphorus: 2.8 mg/dL (ref 2.5–4.6)
Phosphorus: 2 mg/dL — ABNORMAL LOW (ref 2.5–4.6)
Phosphorus: 2.3 mg/dL — ABNORMAL LOW (ref 2.5–4.6)

## 2018-06-23 LAB — INFLUENZA PANEL BY PCR (TYPE A & B)
Influenza A By PCR: NEGATIVE
Influenza B By PCR: NEGATIVE

## 2018-06-23 LAB — PREGNANCY, URINE: Preg Test, Ur: NEGATIVE

## 2018-06-23 MED ORDER — FAMOTIDINE 40 MG/5ML PO SUSR
20.0000 mg | Freq: Two times a day (BID) | ORAL | Status: DC
  Administered 2018-06-23 – 2018-06-25 (×5): 20 mg
  Filled 2018-06-23 (×4): qty 2.5

## 2018-06-23 MED ORDER — MIDAZOLAM HCL 2 MG/2ML IJ SOLN
2.0000 mg | INTRAMUSCULAR | Status: DC | PRN
  Administered 2018-06-23 – 2018-06-24 (×4): 2 mg via INTRAVENOUS
  Administered 2018-06-24: 4 mg via INTRAVENOUS
  Administered 2018-06-24 (×3): 2 mg via INTRAVENOUS
  Administered 2018-06-24 (×2): 4 mg via INTRAVENOUS
  Administered 2018-06-24: 2 mg via INTRAVENOUS
  Administered 2018-06-25: 4 mg via INTRAVENOUS
  Administered 2018-06-25 (×4): 2 mg via INTRAVENOUS
  Filled 2018-06-23 (×9): qty 2
  Filled 2018-06-23: qty 4
  Filled 2018-06-23: qty 2
  Filled 2018-06-23 (×3): qty 4
  Filled 2018-06-23 (×4): qty 2
  Filled 2018-06-23: qty 4

## 2018-06-23 MED ORDER — DEXTROSE 5 % IV SOLN
500.0000 mg | Freq: Once | INTRAVENOUS | Status: AC
  Administered 2018-06-23: 500 mg via INTRAVENOUS
  Filled 2018-06-23: qty 10

## 2018-06-23 MED ORDER — ROCURONIUM BROMIDE 50 MG/5ML IV SOLN
50.0000 mg | Freq: Once | INTRAVENOUS | Status: AC
  Administered 2018-06-23: 50 mg via INTRAVENOUS
  Filled 2018-06-23: qty 5

## 2018-06-23 MED ORDER — DEXTROSE 50 % IV SOLN: 25.0000 mL | Freq: Once | INTRAVENOUS | Status: AC

## 2018-06-23 MED ORDER — PROPOFOL 1000 MG/100ML IV EMUL
INTRAVENOUS | Status: AC
  Filled 2018-06-23: qty 100

## 2018-06-23 MED ORDER — FENTANYL CITRATE (PF) 100 MCG/2ML IJ SOLN
100.0000 ug | INTRAMUSCULAR | Status: DC | PRN
  Administered 2018-06-23 – 2018-06-24 (×5): 100 ug via INTRAVENOUS

## 2018-06-23 MED ORDER — GADOBUTROL 1 MMOL/ML IV SOLN
4.5000 mL | Freq: Once | INTRAVENOUS | Status: AC | PRN
  Administered 2018-06-23: 4.5 mL via INTRAVENOUS

## 2018-06-23 MED ORDER — DEXTROSE 50 % IV SOLN
INTRAVENOUS | Status: AC
  Filled 2018-06-23: qty 50

## 2018-06-23 MED ORDER — GABAPENTIN 250 MG/5ML PO SOLN
300.0000 mg | Freq: Three times a day (TID) | ORAL | Status: DC
  Administered 2018-06-23 – 2018-06-26 (×9): 300 mg
  Filled 2018-06-23 (×12): qty 6

## 2018-06-23 MED ORDER — DEXTROSE 50 % IV SOLN
25.0000 mL | Freq: Once | INTRAVENOUS | Status: AC
  Administered 2018-06-23: 25 mL via INTRAVENOUS

## 2018-06-23 MED ORDER — FENTANYL BOLUS VIA INFUSION
50.0000 ug | INTRAVENOUS | Status: DC | PRN
  Administered 2018-06-25: 50 ug via INTRAVENOUS
  Filled 2018-06-23: qty 50

## 2018-06-23 MED ORDER — BACLOFEN 10 MG PO TABS
10.0000 mg | ORAL_TABLET | Freq: Three times a day (TID) | ORAL | Status: DC
  Administered 2018-06-23 – 2018-06-27 (×13): 10 mg
  Filled 2018-06-23 (×13): qty 1

## 2018-06-23 MED ORDER — DEXTROSE-NACL 5-0.9 % IV SOLN
INTRAVENOUS | Status: DC
  Administered 2018-06-23 – 2018-06-25 (×2): via INTRAVENOUS

## 2018-06-23 MED ORDER — VANCOMYCIN HCL 500 MG IV SOLR
500.0000 mg | Freq: Three times a day (TID) | INTRAVENOUS | Status: DC
  Administered 2018-06-23: 500 mg via INTRAVENOUS
  Filled 2018-06-23 (×2): qty 500

## 2018-06-23 MED ORDER — EMTRICITABINE-TENOFOVIR AF 200-25 MG PO TABS
1.0000 | ORAL_TABLET | Freq: Every day | ORAL | Status: DC
  Administered 2018-06-23 – 2018-06-27 (×5): 1
  Filled 2018-06-23 (×6): qty 1

## 2018-06-23 MED ORDER — PRO-STAT SUGAR FREE PO LIQD
30.0000 mL | Freq: Every day | ORAL | Status: DC
  Administered 2018-06-24: 30 mL
  Filled 2018-06-23: qty 30

## 2018-06-23 MED ORDER — LORAZEPAM 2 MG/ML IJ SOLN
2.0000 mg | Freq: Once | INTRAMUSCULAR | Status: AC
  Administered 2018-06-23: 2 mg via INTRAVENOUS
  Filled 2018-06-23: qty 1

## 2018-06-23 MED ORDER — DEXTROSE 5 % IV SOLN
10.0000 mg/kg | Freq: Three times a day (TID) | INTRAVENOUS | Status: DC
  Filled 2018-06-23 (×2): qty 9.6

## 2018-06-23 MED ORDER — FENTANYL 2500MCG IN NS 250ML (10MCG/ML) PREMIX INFUSION
25.0000 ug/h | INTRAVENOUS | Status: DC
  Administered 2018-06-23: 200 ug/h via INTRAVENOUS
  Administered 2018-06-23: 75 ug/h via INTRAVENOUS
  Administered 2018-06-24: 350 ug/h via INTRAVENOUS
  Administered 2018-06-24: 200 ug/h via INTRAVENOUS
  Administered 2018-06-24 – 2018-06-25 (×2): 300 ug/h via INTRAVENOUS
  Administered 2018-06-25: 350 ug/h via INTRAVENOUS
  Filled 2018-06-23 (×7): qty 250

## 2018-06-23 MED ORDER — PROPOFOL 1000 MG/100ML IV EMUL
0.0000 ug/kg/min | INTRAVENOUS | Status: DC
  Administered 2018-06-23: 20 ug/kg/min via INTRAVENOUS
  Administered 2018-06-23: 5 ug/kg/min via INTRAVENOUS
  Administered 2018-06-24: 30 ug/kg/min via INTRAVENOUS
  Administered 2018-06-24: 40 ug/kg/min via INTRAVENOUS
  Administered 2018-06-24: 50 ug/kg/min via INTRAVENOUS
  Administered 2018-06-25 (×2): 40 ug/kg/min via INTRAVENOUS
  Filled 2018-06-23: qty 200
  Filled 2018-06-23 (×7): qty 100

## 2018-06-23 MED ORDER — VITAL HIGH PROTEIN PO LIQD
1000.0000 mL | ORAL | Status: DC
  Administered 2018-06-23: 1000 mL

## 2018-06-23 MED ORDER — SODIUM CHLORIDE 0.9 % IV SOLN
INTRAVENOUS | Status: DC | PRN
  Administered 2018-06-23: 250 mL via INTRAVENOUS

## 2018-06-23 MED ORDER — MIDAZOLAM HCL 2 MG/2ML IJ SOLN: 2.0000 mg | INTRAMUSCULAR | Status: DC | PRN

## 2018-06-23 MED ORDER — ETOMIDATE 2 MG/ML IV SOLN
15.0000 mg | Freq: Once | INTRAVENOUS | Status: AC
  Administered 2018-06-23: 15 mg via INTRAVENOUS

## 2018-06-23 MED ORDER — LAMOTRIGINE 100 MG PO TABS
200.0000 mg | ORAL_TABLET | Freq: Two times a day (BID) | ORAL | Status: DC
  Administered 2018-06-23 – 2018-06-27 (×8): 200 mg
  Filled 2018-06-23 (×8): qty 2

## 2018-06-23 MED ORDER — DEXTROSE 50 % IV SOLN
INTRAVENOUS | Status: AC
  Administered 2018-06-23: 50 mL
  Filled 2018-06-23: qty 50

## 2018-06-23 MED ORDER — DOLUTEGRAVIR SODIUM 50 MG PO TABS
50.0000 mg | ORAL_TABLET | Freq: Every day | ORAL | Status: DC
  Administered 2018-06-23 – 2018-06-27 (×5): 50 mg via ORAL
  Filled 2018-06-23 (×6): qty 1

## 2018-06-23 MED ORDER — NOREPINEPHRINE-SODIUM CHLORIDE 4-0.9 MG/250ML-% IV SOLN
0.0000 ug/min | INTRAVENOUS | Status: DC
  Filled 2018-06-23: qty 250

## 2018-06-23 MED ORDER — SODIUM CHLORIDE 0.9 % IV SOLN
1.0000 g | INTRAVENOUS | Status: DC
  Administered 2018-06-24 – 2018-06-25 (×2): 1 g via INTRAVENOUS
  Filled 2018-06-23 (×2): qty 10

## 2018-06-23 MED ORDER — PRO-STAT SUGAR FREE PO LIQD
30.0000 mL | Freq: Two times a day (BID) | ORAL | Status: DC
  Administered 2018-06-23: 30 mL
  Filled 2018-06-23: qty 30

## 2018-06-23 MED ORDER — FENTANYL CITRATE (PF) 100 MCG/2ML IJ SOLN
100.0000 ug | INTRAMUSCULAR | Status: DC | PRN
  Administered 2018-06-23: 100 ug via INTRAVENOUS
  Filled 2018-06-23: qty 2

## 2018-06-23 MED ORDER — ORAL CARE MOUTH RINSE
15.0000 mL | OROMUCOSAL | Status: DC
  Administered 2018-06-23 – 2018-06-25 (×19): 15 mL via OROMUCOSAL

## 2018-06-23 MED ORDER — ENOXAPARIN SODIUM 40 MG/0.4ML ~~LOC~~ SOLN
40.0000 mg | SUBCUTANEOUS | Status: DC
  Administered 2018-06-23 – 2018-06-27 (×5): 40 mg via SUBCUTANEOUS
  Filled 2018-06-23 (×6): qty 0.4

## 2018-06-23 MED ORDER — BISACODYL 10 MG RE SUPP: 10.0000 mg | Freq: Every day | RECTAL | Status: DC | PRN

## 2018-06-23 MED ORDER — MIDAZOLAM HCL 2 MG/2ML IJ SOLN
2.0000 mg | INTRAMUSCULAR | Status: DC | PRN
  Administered 2018-06-23: 2 mg via INTRAVENOUS
  Filled 2018-06-23: qty 2

## 2018-06-23 MED ORDER — VANCOMYCIN HCL IN DEXTROSE 1-5 GM/200ML-% IV SOLN
1000.0000 mg | Freq: Once | INTRAVENOUS | Status: AC
  Administered 2018-06-23: 1000 mg via INTRAVENOUS
  Filled 2018-06-23: qty 200

## 2018-06-23 MED ORDER — DOCUSATE SODIUM 50 MG/5ML PO LIQD
100.0000 mg | Freq: Two times a day (BID) | ORAL | Status: DC | PRN
  Filled 2018-06-23: qty 10

## 2018-06-23 MED ORDER — FENTANYL CITRATE (PF) 100 MCG/2ML IJ SOLN
50.0000 ug | Freq: Once | INTRAMUSCULAR | Status: AC
  Administered 2018-06-23: 50 ug via INTRAVENOUS

## 2018-06-23 MED ORDER — SODIUM CHLORIDE 0.9 % IV SOLN
2.0000 g | Freq: Two times a day (BID) | INTRAVENOUS | Status: DC
  Administered 2018-06-23: 2 g via INTRAVENOUS
  Filled 2018-06-23 (×2): qty 20

## 2018-06-23 MED ORDER — VITAL AF 1.2 CAL PO LIQD
1000.0000 mL | ORAL | Status: DC
  Administered 2018-06-23 – 2018-06-24 (×2): 1000 mL

## 2018-06-23 MED ORDER — CHLORHEXIDINE GLUCONATE 0.12% ORAL RINSE (MEDLINE KIT)
15.0000 mL | Freq: Two times a day (BID) | OROMUCOSAL | Status: DC
  Administered 2018-06-23 – 2018-06-25 (×4): 15 mL via OROMUCOSAL

## 2018-06-23 NOTE — Progress Notes (Signed)
EEG completed, results pending. 

## 2018-06-23 NOTE — Progress Notes (Signed)
Initial Nutrition Assessment  DOCUMENTATION CODES:  Non-severe (moderate) malnutrition in context of social or environmental circumstances, Underweight  INTERVENTION:  Initiate TF via OGT with Vital AF 1.2 at goal rate of 40 ml/h (960 ml per day) and Prostat 30 ml Q24 hrs to provide 1252 kcals (+232 kcals), 87 gm protein, 779 ml free water daily.  NUTRITION DIAGNOSIS:  Inadequate oral intake related to inability to eat as evidenced by NPO status.  GOAL:  Patient will meet greater than or equal to 90% of their needs  MONITOR:  Diet advancement, Vent status, TF tolerance, I & O's, Labs  REASON FOR ASSESSMENT:  Consult Enteral/tube feeding initiation and management  ASSESSMENT:  38 y/o w/ pmhx HIV, polysubstance abuse, seizure w/ acute encephalopathy, hyperthyroidism. Found unresponsive at home w/ empty bottles around her. When EMS arrived pt became erratic, requiring haldol/versed. Extremely combative in ED, requiring physical/chemocal restraints. Intubated for airway protection.   Pt intubated, sedated. Unable to provide any information. No one else present in room.   Bed weight is 47 kg. Per review of wts in Care Everywhere, pts wt has been 100-110 for at least the past year.   Patient is currently intubated on ventilator support MV: 7.8 L/min Temp (24hrs), Avg:99.3 F (37.4 C), Min:98.7 F (37.1 C), Max:100.5 F (38.1 C) Propofol: 8.8 ml/hr = 232 kcals/d  Labs: Bg: 60-135, Lactic Acid: 2.6, WBC:15.5 Meds: HAART, H2RA, Vital high protein, Prostat, IV abx, IVF Sedation/Analgesia: Propofol, fentanyl Pressor Support: Levophed   Recent Labs  Lab 06/23/18 0012 06/23/18 0412 06/23/18 0507 06/23/18 1225 06/23/18 1646  NA 138 138 137  --   --   K 4.0 3.7 4.1  --   --   CL 102  --  108  --   --   CO2 19*  --  20*  --   --   BUN 15  --  9  --   --   CREATININE 0.92  --  0.76  --   --   CALCIUM 9.0  --  7.7*  --   --   MG  --   --  1.9 2.1 2.0  PHOS  --   --  2.8 2.0*  2.3*  GLUCOSE 121*  --  121*  --   --    NUTRITION - FOCUSED PHYSICAL EXAM:   Most Recent Value  Orbital Region  Mild depletion  Upper Arm Region  Moderate depletion  Thoracic and Lumbar Region  Mild depletion  Buccal Region  Mild depletion  Temple Region  Mild depletion  Clavicle Bone Region  Mild depletion  Clavicle and Acromion Bone Region  Mild depletion  Scapular Bone Region  Unable to assess  Dorsal Hand  Unable to assess  Patellar Region  No depletion  Anterior Thigh Region  Moderate depletion  Posterior Calf Region  Mild depletion  Edema (RD Assessment)  None  Hair  Reviewed  Eyes  Reviewed  Mouth  Reviewed  Skin  Reviewed  Nails  Reviewed     Diet Order:   Diet Order            Diet NPO time specified  Diet effective now             EDUCATION NEEDS:  No education needs have been identified at this time  Skin:  Skin Assessment: Reviewed RN Assessment  Last BM:  Unknown  Height:  Ht Readings from Last 1 Encounters:  06/23/18 5' 5.5" (1.664 m)   Weight:  Wt Readings from Last 1 Encounters:  06/23/18 47 kg    Wt Readings from Last 10 Encounters:  06/23/18 47 kg  06/12/18 49 kg  04/30/18 47.1 kg  02/20/18 47.6 kg  01/16/18 45.8 kg  10/28/17 47.6 kg  09/13/17 47.1 kg  07/24/17 47.2 kg  03/15/17 51.8 kg  01/17/17 51.3 kg   Ideal Body Weight:  58 kg  BMI:  Body mass index is 16.98 kg/m.  Estimated Nutritional Needs:  Kcal:  1510 (PSU 2003B) Protein:  71-85g Pro (1.5-1.8g/kg bw) Fluid:  Per MD goals  Christophe Louis RD, LDN, CNSC Clinical Nutrition Available Tues-Sat via Pager: [phone] 06/23/2018 6:59 PM
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
## 2018-06-23 NOTE — ED Notes (Signed)
Dr. Rhunette Croft is aware of pt's lactic acid of 2.5.

## 2018-06-23 NOTE — Progress Notes (Signed)
Patient has been combative since arrival. patient in on mechanical ventilation still remains extremely combative if not adequately sedated. Patient is in restraints.

## 2018-06-23 NOTE — Progress Notes (Signed)
When performing arterial stick patient become extremely combative and was kicking and thrashing around. Overall behavior was very hostile and antagonistic. Due to this behavior resemblance, needle went deep into patient wrist and now huge hematoma and swelling is noted on right radial artery site. Pressure was applied but patient continues to thrash around.

## 2018-06-23 NOTE — ED Notes (Signed)
Pt emergently intubated by Dr. Rhunette Croft. Milas Gain, RT, Dr. Karna Christmas, RN, Dahlia Client, NT at bedside. 0352 Etomidate 15 mg pushed by Arlys John, RN [phone] Rocuronium 50 mg pushed by Arlys John, RN 0255 7.5 ET tube placed by Dr. Rhunette Croft.  24 cm at teeth, +color change, verified lung sounds b/l by Dr. Rhunette Croft. OG tube placed and portable x-ray obtained to verify placement of ET/OG tube.

## 2018-06-23 NOTE — Procedures (Signed)
History: 38 year old female being evaluated for seizure and altered mental status  Sedation: Propofol and fentanyl  Technique: This is a 21 channel routine scalp EEG performed at the bedside with bipolar and monopolar montages arranged in accordance to the international 10/20 system of electrode placement. One channel was dedicated to EKG recording.    Background: The background consists of generalized irregular delta activity with some superimposed bilaterally frontally predominant beta activity as well as intermittent sleep spindles.  This pattern is present throughout the recording.  Photic stimulation: Physiologic driving is not performed  EEG Abnormalities: Sedated EEG  Clinical Interpretation: This EEG is consistent with a sedated EEG. There was no seizure or seizure predisposition recorded on this study. Please note that lack of epileptiform activity on EEG does not preclude the possibility of epilepsy.   Ritta Slot, MD [facility] [phone]  If 7pm- 7am, please page neurology on call as listed in AMION.

## 2018-06-23 NOTE — Progress Notes (Addendum)
NAME:  Sally StacksJennifer M Rowe, MRN:  [id number], DOB:  [birth year], LOS: 0 ADMISSION DATE:  06/22/2018, CONSULTATION DATE:  06/23/2018 REFERRING MD:  ED, CHIEF COMPLAINT:  AMS  Brief History   62F with history of HIV (CD4 in 06/2017 920, VL undetectable) and polysubstance abuse, seizure presented with acute encephalopathy, possible seizure activity.  Past Medical History  HIV (CD4 in 06/2017 920, VL undetectable) and polysubstance abuse, seizure, migraine  Significant Hospital Events   1/25 > Intubated in ED, admit to ICU  Consults:  None  Procedures:  None  Significant Diagnostic Tests:  CT head w/o contrast on 1/25: normal CXR on 1/25: clear  Micro Data:  BCx 1/25>>  Antimicrobials:  Vanc 1/25 >  Ceftriaxone 1/25 > Acyclovir 1/25 >  Interim history/subjective:  Remains on vent, sedation.  Objective   Blood pressure 104/72, pulse (!) 58, temperature 98.7 F (37.1 C), temperature source Oral, resp. rate 16, height 5' 5.5" (1.664 m), weight 48 kg, last menstrual period 01/08/2016, SpO2 95 %.    Vent Mode: PRVC FiO2 (%):  [40 %-60 %] 60 % Set Rate:  [16 bmp] 16 bmp Vt Set:  [460 mL] 460 mL PEEP:  [5 cmH20] 5 cmH20 Plateau Pressure:  [15 cmH20] 15 cmH20   Intake/Output Summary (Last 24 hours) at 06/23/2018 1057 Last data filed at 06/23/2018 0559 Gross per 24 hour  Intake 1900 ml  Output 500 ml  Net 1400 ml   Filed Weights   06/23/18 0008  Weight: 48 kg    Examination:  General - sedated Eyes - pupils reactive ENT - neck supple Cardiac - regular rate/rhythm, no murmur Chest - equal breath sounds b/l, no wheezing or rales Abdomen - soft, non tender, + bowel sounds Extremities - decreased muscle bulk Skin - no rashes Neuro - RASS -2  CXR - no ASD (reviewed by me)     Assessment & Plan:  62F with history of HIV (CD4 in 06/2017 920, VL undetectable) and polysubstance abuse, seizure presented with acute encephalopathy, possible seizure activity.  Acute  encephalopathy, possible seizure: History of HIV but well controlled. Fever to 100.5. Hemodynamically stable. WBC 23.3. UDS positive for THC and benzos. UA with ketones, negative nitrite or leukocytes. CT head neg.  Doubt meningitis. Plan - RASS goal 0 - narrow Abx - defer LP  Metabolic acidosis with increased anion gap, lactic acidosis: Bicarb 19 with anion gap 17. LA 2.5. Received IV fluids in ED Plan - f/u BMET  Acute respiratory failure with compromised airway. Plan - full vent support  Continue home baclofen, Tivicay, Descovy, gabapentin, Lamictal Hold home Aimovig, Subutex, Movantik, naproxen, propranolol, maxalt, trazodone  Best practice:  Diet: NPO DVT prophylaxis: SubQ Lovenox GI prophylaxis: Pepcid Glucose control: monitor Code Status: FULL Family Communication: no family at bedside  Labs   CBC: Recent Labs  Lab 06/23/18 0012 06/23/18 0412 06/23/18 0507  WBC 23.3*  --  15.5*  NEUTROABS 20.2*  --   --   HGB 13.5 11.6* 11.6*  HCT 42.4 34.0* 37.4  MCV 98.8  --  100.8*  PLT 230  --  154    Basic Metabolic Panel: Recent Labs  Lab 06/23/18 0012 06/23/18 0412 06/23/18 0507  NA 138 138 137  K 4.0 3.7 4.1  CL 102  --  108  CO2 19*  --  20*  GLUCOSE 121*  --  121*  BUN 15  --  9  CREATININE 0.92  --  0.76  CALCIUM 9.0  --  7.7*  MG  --   --  1.9  PHOS  --   --  2.8   GFR: Estimated Creatinine Clearance: 72.3 mL/min (by C-G formula based on SCr of 0.76 mg/dL). Recent Labs  Lab 06/23/18 0012 06/23/18 0013 06/23/18 0237 06/23/18 0507  WBC 23.3*  --   --  15.5*  LATICACIDVEN  --  2.5* 2.5* 2.6*    Liver Function Tests: Recent Labs  Lab 06/23/18 0012  AST 35  ALT 24  ALKPHOS 54  BILITOT 0.7  PROT 7.3  ALBUMIN 4.4   ABG    Component Value Date/Time   PHART 7.367 06/23/2018 0412   PCO2ART 35.0 06/23/2018 0412   PO2ART 64.0 (L) 06/23/2018 0412   HCO3 19.9 (L) 06/23/2018 0412   TCO2 21 (L) 06/23/2018 0412   ACIDBASEDEF 4.0 (H) 06/23/2018  0412   O2SAT 90.0 06/23/2018 0412     Coagulation Profile: Recent Labs  Lab 06/23/18 0012  INR 1.12    HbA1C: Hgb A1c MFr Bld  Date/Time Value Ref Range Status  12/24/2014 08:06 AM 5.6 4.8 - 5.6 % Final    Comment:             Pre-diabetes: 5.7 - 6.4          Diabetes: >6.4          Glycemic control for adults with diabetes: <7.0   01/08/2008 05:25 AM   Final   5.2 (NOTE)   The ADA recommends the following therapeutic goal for glycemic   control related to Hgb A1C measurement:   Goal of Therapy:   < 7.0% Hgb A1C   Reference: American Diabetes Association: Clinical Practice   Recommendations 2008, Diabetes Care,  2008, 31:(Suppl 1).   CC time 31 minutes  Coralyn HellingVineet Gorge Almanza, MD [facility] 06/23/2018, 11:01 AM

## 2018-06-23 NOTE — ED Triage Notes (Signed)
Pt bib [facility] from home w/ AMS, unresponsive, possible seizure activity.  Upon EMS arrival pt was confused running around home possibly post ictal.  Pt has hx of polysubstance abuse.  EMS also reported empty subutex and gabapentin bottles around pt.  Pt became combative en route was given versed and haldol.
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
## 2018-06-23 NOTE — Progress Notes (Signed)
Hypoglycemic Event  CBG: 64  Treatment: 25mls D50  Symptoms: asymptomatic  Follow-up CBG: Time: 0951 CBG Result: 133  Possible Reasons for Event: no food/ tube feeding   Comments/MD notified: Craige CottaSood, MD    Helyn NumbersMorgan S Eternity Dexter

## 2018-06-23 NOTE — Progress Notes (Addendum)
Pharmacy Antibiotic Note  [name] is a 38 y.o. female admitted on 06/22/2018 with meningitis.  Pharmacy has been consulted for vancomycin and acyclovir dosing. Vancomycin 1gm and acyclovir 500 mg ordered in the ED  Plan: Continue vancomycin 500 mg IV q8 hours Continue acyclovir 480 mg IV q8 hours F/u renal function, cultures and clinical course  Height: 5' 5.5" (166.4 cm) Weight: 105 lb 13.1 oz (48 kg) IBW/kg (Calculated) : 58.15  Temp (24hrs), Avg:100.5 F (38.1 C), Min:100.5 F (38.1 C), Max:100.5 F (38.1 C)  Recent Labs  Lab 06/23/18 0012 06/23/18 0013 06/23/18 0237  WBC 23.3*  --   --   CREATININE 0.92  --   --   LATICACIDVEN  --  2.5* 2.5*    Estimated Creatinine Clearance: 62.8 mL/min (by C-G formula based on SCr of 0.92 mg/dL).    Allergies  Allergen Reactions  . Glycopyrrolate Rash     Thank you for allowing pharmacy to be a part of this patient's care.  Talbert Cage Poteet 06/23/2018 5:40 AM

## 2018-06-24 ENCOUNTER — Inpatient Hospital Stay (HOSPITAL_COMMUNITY): Payer: Medicare Other

## 2018-06-24 DIAGNOSIS — J9601 Acute respiratory failure with hypoxia: Secondary | ICD-10-CM

## 2018-06-24 DIAGNOSIS — R4182 Altered mental status, unspecified: Secondary | ICD-10-CM

## 2018-06-24 DIAGNOSIS — E440 Moderate protein-calorie malnutrition: Secondary | ICD-10-CM

## 2018-06-24 LAB — URINE CULTURE: Culture: NO GROWTH

## 2018-06-24 LAB — CBC
HCT: 33.3 % — ABNORMAL LOW (ref 36.0–46.0)
Hemoglobin: 10.9 g/dL — ABNORMAL LOW (ref 12.0–15.0)
MCH: 32.8 pg (ref 26.0–34.0)
MCHC: 32.7 g/dL (ref 30.0–36.0)
MCV: 100.3 fL — ABNORMAL HIGH (ref 80.0–100.0)
Platelets: 125 10*3/uL — ABNORMAL LOW (ref 150–400)
RBC: 3.32 MIL/uL — ABNORMAL LOW (ref 3.87–5.11)
RDW: 13 % (ref 11.5–15.5)
WBC: 7.8 10*3/uL (ref 4.0–10.5)
nRBC: 0 % (ref 0.0–0.2)

## 2018-06-24 LAB — GLUCOSE, CAPILLARY
GLUCOSE-CAPILLARY: 99 mg/dL (ref 70–99)
GLUCOSE-CAPILLARY: 90 mg/dL (ref 70–99)
Glucose-Capillary: 83 mg/dL (ref 70–99)
Glucose-Capillary: 87 mg/dL (ref 70–99)
Glucose-Capillary: 81 mg/dL (ref 70–99)
Glucose-Capillary: 119 mg/dL — ABNORMAL HIGH (ref 70–99)
Glucose-Capillary: 98 mg/dL (ref 70–99)

## 2018-06-24 LAB — BASIC METABOLIC PANEL
Anion gap: 6 (ref 5–15)
BUN: 10 mg/dL (ref 6–20)
CO2: 22 mmol/L (ref 22–32)
CREATININE: 0.61 mg/dL (ref 0.44–1.00)
Calcium: 8.1 mg/dL — ABNORMAL LOW (ref 8.9–10.3)
Chloride: 115 mmol/L — ABNORMAL HIGH (ref 98–111)
GFR calc Af Amer: >60 mL/min (ref >60)
GFR calc non Af Amer: >60 mL/min (ref >60)
GLUCOSE: 88 mg/dL (ref 70–99)
Potassium: 3.3 mmol/L — ABNORMAL LOW (ref 3.5–5.1)
Sodium: 143 mmol/L (ref 135–145)

## 2018-06-24 LAB — MAGNESIUM
Magnesium: 2.1 mg/dL (ref 1.7–2.4)
Magnesium: 2.1 mg/dL (ref 1.7–2.4)

## 2018-06-24 LAB — PHOSPHORUS
Phosphorus: 2.1 mg/dL — ABNORMAL LOW (ref 2.5–4.6)
Phosphorus: 3.1 mg/dL (ref 2.5–4.6)

## 2018-06-24 MED ORDER — SODIUM PHOSPHATES 45 MMOLE/15ML IV SOLN
10.0000 mmol | Freq: Once | INTRAVENOUS | Status: AC
  Administered 2018-06-24: 10 mmol via INTRAVENOUS
  Filled 2018-06-24: qty 3.33

## 2018-06-24 MED ORDER — PRO-STAT SUGAR FREE PO LIQD
30.0000 mL | Freq: Two times a day (BID) | ORAL | Status: DC
  Administered 2018-06-24 – 2018-06-25 (×3): 30 mL
  Filled 2018-06-24 (×3): qty 30

## 2018-06-24 MED ORDER — POTASSIUM CHLORIDE 20 MEQ/15ML (10%) PO SOLN
20.0000 meq | ORAL | Status: AC
  Administered 2018-06-24 (×2): 20 meq
  Filled 2018-06-24 (×2): qty 15

## 2018-06-24 MED ORDER — VITAL HIGH PROTEIN PO LIQD: 1000.0000 mL | ORAL | Status: DC
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
## 2018-06-25 ENCOUNTER — Inpatient Hospital Stay (HOSPITAL_COMMUNITY): Payer: Medicare Other

## 2018-06-25 DIAGNOSIS — T50904D Poisoning by unspecified drugs, medicaments and biological substances, undetermined, subsequent encounter: Secondary | ICD-10-CM

## 2018-06-25 DIAGNOSIS — G9340 Encephalopathy, unspecified: Secondary | ICD-10-CM

## 2018-06-25 LAB — COMPREHENSIVE METABOLIC PANEL
ALT: 22 U/L (ref 0–44)
AST: 21 U/L (ref 15–41)
Albumin: 2.7 g/dL — ABNORMAL LOW (ref 3.5–5.0)
Alkaline Phosphatase: 37 U/L — ABNORMAL LOW (ref 38–126)
Anion gap: 3 — ABNORMAL LOW (ref 5–15)
BILIRUBIN TOTAL: 0.5 mg/dL (ref 0.3–1.2)
BUN: 12 mg/dL (ref 6–20)
CO2: 22 mmol/L (ref 22–32)
Calcium: 8 mg/dL — ABNORMAL LOW (ref 8.9–10.3)
Chloride: 117 mmol/L — ABNORMAL HIGH (ref 98–111)
Creatinine, Ser: 0.59 mg/dL (ref 0.44–1.00)
GFR calc Af Amer: >60 mL/min (ref >60)
GFR calc non Af Amer: >60 mL/min (ref >60)
Glucose, Bld: 116 mg/dL — ABNORMAL HIGH (ref 70–99)
Potassium: 3.6 mmol/L (ref 3.5–5.1)
Sodium: 142 mmol/L (ref 135–145)
Total Protein: 4.8 g/dL — ABNORMAL LOW (ref 6.5–8.1)

## 2018-06-25 LAB — CBC
HCT: 32.2 % — ABNORMAL LOW (ref 36.0–46.0)
Hemoglobin: 10.1 g/dL — ABNORMAL LOW (ref 12.0–15.0)
MCH: 31.7 pg (ref 26.0–34.0)
MCHC: 31.4 g/dL (ref 30.0–36.0)
MCV: 100.9 fL — ABNORMAL HIGH (ref 80.0–100.0)
Platelets: 113 10*3/uL — ABNORMAL LOW (ref 150–400)
RBC: 3.19 MIL/uL — ABNORMAL LOW (ref 3.87–5.11)
RDW: 13 % (ref 11.5–15.5)
WBC: 6.3 10*3/uL (ref 4.0–10.5)
nRBC: 0 % (ref 0.0–0.2)

## 2018-06-25 LAB — GLUCOSE, CAPILLARY
GLUCOSE-CAPILLARY: 110 mg/dL — AB (ref 70–99)
Glucose-Capillary: 88 mg/dL (ref 70–99)
Glucose-Capillary: 94 mg/dL (ref 70–99)
Glucose-Capillary: 85 mg/dL (ref 70–99)
Glucose-Capillary: 97 mg/dL (ref 70–99)
Glucose-Capillary: 147 mg/dL — ABNORMAL HIGH (ref 70–99)

## 2018-06-25 LAB — T-HELPER CELLS (CD4) COUNT (NOT AT ARMC)
CD4 % Helper T Cell: 34 % (ref 33–55)
CD4 T Cell Abs: 680 /uL (ref 400–2700)

## 2018-06-25 LAB — HIV-1 RNA QUANT-NO REFLEX-BLD
HIV 1 RNA Quant: <20 copies/mL
LOG10 HIV-1 RNA: UNDETERMINED log10copy/mL

## 2018-06-25 LAB — LAMOTRIGINE LEVEL: LAMOTRIGINE LVL: 1.8 ug/mL — AB (ref 2.0–20.0)

## 2018-06-25 MED ORDER — DEXMEDETOMIDINE HCL IN NACL 400 MCG/100ML IV SOLN: 0.0000 ug/kg/h | INTRAVENOUS | Status: DC

## 2018-06-25 MED ORDER — SODIUM CHLORIDE 0.9 % IV SOLN
1.0000 mg/h | INTRAVENOUS | Status: DC
  Administered 2018-06-25: 1 mg/h via INTRAVENOUS
  Filled 2018-06-25: qty 5

## 2018-06-25 MED ORDER — ONDANSETRON HCL 4 MG/2ML IJ SOLN
INTRAMUSCULAR | Status: AC
  Filled 2018-06-25: qty 2

## 2018-06-25 MED ORDER — MIDAZOLAM 50MG/50ML (1MG/ML) PREMIX INFUSION: 0.5000 mg/h | INTRAVENOUS | Status: DC

## 2018-06-25 MED ORDER — HYDROMORPHONE HCL 1 MG/ML IJ SOLN
INTRAMUSCULAR | Status: AC
  Filled 2018-06-25: qty 2

## 2018-06-25 MED ORDER — HYDROMORPHONE HCL 1 MG/ML IJ SOLN
2.0000 mg | Freq: Once | INTRAMUSCULAR | Status: AC
  Administered 2018-06-25: 2 mg via INTRAVENOUS

## 2018-06-25 MED ORDER — ONDANSETRON HCL 4 MG/2ML IJ SOLN
4.0000 mg | Freq: Four times a day (QID) | INTRAMUSCULAR | Status: DC | PRN
  Administered 2018-06-25 – 2018-06-27 (×5): 4 mg via INTRAVENOUS
  Filled 2018-06-25 (×4): qty 2

## 2018-06-25 MED ORDER — FAMOTIDINE IN NACL 20-0.9 MG/50ML-% IV SOLN
20.0000 mg | Freq: Two times a day (BID) | INTRAVENOUS | Status: DC
  Administered 2018-06-25: 20 mg via INTRAVENOUS
  Filled 2018-06-25 (×2): qty 50

## 2018-06-25 MED ORDER — DEXMEDETOMIDINE HCL IN NACL 400 MCG/100ML IV SOLN
0.4000 ug/kg/h | INTRAVENOUS | Status: DC
  Administered 2018-06-25: 0.4 ug/kg/h via INTRAVENOUS
  Filled 2018-06-25: qty 100

## 2018-06-25 NOTE — Procedures (Signed)
Extubation Procedure Note  Patient Details:   Name: [name] DOB: May 30, 1981 MRN: [id number]   Airway Documentation:    Vent end date: 06/25/18 Vent end time: 1650   Evaluation  O2 sats: stable throughout Complications: No apparent complications Patient did tolerate procedure well. Bilateral Breath Sounds: Clear, Diminished   Yes   Patient extubated to a 4L [place]. CCM and RN at bedside with RT during extubation. No stridor was heard. Patient was very agitated upon extubation but overall tolerated well.  Darolyn Rua 06/25/2018, 4:57 PM
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
## 2018-06-25 NOTE — Progress Notes (Signed)
Contacted E-link to transition meds to IV form as pt no longer intubated, NPO.

## 2018-06-25 NOTE — Progress Notes (Signed)
eLink Physician-Brief Progress Note Patient Name: [name] DOB: 21-Feb-1981 MRN: [id number]   Date of Service  06/25/2018  HPI/Events of Note  Patient extubated earlier today not able to take Pepcid PO.   eICU Interventions  Will order: 1. D/C Pepcid PO. 2. Pepcid IV.      Intervention Category Major Interventions: Other:  Lenell Antu 06/25/2018, 10:11 PM

## 2018-06-26 LAB — CBC
HCT: 32.7 % — ABNORMAL LOW (ref 36.0–46.0)
Hemoglobin: 10.4 g/dL — ABNORMAL LOW (ref 12.0–15.0)
MCH: 31 pg (ref 26.0–34.0)
MCHC: 31.8 g/dL (ref 30.0–36.0)
MCV: 97.6 fL (ref 80.0–100.0)
NRBC: 0 % (ref 0.0–0.2)
Platelets: 125 10*3/uL — ABNORMAL LOW (ref 150–400)
RBC: 3.35 MIL/uL — ABNORMAL LOW (ref 3.87–5.11)
RDW: 12.5 % (ref 11.5–15.5)
WBC: 7.7 10*3/uL (ref 4.0–10.5)

## 2018-06-26 LAB — GLUCOSE, CAPILLARY
Glucose-Capillary: 107 mg/dL — ABNORMAL HIGH (ref 70–99)
Glucose-Capillary: 85 mg/dL (ref 70–99)
Glucose-Capillary: 103 mg/dL — ABNORMAL HIGH (ref 70–99)
Glucose-Capillary: 89 mg/dL (ref 70–99)
Glucose-Capillary: 142 mg/dL — ABNORMAL HIGH (ref 70–99)

## 2018-06-26 LAB — BASIC METABOLIC PANEL
Anion gap: 10 (ref 5–15)
BUN: 5 mg/dL — ABNORMAL LOW (ref 6–20)
CO2: 24 mmol/L (ref 22–32)
Calcium: 8.5 mg/dL — ABNORMAL LOW (ref 8.9–10.3)
Chloride: 109 mmol/L (ref 98–111)
Creatinine, Ser: 0.51 mg/dL (ref 0.44–1.00)
GFR calc Af Amer: >60 mL/min (ref >60)
GFR calc non Af Amer: >60 mL/min (ref >60)
Glucose, Bld: 107 mg/dL — ABNORMAL HIGH (ref 70–99)
Potassium: 3.6 mmol/L (ref 3.5–5.1)
Sodium: 143 mmol/L (ref 135–145)

## 2018-06-26 LAB — TRIGLYCERIDES: Triglycerides: 99 mg/dL (ref <150)

## 2018-06-26 MED ORDER — BUPRENORPHINE HCL-NALOXONE HCL 8-2 MG SL SUBL
1.0000 | SUBLINGUAL_TABLET | Freq: Every day | SUBLINGUAL | Status: DC
  Administered 2018-06-26 – 2018-06-27 (×2): 1 via SUBLINGUAL
  Filled 2018-06-26 (×2): qty 1

## 2018-06-26 MED ORDER — GABAPENTIN 300 MG PO CAPS
300.0000 mg | ORAL_CAPSULE | Freq: Three times a day (TID) | ORAL | Status: DC
  Administered 2018-06-26 – 2018-06-27 (×4): 300 mg via ORAL
  Filled 2018-06-26 (×4): qty 1
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
## 2018-06-26 NOTE — Progress Notes (Signed)
Report given to RN on 3W. Patient transferred via wheelchair to [phone]. Suicide Sitter with patient. Patient's brother notified of transfer. Patient will be transferred as Med/Surg and thus transported off of telemetry.   Noe Gens, RN

## 2018-06-26 NOTE — Progress Notes (Signed)
Gave pt belongings  which was one bag to brother to take home.  Clothes is what was in the bag.  Erick Blinksuchman, Darrik Richman D, RN

## 2018-06-27 DIAGNOSIS — J9690 Respiratory failure, unspecified, unspecified whether with hypoxia or hypercapnia: Secondary | ICD-10-CM

## 2018-06-27 DIAGNOSIS — F39 Unspecified mood [affective] disorder: Secondary | ICD-10-CM

## 2018-06-27 LAB — BASIC METABOLIC PANEL
Anion gap: 10 (ref 5–15)
BUN: <5 mg/dL — ABNORMAL LOW (ref 6–20)
CO2: 25 mmol/L (ref 22–32)
CREATININE: 0.59 mg/dL (ref 0.44–1.00)
Calcium: 9.7 mg/dL (ref 8.9–10.3)
Chloride: 109 mmol/L (ref 98–111)
GFR calc non Af Amer: >60 mL/min (ref >60)
Glucose, Bld: 102 mg/dL — ABNORMAL HIGH (ref 70–99)
Potassium: 3.5 mmol/L (ref 3.5–5.1)
Sodium: 144 mmol/L (ref 135–145)

## 2018-06-27 LAB — CBC
HCT: 38.5 % (ref 36.0–46.0)
Hemoglobin: 12.8 g/dL (ref 12.0–15.0)
MCH: 32.2 pg (ref 26.0–34.0)
MCHC: 33.2 g/dL (ref 30.0–36.0)
MCV: 97 fL (ref 80.0–100.0)
Platelets: 197 10*3/uL (ref 150–400)
RBC: 3.97 MIL/uL (ref 3.87–5.11)
RDW: 12.3 % (ref 11.5–15.5)
WBC: 9.8 10*3/uL (ref 4.0–10.5)
nRBC: 0 % (ref 0.0–0.2)

## 2018-06-27 MED ORDER — ENSURE ENLIVE PO LIQD: 237.0000 mL | Freq: Three times a day (TID) | ORAL | Status: DC

## 2018-06-27 MED ORDER — BUPRENORPHINE HCL-NALOXONE HCL 8-2 MG SL SUBL
1.0000 | SUBLINGUAL_TABLET | Freq: Three times a day (TID) | SUBLINGUAL | Status: DC
  Administered 2018-06-27: 1 via SUBLINGUAL
  Filled 2018-06-27: qty 1

## 2018-06-27 NOTE — Care Management Note (Signed)
Case Management Note  Patient Details  Name: CRUSITA PILLE MRN: [id number] Date of Birth: [birth year]  Subjective/Objective:                    Action/Plan: Pt discharging home with self care. Pt has hospital f/u, insurance and transportation home.  Expected Discharge Date:  06/27/18               Expected Discharge Plan:  Home/Self Care  In-House Referral:     Discharge planning Services     Post Acute Care Choice:    Choice offered to:     DME Arranged:    DME Agency:     HH Arranged:    HH Agency:     Status of Service:  Completed, signed off  If discussed at [employer] of Stay Meetings, dates discussed:    Additional Comments:  Kermit Balo, RN 06/27/2018, 3:57 PM

## 2018-06-27 NOTE — Discharge Summary (Signed)
Physician Discharge Summary  [name]:[id number] DOB: 06/06/1980 DOA: 06/22/2018  PCP: Porfirio Oar, PA  Admit date: 06/22/2018 Discharge date: 06/27/2018  Admitted From: Home  Disposition:  Home   Recommendations for Outpatient Follow-up:  1. Follow up with Neurology tomorrow 2. Please follow up with PCP in 1-2 weeks 3. Please follow up on the following pending results: lamictal level repeat     Home Health: None  Equipment/Devices: None  Discharge Condition: Good  CODE STATUS: FULL Diet recommendation: Regular  Brief/Interim Summary: [name] is a 38 yo F with HIV well controlled, polysubstance abuse in remission on Suboxone, and seizure disorder who presented with acute encephalopathy, possible seizure activity.  Per report, family left patient around 1PM normal but feeling vague malaise.  At 10PM they returned home, found her minimally responsive, surrounded by vomitus on couch.  Patient was disoriented and combative and was brought to the ER, where she was given IM Haldol and Versed, and subsequently required intubation for airway protection.  Fever 100.76F on admission, CXR clear.       PRINCIPAL HOSPITAL DIAGNOSIS: Seizure    Discharge Diagnoses:   Seizure Post-ictal confusion/encephalopathy Patient initially presented with encephalopathy, unclear cause.  Required intubation for airway protection in ER.  Her encephalopathy completely resolved after extubation.  Patient later noted biting on inside of mouth, tongue.  CT and MRI brain unremarkable.  EEG obtained.     Patient endorsed adherence to her Lamictal and gabapentin, but her Lamictal level slightly low at 1.8 at admission.  Driving precautions given.     Suicidality ruled out Patient emphatically denied suicidality nor depression.  Evaluated by Psychiatry, who agreed.    HIV VL undetectable, CD4 count normal.  ART was continued.  Substance abuse disorder in remission Suboxone was  continued.  UDS positive for THC, benzodiazepines (which she was given by EMS).          Discharge Instructions  Discharge Instructions    Diet general   Complete by:  As directed    Discharge instructions   Complete by:  As directed    From Dr. Maryfrances Bunnell: You were admitted for seizure.   While you were here, you needed to be intubated (and put on a ventilator) because you were so weak and confused after your seizure, and initially, it was thought that you might have intentionally overdosed.  While you were here, you had an EEG to test the electrical activity of your brain and an MRI to look at your brain, and neither looked unusual.  You had no more seizures, and were restarted on your Lamictal and gabapentin.  Take Lamictal 200 mg twice daily Take gabapentin 300 mg three times daily (do not increase or decrease this dose without talking to Dr. Lucia Gaskins).   Call Dr. Trevor Mace office today to schedule an appointment for tomorrow (Thursday) or as soon as possible. Let her know to follow up your lamictal level drawn today.   Increase activity slowly   Complete by:  As directed      Allergies as of 06/27/2018      Reactions   Glycopyrrolate Rash      Medication List    TAKE these medications   AIMOVIG 140 MG/ML Soaj Generic drug:  Erenumab-aooe INJECT 140MG  INTO THE SKIN EVERY 30 DAYS What changed:  See the new instructions.   albuterol 108 (90 Base) MCG/ACT inhaler Commonly known as:  PROVENTIL HFA;VENTOLIN HFA Inhale 2 puffs into the lungs every 6 (six) hours  as needed for wheezing.   baclofen 10 MG tablet Commonly known as:  LIORESAL Take 1 tablet (10 mg total) by mouth 3 (three) times daily.   buprenorphine 8 MG Subl SL tablet Commonly known as:  SUBUTEX Place 8 mg under the tongue 3 (three) times daily.   dolutegravir 50 MG tablet Commonly known as:  TIVICAY Take 1 tablet (50 mg total) by mouth daily.   emtricitabine-tenofovir AF 200-25 MG tablet Commonly  known as:  DESCOVY Take 1 tablet by mouth daily.   gabapentin 300 MG capsule Commonly known as:  NEURONTIN TAKE 1 CAPSULE BY MOUTH 3 TIMES DAILY. TAKE 1-2 TABLETS UP TO THREE TIMES A DAY FOR NEURALGIA. What changed:    how much to take  how to take this  when to take this  reasons to take this  additional instructions   lamoTRIgine 200 MG tablet Commonly known as:  LAMICTAL Take 1 tablet (200 mg total) by mouth 2 (two) times daily.   meloxicam 15 MG tablet Commonly known as:  MOBIC Take 15 mg by mouth daily as needed for pain.   naloxegol oxalate 25 MG Tabs tablet Commonly known as:  MOVANTIK Take 1 tablet (25 mg total) by mouth daily.   naproxen 375 MG tablet Commonly known as:  NAPROSYN Take 1 tablet (375 mg total) by mouth 2 (two) times daily. What changed:    when to take this  reasons to take this   ondansetron 4 MG disintegrating tablet Commonly known as:  ZOFRAN-ODT Take 1 tablet (4 mg total) by mouth every 8 (eight) hours as needed for nausea.   propranolol 40 MG tablet Commonly known as:  INDERAL Take 1 tablet (40 mg total) by mouth daily as needed. What changed:  reasons to take this   rizatriptan 10 MG tablet Commonly known as:  MAXALT Take 1 tablet (10 mg total) by mouth as needed for migraine. May repeat in 2 hours if needed What changed:    when to take this  additional instructions   traZODone 100 MG tablet Commonly known as:  DESYREL Take 100 mg by mouth at bedtime as needed for sleep.   tretinoin 0.05 % cream Commonly known as:  RETIN-A Apply 1 application topically daily.       Allergies  Allergen Reactions  . Glycopyrrolate Rash    Consultations:  Psychiatry  Critical Care   Procedures/Studies: Ct Head Wo Contrast  Result Date: 06/23/2018 CLINICAL DATA:  Altered level of consciousness. EXAM: CT HEAD WITHOUT CONTRAST TECHNIQUE: Contiguous axial images were obtained from the base of the skull through the vertex  without intravenous contrast. COMPARISON:  05/18/2015 FINDINGS: Brain: No evidence of acute infarction, hemorrhage, hydrocephalus, extra-axial collection or mass lesion/mass effect. Vascular: No hyperdense vessel or unexpected calcification. Skull: Normal. Negative for fracture or focal lesion. Sinuses/Orbits: Mild maxillary and ethmoid sinus mucosal thickening. Intact orbits and globes. Clear mastoids. Other: None. IMPRESSION: Normal head CT Electronically Signed   By: Tollie Ethavid  Kwon M.D.   On: 06/23/2018 00:47   Mr Laqueta JeanBrain W RUWo Contrast  Result Date: 06/23/2018 CLINICAL DATA:  Acute presentation with seizure and cephalopathy. HIV infection. EXAM: MRI HEAD WITHOUT AND WITH CONTRAST TECHNIQUE: Multiplanar, multiecho pulse sequences of the brain and surrounding structures were obtained without and with intravenous contrast. CONTRAST:  4.5 cc Gadavist COMPARISON:  Head CT same day.  MRI 04/10/2016.  MRI 12/10/2014. FINDINGS: Brain: The brain has a normal appearance without evidence of malformation, atrophy, old or acute small or  large vessel infarction, mass lesion, hemorrhage, hydrocephalus or extra-axial collection. Mesial temporal lobes appear normal and symmetric. After contrast administration abnormal enhancement occurs region studied. Vascular: Major vessels at the base of the brain show flow. Venous sinuses appear patent. Skull and upper cervical spine: Normal. Sinuses/Orbits: Seasonal mucosal inflammatory changes of paranasal sinuses. Orbits negative. Other: None significant. IMPRESSION: Normal examination. No cause of seizure identified. No imaging finding of HIV infection. Mild mucosal thickening of the sinuses, frequently seen during this season. Electronically Signed   By: Paulina FusiMark  Shogry M.D.   On: 06/23/2018 16:12   Dg Chest Port 1 View  Result Date: 06/25/2018 CLINICAL DATA:  Shortness of breath EXAM: PORTABLE CHEST 1 VIEW COMPARISON:  Yesterday FINDINGS: Endotracheal tube tip just below the clavicular  heads. An orogastric tube reaches the stomach. The lungs remain clear. Normal heart size. No effusion or pneumothorax. IMPRESSION: 1. Stable hardware positioning. 2. Stable clear lungs. Electronically Signed   By: Marnee SpringJonathon  Watts M.D.   On: 06/25/2018 06:35   Dg Chest Port 1 View  Result Date: 06/24/2018 CLINICAL DATA:  Respiratory failure EXAM: PORTABLE CHEST 1 VIEW COMPARISON:  Chest x-rays dated 06/23/2018. FINDINGS: Endotracheal tube remains well positioned with tip approximately 2-3 cm above the carina. Enteric tube passes below the diaphragm. Lungs are clear. No pleural effusion or pneumothorax seen heart size and mediastinal contours are stable. IMPRESSION: 1. Endotracheal tube well positioned with tip approximately 2-3 cm above the carina. 2. Lungs are clear and there is no evidence of acute cardiopulmonary abnormality. Electronically Signed   By: Bary RichardStan  Maynard M.D.   On: 06/24/2018 06:23   Dg Chest Portable 1 View  Result Date: 06/23/2018 CLINICAL DATA:  Intubation EXAM: PORTABLE CHEST 1 VIEW COMPARISON:  06/23/2018 FINDINGS: Endotracheal tube tip is at the level of the clavicular heads. Orogastric tube tip and side port project over the gastric body. Lungs are clear. Normal cardiomediastinal contours. IMPRESSION: 1. Radiographically appropriate position of endotracheal and orogastric tubes. 2. Clear lungs. Electronically Signed   By: Deatra RobinsonKevin  Herman M.D.   On: 06/23/2018 03:29   Dg Chest Port 1 View  Result Date: 06/23/2018 CLINICAL DATA:  Altered mental status and vomiting EXAM: PORTABLE CHEST 1 VIEW COMPARISON:  02/29/2016 FINDINGS: The heart size and mediastinal contours are within normal limits. Both lungs are clear. The visualized skeletal structures are unremarkable. IMPRESSION: No active disease. Electronically Signed   By: Deatra RobinsonKevin  Herman M.D.   On: 06/23/2018 00:59   EEG History: 38 year old female being evaluated for seizure and altered mental status  Sedation: Propofol and  fentanyl  Technique: This is a 21 channel routine scalp EEG performed at the bedside with bipolar and monopolar montages arranged in accordance to the international 10/20 system of electrode placement. One channel was dedicated to EKG recording.    Background: The background consists of generalized irregular delta activity with some superimposed bilaterally frontally predominant beta activity as well as intermittent sleep spindles.  This pattern is present throughout the recording.  Photic stimulation: Physiologic driving is not performed  EEG Abnormalities: Sedated EEG  Clinical Interpretation: This EEG is consistent with a sedated EEG. There was no seizure or seizure predisposition recorded on this study. Please note that lack of epileptiform activity on EEG does not preclude the possibility of epilepsy.      Subjective: Feeling fine.  At baseline.  No further seizures, no confusion.  No fever, cough, sputum.  Discharge Exam: Vitals:   06/27/18 0742 06/27/18 1148  BP: (!) 144/99 Marland Kitchen(!)  149/98  Pulse: 66 68  Resp: 18 20  Temp: 98.3 F (36.8 C) 98.2 F (36.8 C)  SpO2:  100%   Vitals:   06/27/18 0021 06/27/18 0407 06/27/18 0742 06/27/18 1148  BP: (!) 155/106 121/70 (!) 144/99 (!) 149/98  Pulse: 76 72 66 68  Resp: (!) 24  18 20   Temp: 98.9 F (37.2 C) 99 F (37.2 C) 98.3 F (36.8 C) 98.2 F (36.8 C)  TempSrc: Oral Oral Oral Oral  SpO2: 100%   100%  Weight:      Height:        General: Pt is alert, awake, not in acute distress Cardiovascular: RRR, nl S1-S2, no murmurs appreciated.   No LE edema.   Respiratory: Normal respiratory rate and rhythm.  CTAB without rales or wheezes. Abdominal: Abdomen soft and non-tender.  No distension or HSM.   Neuro/Psych: Strength symmetric in upper and lower extremities.  Judgment and insight appear normal.   The results of significant diagnostics from this hospitalization (including imaging, microbiology, ancillary and laboratory)  are listed below for reference.     Microbiology: Recent Results (from the past 240 hour(s))  Blood Culture (routine x 2)     Status: None (Preliminary result)   Collection Time: 06/23/18 12:09 AM  Result Value Ref Range Status   Specimen Description BLOOD RIGHT HAND  Final   Special Requests   Final    BOTTLES DRAWN AEROBIC AND ANAEROBIC Blood Culture results may not be optimal due to an inadequate volume of blood received in culture bottles   Culture   Final    NO GROWTH 4 DAYS Performed at [facility], [address] [place], [place] [address]    Report Status PENDING  Incomplete  Blood Culture (routine x 2)     Status: None (Preliminary result)   Collection Time: 06/23/18 12:13 AM  Result Value Ref Range Status   Specimen Description BLOOD RIGHT FOREARM  Final   Special Requests   Final    BOTTLES DRAWN AEROBIC AND ANAEROBIC Blood Culture adequate volume   Culture   Final    NO GROWTH 4 DAYS Performed at [facility], [address], [place], [place] [address]    Report Status PENDING  Incomplete  MRSA PCR Screening     Status: None   Collection Time: 06/23/18  9:11 AM  Result Value Ref Range Status   MRSA by PCR NEGATIVE NEGATIVE Final    Comment:        The GeneXpert MRSA Assay (FDA approved for NASAL specimens only), is one component of a comprehensive MRSA colonization surveillance program. It is not intended to diagnose MRSA infection nor to guide or monitor treatment for MRSA infections. Performed at [facility], [address] [place], [place] [address]   Culture, Urine     Status: None   Collection Time: 06/23/18 11:09 AM  Result Value Ref Range Status   Specimen Description URINE, RANDOM  Final   Special Requests   Final    NONE Performed at [facility], [address] [place], [place] [address]    Culture NO GROWTH  Final   Report Status 06/24/2018 FINAL  Final     Labs: BNP (last 3 results) No results for input(s):  BNP in the last 8760 hours. Basic Metabolic Panel: Recent Labs  Lab 06/23/18 0507 06/23/18 1225 06/23/18 1646 06/24/18 0640 06/24/18 1635 06/25/18 0231 06/26/18 0645 06/27/18 0507  NA 137  --   --  143  --  142 143 144  K 4.1  --   --  3.3*  --  3.6 3.6 3.5  CL 108  --   --  115*  --  117* 109 109  CO2 20*  --   --  22  --  22 24 25   GLUCOSE 121*  --   --  88  --  116* 107* 102*  BUN 9  --   --  10  --  12 5* <5*  CREATININE 0.76  --   --  0.61  --  0.59 0.51 0.59  CALCIUM 7.7*  --   --  8.1*  --  8.0* 8.5* 9.7  MG 1.9 2.1 2.0 2.1 2.1  --   --   --   PHOS 2.8 2.0* 2.3* 2.1* 3.1  --   --   --    Liver Function Tests: Recent Labs  Lab 06/23/18 0012 06/25/18 0231  AST 35 21  ALT 24 22  ALKPHOS 54 37*  BILITOT 0.7 0.5  PROT 7.3 4.8*  ALBUMIN 4.4 2.7*   No results for input(s): LIPASE, AMYLASE in the last 168 hours. No results for input(s): AMMONIA in the last 168 hours. CBC: Recent Labs  Lab 06/23/18 0012  06/23/18 0507 06/24/18 0640 06/25/18 0231 06/26/18 0645 06/27/18 0507  WBC 23.3*  --  15.5* 7.8 6.3 7.7 9.8  NEUTROABS 20.2*  --   --   --   --   --   --   HGB 13.5   < > 11.6* 10.9* 10.1* 10.4* 12.8  HCT 42.4   < > 37.4 33.3* 32.2* 32.7* 38.5  MCV 98.8  --  100.8* 100.3* 100.9* 97.6 97.0  PLT 230  --  154 125* 113* 125* 197   < > = values in this interval not displayed.   Cardiac Enzymes: No results for input(s): CKTOTAL, CKMB, CKMBINDEX, TROPONINI in the last 168 hours. BNP: Invalid input(s): POCBNP CBG: Recent Labs  Lab 06/26/18 0343 06/26/18 0803 06/26/18 1214 06/26/18 1651 06/26/18 1920  GLUCAP 107* 85 103* 89 142*   D-Dimer No results for input(s): DDIMER in the last 72 hours. Hgb A1c No results for input(s): HGBA1C in the last 72 hours. Lipid Profile Recent Labs    06/26/18 0400  TRIG 99   Thyroid function studies No results for input(s): TSH, T4TOTAL, T3FREE, THYROIDAB in the last 72 hours.  Invalid input(s): FREET3 Anemia work  up No results for input(s): VITAMINB12, FOLATE, FERRITIN, TIBC, IRON, RETICCTPCT in the last 72 hours. Urinalysis    Component Value Date/Time   COLORURINE YELLOW 06/23/2018 0012   APPEARANCEUR CLEAR 06/23/2018 0012   LABSPEC 1.018 06/23/2018 0012   PHURINE 7.0 06/23/2018 0012   GLUCOSEU 50 (A) 06/23/2018 0012   HGBUR NEGATIVE 06/23/2018 0012   BILIRUBINUR NEGATIVE 06/23/2018 0012   BILIRUBINUR negative 08/13/2015 1653   KETONESUR 5 (A) 06/23/2018 0012   PROTEINUR 30 (A) 06/23/2018 0012   UROBILINOGEN 0.2 08/13/2015 1653   UROBILINOGEN 0.2 03/24/2015 1959   NITRITE NEGATIVE 06/23/2018 0012   LEUKOCYTESUR NEGATIVE 06/23/2018 0012   Sepsis Labs Invalid input(s): PROCALCITONIN,  WBC,  LACTICIDVEN Microbiology Recent Results (from the past 240 hour(s))  Blood Culture (routine x 2)     Status: None (Preliminary result)   Collection Time: 06/23/18 12:09 AM  Result Value Ref Range Status   Specimen Description BLOOD RIGHT HAND  Final   Special Requests   Final    BOTTLES DRAWN  AEROBIC AND ANAEROBIC Blood Culture results may not be optimal due to an inadequate volume of blood received in culture bottles   Culture   Final    NO GROWTH 4 DAYS Performed at [facility], [address] [place], [place] [address]    Report Status PENDING  Incomplete  Blood Culture (routine x 2)     Status: None (Preliminary result)   Collection Time: 06/23/18 12:13 AM  Result Value Ref Range Status   Specimen Description BLOOD RIGHT FOREARM  Final   Special Requests   Final    BOTTLES DRAWN AEROBIC AND ANAEROBIC Blood Culture adequate volume   Culture   Final    NO GROWTH 4 DAYS Performed at [facility], [address], [place], [place] [address]    Report Status PENDING  Incomplete  MRSA PCR Screening     Status: None   Collection Time: 06/23/18  9:11 AM  Result Value Ref Range Status   MRSA by PCR NEGATIVE NEGATIVE Final    Comment:        The GeneXpert MRSA Assay (FDA approved  for NASAL specimens only), is one component of a comprehensive MRSA colonization surveillance program. It is not intended to diagnose MRSA infection nor to guide or monitor treatment for MRSA infections. Performed at [facility], [address], [place], [place] [address]   Culture, Urine     Status: None   Collection Time: 06/23/18 11:09 AM  Result Value Ref Range Status   Specimen Description URINE, RANDOM  Final   Special Requests   Final    NONE Performed at [facility], [address] [place], [place] [address]    Culture NO GROWTH  Final   Report Status 06/24/2018 FINAL  Final     Time coordinating discharge: 40 minutes       SIGNED:   Alberteen Sam, MD  [facility] 06/27/2018, 3:45 PM
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
## 2018-06-27 NOTE — Progress Notes (Signed)
Nutrition Follow-up  DOCUMENTATION CODES:   Non-severe (moderate) malnutrition in context of social or environmental circumstances, Underweight  INTERVENTION:  Provide Ensure Enlive po TID, each supplement provides 350 kcal and 20 grams of protein.  Encourage adequate PO intake.   NUTRITION DIAGNOSIS:   Inadequate oral intake related to inability to eat as evidenced by NPO status; diet advanced; improved  GOAL:   Patient will meet greater than or equal to 90% of their needs; progressing  MONITOR:   PO intake, Supplement acceptance, Labs, Weight trends, Skin, I & O's  REASON FOR ASSESSMENT:   Consult Enteral/tube feeding initiation and management  ASSESSMENT:   38 y/o w/ pmhx HIV, polysubstance abuse, seizure w/ acute encephalopathy. Found unresponsive at home w/ empty bottles around her. When EMS arrived pt became erratic, requiring haldol/versed. Extremely combative in ED, requiring physical/chemocal restraints. Intubated for airway protection.  Extubated 1/27.  Pt reports having a good appetite. Meal completion has been 15-25%. Pt reports dislike of hospital food. Pt agreeable to nutritional supplements to aid in caloric and protein needs. Labs and medications reviewed.   Diet Order:   Diet Order            Diet regular Room service appropriate? Yes; Fluid consistency: Thin  Diet effective now              EDUCATION NEEDS:   No education needs have been identified at this time  Skin:  Skin Assessment: Reviewed RN Assessment  Last BM:  1/29  Height:   Ht Readings from Last 1 Encounters:  06/23/18 5\' 5"  (1.651 m)    Weight:   Wt Readings from Last 1 Encounters:  06/26/18 50.4 kg    Ideal Body Weight:  58 kg  BMI:  Body mass index is 18.49 kg/m.  Estimated Nutritional Needs:   Kcal:  1700-1900  Protein:  75-90 grams  Fluid:  1.7 - 1.9 L/day    Roslyn Smiling, MS, RD, LDN Pager # [phone] After hours/ weekend pager # [phone]

## 2018-06-27 NOTE — Progress Notes (Signed)
NURSING PROGRESS NOTE  SHIRIN DEMKE [id number] Discharge Data: 06/27/2018 6:45 PM Attending Provider: Alberteen Sam, * ERD:[id number], Graham, PA     Rupert Stacks to be D/C'd Home per MD order.  Discussed with the patient the After Visit Summary and all questions fully answered. All IV's discontinued with no bleeding noted. All belongings returned to patient for patient to take home.   Last Vital Signs:  Blood pressure (!) 152/95, pulse 76, temperature 98.1 F (36.7 C), temperature source Oral, resp. rate 20, height 5\' 5"  (1.651 m), weight 50.4 kg, last menstrual period 01/08/2016, SpO2 100 %.  Discharge Medication List Allergies as of 06/27/2018      Reactions   Glycopyrrolate Rash      Medication List    TAKE these medications   AIMOVIG 140 MG/ML Soaj Generic drug:  Erenumab-aooe INJECT 140MG  INTO THE SKIN EVERY 30 DAYS What changed:  See the new instructions.   albuterol 108 (90 Base) MCG/ACT inhaler Commonly known as:  PROVENTIL HFA;VENTOLIN HFA Inhale 2 puffs into the lungs every 6 (six) hours as needed for wheezing.   baclofen 10 MG tablet Commonly known as:  LIORESAL Take 1 tablet (10 mg total) by mouth 3 (three) times daily.   buprenorphine 8 MG Subl SL tablet Commonly known as:  SUBUTEX Place 8 mg under the tongue 3 (three) times daily.   dolutegravir 50 MG tablet Commonly known as:  TIVICAY Take 1 tablet (50 mg total) by mouth daily.   emtricitabine-tenofovir AF 200-25 MG tablet Commonly known as:  DESCOVY Take 1 tablet by mouth daily.   gabapentin 300 MG capsule Commonly known as:  NEURONTIN TAKE 1 CAPSULE BY MOUTH 3 TIMES DAILY. TAKE 1-2 TABLETS UP TO THREE TIMES A DAY FOR NEURALGIA. What changed:    how much to take  how to take this  when to take this  reasons to take this  additional instructions   lamoTRIgine 200 MG tablet Commonly known as:  LAMICTAL Take 1 tablet (200 mg total) by mouth 2 (two) times daily.    meloxicam 15 MG tablet Commonly known as:  MOBIC Take 15 mg by mouth daily as needed for pain.   naloxegol oxalate 25 MG Tabs tablet Commonly known as:  MOVANTIK Take 1 tablet (25 mg total) by mouth daily.   naproxen 375 MG tablet Commonly known as:  NAPROSYN Take 1 tablet (375 mg total) by mouth 2 (two) times daily. What changed:    when to take this  reasons to take this   ondansetron 4 MG disintegrating tablet Commonly known as:  ZOFRAN-ODT Take 1 tablet (4 mg total) by mouth every 8 (eight) hours as needed for nausea.   propranolol 40 MG tablet Commonly known as:  INDERAL Take 1 tablet (40 mg total) by mouth daily as needed. What changed:  reasons to take this   rizatriptan 10 MG tablet Commonly known as:  MAXALT Take 1 tablet (10 mg total) by mouth as needed for migraine. May repeat in 2 hours if needed What changed:    when to take this  additional instructions   traZODone 100 MG tablet Commonly known as:  DESYREL Take 100 mg by mouth at bedtime as needed for sleep.   tretinoin 0.05 % cream Commonly known as:  RETIN-A Apply 1 application topically daily.

## 2018-06-28 ENCOUNTER — Ambulatory Visit (INDEPENDENT_AMBULATORY_CARE_PROVIDER_SITE_OTHER): Payer: Medicare Other | Admitting: Neurology

## 2018-06-28 ENCOUNTER — Encounter: Payer: Self-pay | Admitting: Neurology

## 2018-06-28 VITALS — BP 159/97 | HR 120 | Ht 66.0 in | Wt 101.0 lb

## 2018-06-28 DIAGNOSIS — G40009 Localization-related (focal) (partial) idiopathic epilepsy and epileptic syndromes with seizures of localized onset, not intractable, without status epilepticus: Secondary | ICD-10-CM

## 2018-06-28 LAB — CULTURE, BLOOD (ROUTINE X 2)
CULTURE: NO GROWTH
Culture: NO GROWTH
Special Requests: ADEQUATE

## 2018-06-28 MED ORDER — LAMOTRIGINE 25 MG PO TABS: 50.0000 mg | ORAL_TABLET | Freq: Two times a day (BID) | ORAL | 6 refills | Status: DC

## 2018-06-28 NOTE — Patient Instructions (Signed)
Increase Lamictal to 250mg  twice daily  Lamotrigine tablets What is this medicine? LAMOTRIGINE (la MOE Patrecia Pace) is used to control seizures in adults and children with epilepsy and Lennox-Gastaut syndrome. It is also used in adults to treat bipolar disorder. This medicine may be used for other purposes; ask your health care provider or pharmacist if you have questions. COMMON BRAND NAME(S): Lamictal, Subvenite What should I tell my health care provider before I take this medicine? They need to know if you have any of these conditions: -aseptic meningitis during prior use of lamotrigine -depression -folate deficiency -kidney disease -liver disease -suicidal thoughts, plans, or attempt; a previous suicide attempt by you or a family member -an unusual or allergic reaction to lamotrigine or other seizure medications, other medicines, foods, dyes, or preservatives -pregnant or trying to get pregnant -breast-feeding How should I use this medicine? Take this medicine by mouth with a glass of water. Follow the directions on the prescription label. Do not chew these tablets. If this medicine upsets your stomach, take it with food or milk. Take your doses at regular intervals. Do not take your medicine more often than directed. A special MedGuide will be given to you by the pharmacist with each new prescription and refill. Be sure to read this information carefully each time. Talk to your pediatrician regarding the use of this medicine in children. While this drug may be prescribed for children as young as 2 years for selected conditions, precautions do apply. Overdosage: If you think you have taken too much of this medicine contact a poison control center or emergency room at once. NOTE: This medicine is only for you. Do not share this medicine with others. What if I miss a dose? If you miss a dose, take it as soon as you can. If it is almost time for your next dose, take only that dose. Do not take  double or extra doses. What may interact with this medicine? -atazanavir -carbamazepine -female hormones, including contraceptive or birth control pills -lopinavir -methotrexate -phenobarbital -phenytoin -primidone -pyrimethamine -rifampin -ritonavir -trimethoprim -valproic acid This list may not describe all possible interactions. Give your health care provider a list of all the medicines, herbs, non-prescription drugs, or dietary supplements you use. Also tell them if you smoke, drink alcohol, or use illegal drugs. Some items may interact with your medicine. What should I watch for while using this medicine? Visit your doctor or health care professional for regular checks on your progress. If you take this medicine for seizures, wear a Medic Alert bracelet or necklace. Carry an identification card with information about your condition, medicines, and doctor or health care professional. It is important to take this medicine exactly as directed. When first starting treatment, your dose will need to be adjusted slowly. It may take weeks or months before your dose is stable. You should contact your doctor or health care professional if your seizures get worse or if you have any new types of seizures. Do not stop taking this medicine unless instructed by your doctor or health care professional. Stopping your medicine suddenly can increase your seizures or their severity. Contact your doctor or health care professional right away if you develop a rash while taking this medicine. Rashes may be very severe and sometimes require treatment in the hospital. Deaths from rashes have occurred. Serious rashes occur more often in children than adults taking this medicine. It is more common for these serious rashes to occur during the first 2 months of  treatment, but a rash can occur at any time. You may get drowsy, dizzy, or have blurred vision. Do not drive, use machinery, or do anything that needs mental  alertness until you know how this medicine affects you. To reduce dizzy or fainting spells, do not sit or stand up quickly, especially if you are an older patient. Alcohol can increase drowsiness and dizziness. Avoid alcoholic drinks. If you are taking this medicine for bipolar disorder, it is important to report any changes in your mood to your doctor or health care professional. If your condition gets worse, you get mentally depressed, feel very hyperactive or manic, have difficulty sleeping, or have thoughts of hurting yourself or committing suicide, you need to get help from your health care professional right away. If you are a caregiver for someone taking this medicine for bipolar disorder, you should also report these behavioral changes right away. The use of this medicine may increase the chance of suicidal thoughts or actions. Pay special attention to how you are responding while on this medicine. Your mouth may get dry. Chewing sugarless gum or sucking hard candy, and drinking plenty of water may help. Contact your doctor if the problem does not go away or is severe. Women who become pregnant while using this medicine may enroll in the [place] [employer] Antiepileptic Drug Pregnancy Registry by calling [phone]. This registry collects information about the safety of antiepileptic drug use during pregnancy. This medicine may cause a decrease in folic acid. You should make sure that you get enough folic acid while you are taking this medicine. Discuss the foods you eat and the vitamins you take with your health care professional. What side effects may I notice from receiving this medicine? Side effects that you should report to your doctor or health care professional as soon as possible: -allergic reactions like skin rash, itching or hives, swelling of the face, lips, or tongue -changes in vision -depressed mood -elevated mood, decreased need for sleep, racing thoughts, impulsive behavior -fever  with rash, swollen lymph nodes, or swelling of the face -loss of balance or coordination -mouth sores -redness, blistering, peeling or loosening of the skin, including inside the mouth -right upper belly pain -seizures -severe muscle pain -signs and symptoms of aseptic meningitis such as stiff neck and sensitivity to light, headache, drowsiness, fever, nausea, vomiting, rash -signs of infection - fever or chills, cough, sore throat, pain or difficulty passing urine -suicidal thoughts or other mood changes -swollen lymph nodes -trouble walking -unusual bruising or bleeding -unusually weak or tired -yellowing of the eyes or skin Side effects that usually do not require medical attention (report to your doctor or health care professional if they continue or are bothersome): -diarrhea -dizziness -dry mouth -stuffy nose -tiredness -tremors -trouble sleeping This list may not describe all possible side effects. Call your doctor for medical advice about side effects. You may report side effects to FDA at 1-800-FDA-1088. Where should I keep my medicine? Keep out of reach of children. Store at room temperature between 15 and 30 degrees C (59 and 86 degrees F). Throw away any unused medicine after the expiration date. NOTE: This sheet is a summary. It may not cover all possible information. If you have questions about this medicine, talk to your doctor, pharmacist, or health care provider.  2019 [employer] (2017-01-02 16:07:39)

## 2018-06-28 NOTE — Progress Notes (Signed)
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 or reduce headache pain.    Proceed to emergency room if you experience new or worsening symptoms or symptoms do not resolve, if you have new neurologic symptoms or if headache is severe, or for any concerning symptom.   Provided education and documentation from [employer] toolbox including articles on: chronic migraine medication overuse headache, chronic migraines, prevention of migraines, behavioral and other nonpharmacologic treatments for headache.    Naomie DeanAntonia Launi Asencio, MD  [facility] [address] [place], [place] [id number]  Phone [phone] Fax [phone]  A total of 25 minutes was spent face-to-face with this patient. Over half this time was spent on counseling patient on the   1. Partial idiopathic epilepsy with seizures of localized onset, not intractable, without status epilepticus (HCC)    diagnosis and different therapeutic options available.

## 2018-06-29 LAB — LAMOTRIGINE LEVEL: Lamotrigine Lvl: 10.7 ug/mL (ref 2.0–20.0)

## 2018-06-29 MED FILL — DESCOVY 200-25 MG TABS: 200-25 | 30 days supply | Qty: 30 | Fill #11

## 2018-06-29 MED FILL — TIVICAY 50 MG TABLET: 50 | 30 days supply | Qty: 30 | Fill #11

## 2018-07-10 ENCOUNTER — Other Ambulatory Visit: Payer: Medicare Other

## 2018-07-18 ENCOUNTER — Encounter: Payer: Self-pay | Admitting: Neurology

## 2018-07-18 ENCOUNTER — Ambulatory Visit (INDEPENDENT_AMBULATORY_CARE_PROVIDER_SITE_OTHER): Payer: Medicare Other | Admitting: Neurology

## 2018-07-18 VITALS — BP 151/102 | HR 79

## 2018-07-18 DIAGNOSIS — G43709 Chronic migraine without aura, not intractable, without status migrainosus: Secondary | ICD-10-CM | POA: Diagnosis not present

## 2018-07-18 DIAGNOSIS — Z79899 Other long term (current) drug therapy: Secondary | ICD-10-CM | POA: Diagnosis not present

## 2018-07-18 DIAGNOSIS — G43009 Migraine without aura, not intractable, without status migrainosus: Secondary | ICD-10-CM | POA: Diagnosis not present

## 2018-07-18 DIAGNOSIS — T402X5A Adverse effect of other opioids, initial encounter: Secondary | ICD-10-CM

## 2018-07-18 DIAGNOSIS — G40009 Localization-related (focal) (partial) idiopathic epilepsy and epileptic syndromes with seizures of localized onset, not intractable, without status epilepticus: Secondary | ICD-10-CM

## 2018-07-18 DIAGNOSIS — K5903 Drug induced constipation: Secondary | ICD-10-CM

## 2018-07-18 DIAGNOSIS — G500 Trigeminal neuralgia: Secondary | ICD-10-CM

## 2018-07-18 DIAGNOSIS — R000 Tachycardia, unspecified: Secondary | ICD-10-CM

## 2018-07-18 MED ORDER — NALDEMEDINE TOSYLATE 0.2 MG PO TABS: 0.2000 mg | ORAL_TABLET | Freq: Every day | ORAL | 3 refills | Status: DC

## 2018-07-18 MED ORDER — PROPRANOLOL HCL ER 80 MG PO CP24: 80.0000 mg | ORAL_CAPSULE | Freq: Every day | ORAL | 3 refills | Status: DC

## 2018-07-18 NOTE — Patient Instructions (Signed)
Lamotrigine tablets What is this medicine? LAMOTRIGINE (la MOE tri jeen) is used to control seizures in adults and children with epilepsy and Lennox-Gastaut syndrome. It is also used in adults to treat bipolar disorder. This medicine may be used for other purposes; ask your health care provider or pharmacist if you have questions. COMMON BRAND NAME(S): Lamictal, Subvenite What should I tell my health care provider before I take this medicine? They need to know if you have any of these conditions: -aseptic meningitis during prior use of lamotrigine -depression -folate deficiency -kidney disease -liver disease -suicidal thoughts, plans, or attempt; a previous suicide attempt by you or a family member -an unusual or allergic reaction to lamotrigine or other seizure medications, other medicines, foods, dyes, or preservatives -pregnant or trying to get pregnant -breast-feeding How should I use this medicine? Take this medicine by mouth with a glass of water. Follow the directions on the prescription label. Do not chew these tablets. If this medicine upsets your stomach, take it with food or milk. Take your doses at regular intervals. Do not take your medicine more often than directed. A special MedGuide will be given to you by the pharmacist with each new prescription and refill. Be sure to read this information carefully each time. Talk to your pediatrician regarding the use of this medicine in children. While this drug may be prescribed for children as young as 2 years for selected conditions, precautions do apply. Overdosage: If you think you have taken too much of this medicine contact a poison control center or emergency room at once. NOTE: This medicine is only for you. Do not share this medicine with others. What if I miss a dose? If you miss a dose, take it as soon as you can. If it is almost time for your next dose, take only that dose. Do not take double or extra doses. What may interact  with this medicine? -atazanavir -carbamazepine -female hormones, including contraceptive or birth control pills -lopinavir -methotrexate -phenobarbital -phenytoin -primidone -pyrimethamine -rifampin -ritonavir -trimethoprim -valproic acid This list may not describe all possible interactions. Give your health care provider a list of all the medicines, herbs, non-prescription drugs, or dietary supplements you use. Also tell them if you smoke, drink alcohol, or use illegal drugs. Some items may interact with your medicine. What should I watch for while using this medicine? Visit your doctor or health care professional for regular checks on your progress. If you take this medicine for seizures, wear a Medic Alert bracelet or necklace. Carry an identification card with information about your condition, medicines, and doctor or health care professional. It is important to take this medicine exactly as directed. When first starting treatment, your dose will need to be adjusted slowly. It may take weeks or months before your dose is stable. You should contact your doctor or health care professional if your seizures get worse or if you have any new types of seizures. Do not stop taking this medicine unless instructed by your doctor or health care professional. Stopping your medicine suddenly can increase your seizures or their severity. Contact your doctor or health care professional right away if you develop a rash while taking this medicine. Rashes may be very severe and sometimes require treatment in the hospital. Deaths from rashes have occurred. Serious rashes occur more often in children than adults taking this medicine. It is more common for these serious rashes to occur during the first 2 months of treatment, but a rash can occur at   any time. You may get drowsy, dizzy, or have blurred vision. Do not drive, use machinery, or do anything that needs mental alertness until you know how this medicine  affects you. To reduce dizzy or fainting spells, do not sit or stand up quickly, especially if you are an older patient. Alcohol can increase drowsiness and dizziness. Avoid alcoholic drinks. If you are taking this medicine for bipolar disorder, it is important to report any changes in your mood to your doctor or health care professional. If your condition gets worse, you get mentally depressed, feel very hyperactive or manic, have difficulty sleeping, or have thoughts of hurting yourself or committing suicide, you need to get help from your health care professional right away. If you are a caregiver for someone taking this medicine for bipolar disorder, you should also report these behavioral changes right away. The use of this medicine may increase the chance of suicidal thoughts or actions. Pay special attention to how you are responding while on this medicine. Your mouth may get dry. Chewing sugarless gum or sucking hard candy, and drinking plenty of water may help. Contact your doctor if the problem does not go away or is severe. Women who become pregnant while using this medicine may enroll in the [place] Antiepileptic Drug Pregnancy Registry by calling 1-888-233-2334. This registry collects information about the safety of antiepileptic drug use during pregnancy. This medicine may cause a decrease in folic acid. You should make sure that you get enough folic acid while you are taking this medicine. Discuss the foods you eat and the vitamins you take with your health care professional. What side effects may I notice from receiving this medicine? Side effects that you should report to your doctor or health care professional as soon as possible: -allergic reactions like skin rash, itching or hives, swelling of the face, lips, or tongue -changes in vision -depressed mood -elevated mood, decreased need for sleep, racing thoughts, impulsive behavior -fever with rash, swollen lymph nodes, or  swelling of the face -loss of balance or coordination -mouth sores -redness, blistering, peeling or loosening of the skin, including inside the mouth -right upper belly pain -seizures -severe muscle pain -signs and symptoms of aseptic meningitis such as stiff neck and sensitivity to light, headache, drowsiness, fever, nausea, vomiting, rash -signs of infection - fever or chills, cough, sore throat, pain or difficulty passing urine -suicidal thoughts or other mood changes -swollen lymph nodes -trouble walking -unusual bruising or bleeding -unusually weak or tired -yellowing of the eyes or skin Side effects that usually do not require medical attention (report to your doctor or health care professional if they continue or are bothersome): -diarrhea -dizziness -dry mouth -stuffy nose -tiredness -tremors -trouble sleeping This list may not describe all possible side effects. Call your doctor for medical advice about side effects. You may report side effects to FDA at 1-800-FDA-1088. Where should I keep my medicine? Keep out of reach of children. Store at room temperature between 15 and 30 degrees C (59 and 86 degrees F). Throw away any unused medicine after the expiration date. NOTE: This sheet is a summary. It may not cover all possible information. If you have questions about this medicine, talk to your doctor, pharmacist, or health care provider.  2019 [employer] (2017-01-02 16:07:39)  

## 2018-07-18 NOTE — Progress Notes (Signed)
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     Discussed: To prevent or relieve headaches, try the following: Cool Compress. Lie down and place a cool compress on your head.  Avoid headache triggers. If certain foods or odors seem to have triggered your migraines in the past, avoid them. A headache diary might help you identify triggers.  Include physical activity in your daily routine. Try a daily walk or other moderate aerobic exercise.  Manage stress. Find healthy ways to cope with the stressors, such as delegating tasks on your to-do list.  Practice relaxation techniques. Try deep breathing, yoga, massage and visualization.  Eat regularly. Eating regularly scheduled meals and maintaining a healthy diet might help prevent headaches. Also, drink plenty of fluids.  Follow a regular sleep schedule. Sleep deprivation might contribute to headaches Consider biofeedback. With this mind-body technique, you learn to control certain bodily functions - such as muscle tension, heart rate and blood pressure - to prevent headaches or reduce  headache pain.    Proceed to emergency room if you experience new or worsening symptoms or symptoms do not resolve, if you have new neurologic symptoms or if headache is severe, or for any concerning symptom.   Provided education and documentation from American headache Society toolbox including articles on: chronic migraine medication overuse headache, chronic migraines, prevention of migraines, behavioral and other nonpharmacologic treatments for headache.    Naomie Dean, MD  [facility] [address] [place], [place] [address]  Phone [phone] Fax [phone]  A total of 45 minutes was spent face-to-face with this patient. Over half this time was spent on counseling patient on the   1. Long term use of drug   2. Chronic migraine w/o aura w/o status migrainosus, not intractable   3. Migraine without aura and without status migrainosus, not intractable   4. Constipation due to opioid therapy   5. Trigeminal neuralgia   6. Tachycardia   7. Partial idiopathic epilepsy with seizures of localized onset, not intractable, without status epilepticus (HCC)    diagnosis and different therapeutic options available.

## 2018-07-19 ENCOUNTER — Other Ambulatory Visit: Payer: Self-pay | Admitting: Infectious Disease

## 2018-07-20 LAB — LAMOTRIGINE LEVEL: Lamotrigine Lvl: 12.8 ug/mL (ref 2.0–20.0)

## 2018-07-24 ENCOUNTER — Encounter: Payer: Medicare Other | Admitting: Infectious Disease

## 2018-07-30 MED FILL — DESCOVY 200-25 MG TABS: 200-25 | 30 days supply | Qty: 30 | Fill #0

## 2018-07-30 MED FILL — TIVICAY 50 MG TABLET: 50 | 30 days supply | Qty: 30 | Fill #0

## 2018-08-08 ENCOUNTER — Other Ambulatory Visit: Payer: Self-pay

## 2018-08-08 ENCOUNTER — Other Ambulatory Visit: Payer: Medicare Other

## 2018-08-08 DIAGNOSIS — Z113 Encounter for screening for infections with a predominantly sexual mode of transmission: Secondary | ICD-10-CM

## 2018-08-08 DIAGNOSIS — Z79899 Other long term (current) drug therapy: Secondary | ICD-10-CM

## 2018-08-08 DIAGNOSIS — R569 Unspecified convulsions: Secondary | ICD-10-CM

## 2018-08-08 DIAGNOSIS — F339 Major depressive disorder, recurrent, unspecified: Secondary | ICD-10-CM

## 2018-08-08 DIAGNOSIS — B20 Human immunodeficiency virus [HIV] disease: Secondary | ICD-10-CM

## 2018-08-09 LAB — T-HELPER CELL (CD4) - (RCID CLINIC ONLY)
CD4 % Helper T Cell: 34 % (ref 33–55)
CD4 T Cell Abs: 830 /uL (ref 400–2700)

## 2018-08-10 LAB — COMPLETE METABOLIC PANEL WITH GFR
AG RATIO: 2 (calc) (ref 1.0–2.5)
ALT: 11 U/L (ref 6–29)
AST: 13 U/L (ref 10–30)
Albumin: 4.6 g/dL (ref 3.6–5.1)
Alkaline phosphatase (APISO): 73 U/L (ref 31–125)
BUN: 14 mg/dL (ref 7–25)
CALCIUM: 9.8 mg/dL (ref 8.6–10.2)
CO2: 29 mmol/L (ref 20–32)
Chloride: 103 mmol/L (ref 98–110)
Creat: 0.97 mg/dL (ref 0.50–1.10)
GFR, Est African American: 86 mL/min/{1.73_m2} (ref >=60)
GFR, Est Non African American: 74 mL/min/{1.73_m2} (ref >=60)
Globulin: 2.3 g/dL (calc) (ref 1.9–3.7)
Glucose, Bld: 134 mg/dL — ABNORMAL HIGH (ref 65–99)
Potassium: 4.9 mmol/L (ref 3.5–5.3)
Sodium: 141 mmol/L (ref 135–146)
Total Bilirubin: 0.4 mg/dL (ref 0.2–1.2)
Total Protein: 6.9 g/dL (ref 6.1–8.1)

## 2018-08-10 LAB — CBC WITH DIFFERENTIAL/PLATELET
Absolute Monocytes: 354 cells/uL (ref 200–950)
Basophils Absolute: 41 cells/uL (ref 0–200)
Basophils Relative: 0.7 %
Eosinophils Absolute: 254 cells/uL (ref 15–500)
Eosinophils Relative: 4.3 %
HCT: 41 % (ref 35.0–45.0)
HEMOGLOBIN: 13.9 g/dL (ref 11.7–15.5)
Lymphs Abs: 2319 cells/uL (ref 850–3900)
MCH: 32.4 pg (ref 27.0–33.0)
MCHC: 33.9 g/dL (ref 32.0–36.0)
MCV: 95.6 fL (ref 80.0–100.0)
MONOS PCT: 6 %
MPV: 8.9 fL (ref 7.5–12.5)
Neutro Abs: 2932 cells/uL (ref 1500–7800)
Neutrophils Relative %: 49.7 %
Platelets: 228 10*3/uL (ref 140–400)
RBC: 4.29 10*6/uL (ref 3.80–5.10)
RDW: 11.7 % (ref 11.0–15.0)
Total Lymphocyte: 39.3 %
WBC: 5.9 10*3/uL (ref 3.8–10.8)

## 2018-08-10 LAB — HIV-1 RNA QUANT-NO REFLEX-BLD
HIV 1 RNA Quant: <20 copies/mL
HIV-1 RNA Quant, Log: <1.3 Log copies/mL

## 2018-08-10 LAB — LIPID PANEL
Cholesterol: 221 mg/dL — ABNORMAL HIGH (ref <200)
HDL: 44 mg/dL — ABNORMAL LOW (ref >=50)
LDL Cholesterol (Calc): 157 mg/dL (calc) — ABNORMAL HIGH
Non-HDL Cholesterol (Calc): 177 mg/dL (calc) — ABNORMAL HIGH (ref <130)
Total CHOL/HDL Ratio: 5 (calc) — ABNORMAL HIGH (ref <5.0)
Triglycerides: 93 mg/dL (ref <150)

## 2018-08-10 LAB — RPR: RPR Ser Ql: NONREACTIVE

## 2018-08-22 ENCOUNTER — Ambulatory Visit (INDEPENDENT_AMBULATORY_CARE_PROVIDER_SITE_OTHER): Payer: Medicare Other | Admitting: Infectious Disease

## 2018-08-22 ENCOUNTER — Encounter: Payer: Self-pay | Admitting: Infectious Disease

## 2018-08-22 ENCOUNTER — Other Ambulatory Visit: Payer: Self-pay

## 2018-08-22 VITALS — BP 127/88 | HR 57 | Temp 98.0°F | Ht 66.0 in | Wt 108.0 lb

## 2018-08-22 DIAGNOSIS — E0590 Thyrotoxicosis, unspecified without thyrotoxic crisis or storm: Secondary | ICD-10-CM | POA: Insufficient documentation

## 2018-08-22 DIAGNOSIS — R739 Hyperglycemia, unspecified: Secondary | ICD-10-CM | POA: Diagnosis not present

## 2018-08-22 DIAGNOSIS — Z87898 Personal history of other specified conditions: Secondary | ICD-10-CM | POA: Diagnosis not present

## 2018-08-22 DIAGNOSIS — B20 Human immunodeficiency virus [HIV] disease: Secondary | ICD-10-CM

## 2018-08-22 DIAGNOSIS — G43709 Chronic migraine without aura, not intractable, without status migrainosus: Secondary | ICD-10-CM

## 2018-08-22 DIAGNOSIS — F1991 Other psychoactive substance use, unspecified, in remission: Secondary | ICD-10-CM

## 2018-08-22 DIAGNOSIS — Z113 Encounter for screening for infections with a predominantly sexual mode of transmission: Secondary | ICD-10-CM

## 2018-08-22 DIAGNOSIS — Z79899 Other long term (current) drug therapy: Secondary | ICD-10-CM

## 2018-08-22 HISTORY — DX: Hyperglycemia, unspecified: R73.9

## 2018-08-22 HISTORY — DX: Thyrotoxicosis, unspecified without thyrotoxic crisis or storm: E05.90
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
## 2018-08-23 LAB — HEMOGLOBIN A1C
Hgb A1c MFr Bld: 5.1 % of total Hgb (ref <5.7)
Mean Plasma Glucose: 100 (calc)
eAG (mmol/L): 5.5 (calc)

## 2018-08-23 MED FILL — DESCOVY 200-25 MG TABS: 200-25 | 30 days supply | Qty: 30 | Fill #1

## 2018-08-23 MED FILL — TIVICAY 50 MG TABLET: 50 | 30 days supply | Qty: 30 | Fill #1

## 2018-09-10 ENCOUNTER — Other Ambulatory Visit: Payer: Self-pay

## 2018-09-10 ENCOUNTER — Emergency Department (HOSPITAL_COMMUNITY): Payer: No Typology Code available for payment source

## 2018-09-10 ENCOUNTER — Emergency Department (HOSPITAL_COMMUNITY)
Admission: EM | Admit: 2018-09-10 | Discharge: 2018-09-10 | Disposition: A | Discharge status: Home / Self Care | Payer: No Typology Code available for payment source | Attending: Emergency Medicine | Admitting: Emergency Medicine

## 2018-09-10 DIAGNOSIS — B20 Human immunodeficiency virus [HIV] disease: Secondary | ICD-10-CM | POA: Insufficient documentation

## 2018-09-10 DIAGNOSIS — M79605 Pain in left leg: Secondary | ICD-10-CM

## 2018-09-10 DIAGNOSIS — F419 Anxiety disorder, unspecified: Secondary | ICD-10-CM | POA: Insufficient documentation

## 2018-09-10 DIAGNOSIS — Z9049 Acquired absence of other specified parts of digestive tract: Secondary | ICD-10-CM | POA: Insufficient documentation

## 2018-09-10 DIAGNOSIS — F259 Schizoaffective disorder, unspecified: Secondary | ICD-10-CM | POA: Diagnosis not present

## 2018-09-10 DIAGNOSIS — Z79899 Other long term (current) drug therapy: Secondary | ICD-10-CM | POA: Insufficient documentation

## 2018-09-10 DIAGNOSIS — M79652 Pain in left thigh: Secondary | ICD-10-CM | POA: Diagnosis present

## 2018-09-10 DIAGNOSIS — F329 Major depressive disorder, single episode, unspecified: Secondary | ICD-10-CM | POA: Diagnosis not present

## 2018-09-10 DIAGNOSIS — Y92410 Unspecified street and highway as the place of occurrence of the external cause: Secondary | ICD-10-CM | POA: Diagnosis not present

## 2018-09-10 MED ORDER — IBUPROFEN 200 MG PO TABS
600.0000 mg | ORAL_TABLET | Freq: Once | ORAL | Status: AC
  Administered 2018-09-10: 600 mg via ORAL
  Filled 2018-09-10: qty 3

## 2018-09-10 MED ORDER — CYCLOBENZAPRINE HCL 10 MG PO TABS: 10.0000 mg | ORAL_TABLET | Freq: Two times a day (BID) | ORAL | 0 refills | Status: DC | PRN

## 2018-09-10 NOTE — Discharge Instructions (Signed)
Please read instructions below. °Apply ice to your areas of pain for 20 minutes at a time. °You can take 600 mg of Advil/ibuprofen every 6 hours as needed for pain. °You can take flexeril every 12 hours as needed for muscle spasm. Be aware this medication can make you drowsy. °Schedule an appointment with your primary care provider to follow up on your visit today. °Return to the ER for severely worsening headache, vision changes, if new numbness or tingling in your arms or legs, inability to urinate, inability to hold your bowels, or weakness in your extremities.  ° °
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
## 2018-09-10 NOTE — ED Notes (Signed)
Patient given ice pack

## 2018-09-13 ENCOUNTER — Other Ambulatory Visit: Payer: Self-pay | Admitting: Infectious Disease

## 2018-09-17 MED FILL — DESCOVY 200-25 MG TABS: 200-25 | 30 days supply | Qty: 30 | Fill #0

## 2018-09-17 MED FILL — TIVICAY 50 MG TABLET: 50 | 30 days supply | Qty: 30 | Fill #0

## 2018-10-02 ENCOUNTER — Other Ambulatory Visit: Payer: Self-pay | Admitting: Neurology

## 2018-10-08 DIAGNOSIS — N816 Rectocele: Secondary | ICD-10-CM | POA: Insufficient documentation

## 2018-10-08 HISTORY — PX: RECTOCELE REPAIR: SHX761

## 2018-10-08 HISTORY — PX: OTHER SURGICAL HISTORY: SHX169

## 2018-10-11 ENCOUNTER — Encounter: Payer: Self-pay | Admitting: *Deleted

## 2018-10-11 ENCOUNTER — Telehealth: Payer: Self-pay | Admitting: *Deleted

## 2018-10-11 NOTE — Telephone Encounter (Signed)
[email or web address] link sent to [email or web address].

## 2018-10-11 NOTE — Telephone Encounter (Signed)
Spoke pt. She provided consent to do a doxy visit (d/t the covid19 pandemic). She consented to file the visit with insurance. She confirmed name & DOB and provided updates to her chart including meds and history. Pt had recent surgery on 5/11. She understands recommendation to use the latest version of chrome, firefox, or safari for the visit and is aware this website is secure and understands how to sign into the waiting room. Pt's number is [phone] and her carrier is [employer] however I used the att text service for a test and she received the message. Pt understands she will receive a call 30 minutes prior to her appt on Wed 5/20 to check-in and then she will join appt 5-10 minutes prior. Pt verbalized appreciation for the call.

## 2018-10-12 MED FILL — DESCOVY 200-25 MG TABS: 200-25 | 30 days supply | Qty: 30 | Fill #1

## 2018-10-12 MED FILL — TIVICAY 50 MG TABLET: 50 | 30 days supply | Qty: 30 | Fill #1

## 2018-10-17 ENCOUNTER — Other Ambulatory Visit: Payer: Self-pay

## 2018-10-17 ENCOUNTER — Ambulatory Visit (INDEPENDENT_AMBULATORY_CARE_PROVIDER_SITE_OTHER): Payer: Medicare Other | Admitting: Neurology

## 2018-10-17 DIAGNOSIS — G40009 Localization-related (focal) (partial) idiopathic epilepsy and epileptic syndromes with seizures of localized onset, not intractable, without status epilepticus: Secondary | ICD-10-CM | POA: Diagnosis not present

## 2018-10-17 DIAGNOSIS — G500 Trigeminal neuralgia: Secondary | ICD-10-CM

## 2018-10-17 DIAGNOSIS — G43709 Chronic migraine without aura, not intractable, without status migrainosus: Secondary | ICD-10-CM | POA: Diagnosis not present

## 2018-10-17 NOTE — Progress Notes (Signed)
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 helps with anxiety. On aimovig significantly improved.  Unclear nausea/vomiting and weight loss she is following with multiple specialists, discussed mesenteric insufficiency, also discussed POTs which she brought up, I'm not sure these explain all her symptoms but this is not my area of expertise. Still is very curious, medication related? We screened her for porphyria in the past was neg. She has a large amount of stool in her colon, she has opioid indced constipation tried Movantik which caused side effects. Will try a different agent.    Discussed: To prevent or relieve headaches, try the following: Cool Compress. Lie down and place a cool compress on your head.  Avoid headache triggers. If certain foods or odors seem to have triggered your migraines in the past, avoid them. A headache diary might help you identify triggers.  Include physical  activity in your daily routine. Try a daily walk or other moderate aerobic exercise.  Manage stress. Find healthy ways to cope with the stressors, such as delegating tasks on your to-do list.  Practice relaxation techniques. Try deep breathing, yoga, massage and visualization.  Eat regularly. Eating regularly scheduled meals and maintaining a healthy diet might help prevent headaches. Also, drink plenty of fluids.  Follow a regular sleep schedule. Sleep deprivation might contribute to headaches Consider biofeedback. With this mind-body technique, you learn to control certain bodily functions -- such as muscle tension, heart rate and blood pressure -- to prevent headaches or reduce headache pain.    Proceed to emergency room if you experience new or worsening symptoms or symptoms do not resolve, if you have new neurologic symptoms or if headache is severe, or for any concerning symptom.   Provided education and documentation from American headache Society toolbox including articles on: chronic migraine medication overuse headache, chronic migraines, prevention of migraines, behavioral and other nonpharmacologic treatments for headache.    Naomie Dean, MD  [facility] [address] [place], [place] [address]  Phone [phone] Fax [phone]

## 2018-10-25 ENCOUNTER — Other Ambulatory Visit: Payer: Self-pay

## 2018-10-25 MED ORDER — NALOXEGOL OXALATE 25 MG PO TABS: 25.0000 mg | ORAL_TABLET | Freq: Every day | ORAL | 3 refills | Status: DC

## 2018-11-12 ENCOUNTER — Other Ambulatory Visit: Payer: Self-pay | Admitting: Infectious Disease

## 2018-11-12 ENCOUNTER — Encounter: Payer: Self-pay | Admitting: Infectious Disease

## 2018-11-12 ENCOUNTER — Other Ambulatory Visit: Payer: Self-pay

## 2018-11-12 ENCOUNTER — Ambulatory Visit (INDEPENDENT_AMBULATORY_CARE_PROVIDER_SITE_OTHER): Payer: Medicare Other | Admitting: Infectious Disease

## 2018-11-12 VITALS — Wt 110.0 lb

## 2018-11-12 DIAGNOSIS — F17200 Nicotine dependence, unspecified, uncomplicated: Secondary | ICD-10-CM | POA: Diagnosis not present

## 2018-11-12 DIAGNOSIS — A310 Pulmonary mycobacterial infection: Secondary | ICD-10-CM

## 2018-11-12 DIAGNOSIS — B20 Human immunodeficiency virus [HIV] disease: Secondary | ICD-10-CM

## 2018-11-12 DIAGNOSIS — R569 Unspecified convulsions: Secondary | ICD-10-CM

## 2018-11-12 DIAGNOSIS — J449 Chronic obstructive pulmonary disease, unspecified: Secondary | ICD-10-CM | POA: Insufficient documentation

## 2018-11-12 DIAGNOSIS — J430 Unilateral pulmonary emphysema [MacLeod's syndrome]: Secondary | ICD-10-CM

## 2018-11-12 HISTORY — DX: Chronic obstructive pulmonary disease, unspecified: J44.9

## 2018-11-12 HISTORY — DX: Nicotine dependence, unspecified, uncomplicated: F17.200

## 2018-11-12 HISTORY — DX: Pulmonary mycobacterial infection: A31.0

## 2018-11-12 MED ORDER — BIKTARVY 50-200-25 MG PO TABS: 1.0000 | ORAL_TABLET | Freq: Every day | ORAL | 11 refills | Status: DC

## 2018-11-12 MED FILL — TIVICAY 50 MG TABLET: 50 | 30 days supply | Qty: 30 | Fill #2

## 2018-11-12 MED FILL — BIKTARVY 50-200-25 MG TABS: 50-200-25 | 30 days supply | Qty: 30 | Fill #0

## 2018-11-12 MED FILL — DESCOVY 200-25 MG TABS: 200-25 | 30 days supply | Qty: 30 | Fill #2
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
## 2018-11-22 ENCOUNTER — Other Ambulatory Visit: Payer: Self-pay | Admitting: Neurology

## 2018-11-22 DIAGNOSIS — Z79899 Other long term (current) drug therapy: Secondary | ICD-10-CM

## 2018-11-22 DIAGNOSIS — G43709 Chronic migraine without aura, not intractable, without status migrainosus: Secondary | ICD-10-CM

## 2018-12-06 MED FILL — BIKTARVY 50-200-25 MG TABS: 50-200-25 | 30 days supply | Qty: 30 | Fill #1

## 2019-01-08 MED FILL — BIKTARVY 50-200-25 MG TABS: 50-200-25 | 30 days supply | Qty: 30 | Fill #2

## 2019-01-14 ENCOUNTER — Other Ambulatory Visit: Payer: Self-pay

## 2019-01-14 ENCOUNTER — Other Ambulatory Visit: Payer: Medicare Other

## 2019-01-14 DIAGNOSIS — B20 Human immunodeficiency virus [HIV] disease: Secondary | ICD-10-CM

## 2019-01-15 LAB — T-HELPER CELL (CD4) - (RCID CLINIC ONLY)
CD4 % Helper T Cell: 38 % (ref 33–65)
CD4 T Cell Abs: 783 /uL (ref 400–1790)

## 2019-01-17 LAB — CBC WITH DIFFERENTIAL/PLATELET
Absolute Monocytes: 230 cells/uL (ref 200–950)
Basophils Absolute: 51 cells/uL (ref 0–200)
Basophils Relative: 1.1 %
Eosinophils Absolute: 193 cells/uL (ref 15–500)
Eosinophils Relative: 4.2 %
HCT: 42.1 % (ref 35.0–45.0)
Hemoglobin: 14.2 g/dL (ref 11.7–15.5)
Lymphs Abs: 2268 cells/uL (ref 850–3900)
MCH: 31.5 pg (ref 27.0–33.0)
MCHC: 33.7 g/dL (ref 32.0–36.0)
MCV: 93.3 fL (ref 80.0–100.0)
MPV: 9.5 fL (ref 7.5–12.5)
Monocytes Relative: 5 %
Neutro Abs: 1858 cells/uL (ref 1500–7800)
Neutrophils Relative %: 40.4 %
Platelets: 198 10*3/uL (ref 140–400)
RBC: 4.51 10*6/uL (ref 3.80–5.10)
RDW: 11.8 % (ref 11.0–15.0)
Total Lymphocyte: 49.3 %
WBC: 4.6 10*3/uL (ref 3.8–10.8)

## 2019-01-17 LAB — COMPLETE METABOLIC PANEL WITH GFR
AG Ratio: 2.3 (calc) (ref 1.0–2.5)
ALT: 10 U/L (ref 6–29)
AST: 16 U/L (ref 10–30)
Albumin: 4.8 g/dL (ref 3.6–5.1)
Alkaline phosphatase (APISO): 69 U/L (ref 31–125)
BUN: 10 mg/dL (ref 7–25)
CO2: 27 mmol/L (ref 20–32)
Calcium: 10.2 mg/dL (ref 8.6–10.2)
Chloride: 105 mmol/L (ref 98–110)
Creat: 0.9 mg/dL (ref 0.50–1.10)
GFR, Est African American: 93 mL/min/{1.73_m2} (ref >=60)
GFR, Est Non African American: 81 mL/min/{1.73_m2} (ref >=60)
Globulin: 2.1 g/dL (calc) (ref 1.9–3.7)
Glucose, Bld: 125 mg/dL — ABNORMAL HIGH (ref 65–99)
Potassium: 4.5 mmol/L (ref 3.5–5.3)
Sodium: 144 mmol/L (ref 135–146)
Total Bilirubin: 0.4 mg/dL (ref 0.2–1.2)
Total Protein: 6.9 g/dL (ref 6.1–8.1)

## 2019-01-17 LAB — HIV-1 RNA QUANT-NO REFLEX-BLD
HIV 1 RNA Quant: <20 copies/mL
HIV-1 RNA Quant, Log: <1.3 Log copies/mL

## 2019-01-17 LAB — RPR: RPR Ser Ql: NONREACTIVE

## 2019-01-18 ENCOUNTER — Other Ambulatory Visit: Payer: Self-pay | Admitting: Neurology

## 2019-01-21 ENCOUNTER — Other Ambulatory Visit: Payer: Medicare Other

## 2019-01-21 ENCOUNTER — Other Ambulatory Visit: Payer: Self-pay

## 2019-01-21 DIAGNOSIS — A310 Pulmonary mycobacterial infection: Secondary | ICD-10-CM

## 2019-01-21 NOTE — Addendum Note (Signed)
Addended by: Dolan Amen D on: 01/21/2019 03:31 PM   Modules accepted: Orders

## 2019-01-28 ENCOUNTER — Encounter: Payer: Medicare Other | Admitting: Infectious Disease

## 2019-02-05 ENCOUNTER — Ambulatory Visit (INDEPENDENT_AMBULATORY_CARE_PROVIDER_SITE_OTHER): Payer: Medicare Other | Admitting: Infectious Disease

## 2019-02-05 ENCOUNTER — Other Ambulatory Visit: Payer: Self-pay

## 2019-02-05 DIAGNOSIS — B20 Human immunodeficiency virus [HIV] disease: Secondary | ICD-10-CM

## 2019-02-05 DIAGNOSIS — Z79899 Other long term (current) drug therapy: Secondary | ICD-10-CM

## 2019-02-05 DIAGNOSIS — R1012 Left upper quadrant pain: Secondary | ICD-10-CM

## 2019-02-05 DIAGNOSIS — N816 Rectocele: Secondary | ICD-10-CM

## 2019-02-05 DIAGNOSIS — K5903 Drug induced constipation: Secondary | ICD-10-CM | POA: Diagnosis not present

## 2019-02-05 DIAGNOSIS — F1991 Other psychoactive substance use, unspecified, in remission: Secondary | ICD-10-CM

## 2019-02-05 DIAGNOSIS — Z87898 Personal history of other specified conditions: Secondary | ICD-10-CM

## 2019-02-05 DIAGNOSIS — R1011 Right upper quadrant pain: Secondary | ICD-10-CM

## 2019-02-05 DIAGNOSIS — F1111 Opioid abuse, in remission: Secondary | ICD-10-CM

## 2019-02-05 DIAGNOSIS — R569 Unspecified convulsions: Secondary | ICD-10-CM

## 2019-02-05 MED FILL — BIKTARVY 50-200-25 MG TABS: 50-200-25 | 30 days supply | Qty: 30 | Fill #3

## 2019-02-05 NOTE — Progress Notes (Signed)
Virtual Visit via Telephone Note  I connected with [name] on 02/05/19 at 11:30 AM EDT by telephone and verified that I am speaking with the correct person using two identifiers.  Location: Patient: Home Provider: Home   I discussed the limitations, risks, security and privacy concerns of performing an evaluation and management service by telephone and the availability of in person appointments. I also discussed with the patient that there may be a patient responsible charge related to this service. The patient expressed understanding and agreed to proceed.  Chief complaint: Abdominal pain and pressure in her right upper quadrant constipation and occasional vomiting  History of Present Illness:  [name] is a 38 year old Caucasian female living with HIV that is been perfectly controlled most recently on Biktarvy.  She is quite happy with how her HIV is being managed and she has been highly adherent to her medications.  Her medications are being shipped to her from [facility].  She is seen neurology for seizure disorder.  She has had significant problems with abdominal pain, especially the right upper quadrant but also characterized by problems with constipation and occasionally with vomiting.  She has been seen by gastroenterology and followed by primary care for this.  She still feels that that this is still not optimally managed due to her problems with vomiting at least on a weekly basis.  She did have a rectocele recently repaired by urology.  I think it might be reasonable to refer her to Dr. Matilde Bash in [facility] for her GI complaints.  Certainly many of them could be related to her problems with prior chronic heroin use and now being on opiate replacement therapy, her depressive symptoms as well.  But I feel this is probably the most optimal clinic to go to for a second opinion of this nature   Observations/Objective:  38 year old with HIV disease that is  well controlled with comorbid seizure disorder depression prior heroin abuse and problems with abdominal pain constipation and vomiting  Assessment and Plan:  HIV disease: Perfectly controlled and she can come back in 1 years time I will send a years worth of prescriptions to the pharmacy I am arranging for her to have a lab visit with me in September and visit with me in September 2021.  She does need flu shot  Need for pap smear: She underwent hysterectomy but still has her cervix and so therefore needs screening for cervical cancer.  She should be seen by Janene Madeira  Seizure disorder: Followed by neurology.  Possible IBS superimposed with problems with opiate-induced issues with her GI tract: Would recommend her seeing Dr. Matilde Bash or associate for 2nd opinion and optimization of her condition  History of IV drug abuse and on opiate replacement: Currently on opiate replacement    Follow Up Instructions:    I discussed the assessment and treatment plan with the patient. The patient was provided an opportunity to ask questions and all were answered. The patient agreed with the plan and demonstrated an understanding of the instructions.   The patient was advised to call back or seek an in-person evaluation if the symptoms worsen or if the condition fails to improve as anticipated.  I provided 21 minutes of non-face-to-face time during this encounter.   Alcide Evener, MD

## 2019-02-06 ENCOUNTER — Other Ambulatory Visit: Payer: Self-pay | Admitting: Neurology

## 2019-02-06 ENCOUNTER — Telehealth: Payer: Self-pay

## 2019-02-06 DIAGNOSIS — R569 Unspecified convulsions: Secondary | ICD-10-CM

## 2019-02-06 DIAGNOSIS — G43709 Chronic migraine without aura, not intractable, without status migrainosus: Secondary | ICD-10-CM

## 2019-02-06 DIAGNOSIS — IMO0002 Reserved for concepts with insufficient information to code with codable children: Secondary | ICD-10-CM

## 2019-02-06 DIAGNOSIS — R110 Nausea: Secondary | ICD-10-CM

## 2019-02-06 NOTE — Telephone Encounter (Signed)
Left patient voice mail to call back and schedule Pap, and flu shot visit with [employer]

## 2019-02-06 NOTE — Telephone Encounter (Signed)
-----   Message from Reggy Eye, [place] sent at 02/05/2019 11:43 AM EDT ----- Regarding: FW: needs pap smear w Donnal Moat and flu shot Please schedule pap with stephanie per Lucianne Lei dam ----- Message ----- From: Tommy Medal, Lavell Islam, MD Sent: 02/05/2019  11:36 AM EDT To: [facility] Triage Nurse Pool Subject: needs pap smear w Donnal Moat and flu shot

## 2019-02-11 ENCOUNTER — Other Ambulatory Visit: Payer: Medicare Other

## 2019-02-13 ENCOUNTER — Other Ambulatory Visit: Payer: Self-pay | Admitting: Neurology

## 2019-02-21 ENCOUNTER — Other Ambulatory Visit: Payer: Self-pay | Admitting: Neurology

## 2019-02-25 ENCOUNTER — Encounter: Payer: Self-pay | Admitting: Infectious Disease

## 2019-03-06 LAB — MYCOBACTERIA,CULT W/FLUOROCHROME SMEAR
MICRO NUMBER:: 805556
SMEAR:: NONE SEEN
SPECIMEN QUALITY:: ADEQUATE

## 2019-03-11 MED FILL — BIKTARVY 50-200-25 MG TABS: 50-200-25 | 30 days supply | Qty: 30 | Fill #4

## 2019-03-14 ENCOUNTER — Other Ambulatory Visit: Payer: Self-pay | Admitting: Neurology

## 2019-03-14 DIAGNOSIS — G43709 Chronic migraine without aura, not intractable, without status migrainosus: Secondary | ICD-10-CM

## 2019-03-14 DIAGNOSIS — Z79899 Other long term (current) drug therapy: Secondary | ICD-10-CM

## 2019-03-18 NOTE — Progress Notes (Signed)
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 discussed POTs which she brought up, I'm not sure these explain all her symptoms but this is not my area of expertise. Still is very curious, medication related? We screened her for porphyria in the past was neg. She has a large amount of stool in her colon, she has opioid indced constipation tried Movantik which caused side effects. Will try a different agent.    Discussed: To prevent or relieve headaches, try the following: Cool Compress. Lie down and place a cool compress on your head.  Avoid headache triggers. If certain foods or odors seem to have triggered your migraines in the past, avoid them. A headache diary might help you identify triggers.  Include physical activity in your daily routine. Try a daily walk or other moderate aerobic exercise.  Manage stress. Find healthy ways to cope with the stressors, such as delegating  tasks on your to-do list.  Practice relaxation techniques. Try deep breathing, yoga, massage and visualization.  Eat regularly. Eating regularly scheduled meals and maintaining a healthy diet might help prevent headaches. Also, drink plenty of fluids.  Follow a regular sleep schedule. Sleep deprivation might contribute to headaches Consider biofeedback. With this mind-body technique, you learn to control certain bodily functions -- such as muscle tension, heart rate and blood pressure -- to prevent headaches or reduce headache pain.    Proceed to emergency room if you experience new or worsening symptoms or symptoms do not resolve, if you have new neurologic symptoms or if headache is severe, or for any concerning symptom.   Provided education and documentation from American headache Society toolbox including articles on: chronic migraine medication overuse headache, chronic migraines, prevention of migraines, behavioral and other nonpharmacologic treatments for headache.    Naomie Dean, MD  [facility] [address] [place], [place] [address]  Phone [phone] Fax [phone]  A total of 25  minutes was spent face-to-face with this patient. Over half this time was spent on counseling patient on the  1. Chronic migraine without aura without status migrainosus, not intractable   2. Nausea without vomiting   3. Partial seizures (HCC)   4. Chronic migraine  diagnosis and different diagnostic and therapeutic options, counseling and coordination of care, risks ans benefits of management, compliance, or risk factor reduction and education.

## 2019-03-19 ENCOUNTER — Other Ambulatory Visit: Payer: Self-pay

## 2019-03-19 ENCOUNTER — Encounter: Payer: Self-pay | Admitting: Neurology

## 2019-03-19 ENCOUNTER — Ambulatory Visit (INDEPENDENT_AMBULATORY_CARE_PROVIDER_SITE_OTHER): Payer: Medicare Other | Admitting: Neurology

## 2019-03-19 DIAGNOSIS — R110 Nausea: Secondary | ICD-10-CM | POA: Diagnosis not present

## 2019-03-19 DIAGNOSIS — G43709 Chronic migraine without aura, not intractable, without status migrainosus: Secondary | ICD-10-CM

## 2019-03-19 DIAGNOSIS — IMO0002 Reserved for concepts with insufficient information to code with codable children: Secondary | ICD-10-CM

## 2019-03-19 DIAGNOSIS — R569 Unspecified convulsions: Secondary | ICD-10-CM

## 2019-03-19 MED ORDER — AIMOVIG 140 MG/ML ~~LOC~~ SOAJ: 140.0000 mg | SUBCUTANEOUS | 11 refills | Status: DC

## 2019-03-19 MED ORDER — LAMOTRIGINE 25 MG PO TABS: ORAL_TABLET | ORAL | 6 refills | Status: DC

## 2019-03-19 MED ORDER — RIZATRIPTAN BENZOATE 10 MG PO TABS: 10.0000 mg | ORAL_TABLET | ORAL | 11 refills | Status: DC | PRN

## 2019-03-19 MED ORDER — ONDANSETRON 4 MG PO TBDP: 4.0000 mg | ORAL_TABLET | Freq: Three times a day (TID) | ORAL | 11 refills | Status: DC | PRN

## 2019-03-19 MED ORDER — GABAPENTIN 300 MG PO CAPS: ORAL_CAPSULE | ORAL | 4 refills | Status: DC

## 2019-03-19 MED ORDER — BACLOFEN 10 MG PO TABS: 10.0000 mg | ORAL_TABLET | Freq: Three times a day (TID) | ORAL | 11 refills | Status: DC

## 2019-03-19 MED ORDER — LAMOTRIGINE 200 MG PO TABS: 200.0000 mg | ORAL_TABLET | Freq: Two times a day (BID) | ORAL | 3 refills | Status: DC

## 2019-03-28 ENCOUNTER — Encounter: Payer: Self-pay | Admitting: Internal Medicine

## 2019-04-08 MED FILL — BIKTARVY 50-200-25 MG TABS: 50-200-25 | 30 days supply | Qty: 30 | Fill #5

## 2019-05-06 MED FILL — BIKTARVY 50-200-25 MG TABS: 50-200-25 | 30 days supply | Qty: 30 | Fill #6

## 2019-05-14 ENCOUNTER — Ambulatory Visit (INDEPENDENT_AMBULATORY_CARE_PROVIDER_SITE_OTHER): Payer: Medicare Other | Admitting: Internal Medicine

## 2019-05-14 ENCOUNTER — Encounter: Payer: Self-pay | Admitting: Internal Medicine

## 2019-05-14 VITALS — BP 124/64 | HR 77 | Temp 98.7°F | Ht 66.0 in | Wt 109.2 lb

## 2019-05-14 DIAGNOSIS — R1012 Left upper quadrant pain: Secondary | ICD-10-CM

## 2019-05-14 DIAGNOSIS — R1319 Other dysphagia: Secondary | ICD-10-CM

## 2019-05-14 DIAGNOSIS — G8929 Other chronic pain: Secondary | ICD-10-CM | POA: Diagnosis not present

## 2019-05-14 DIAGNOSIS — K5909 Other constipation: Secondary | ICD-10-CM | POA: Diagnosis not present

## 2019-05-14 DIAGNOSIS — K921 Melena: Secondary | ICD-10-CM

## 2019-05-14 DIAGNOSIS — R1310 Dysphagia, unspecified: Secondary | ICD-10-CM | POA: Diagnosis not present

## 2019-05-14 DIAGNOSIS — Z1159 Encounter for screening for other viral diseases: Secondary | ICD-10-CM

## 2019-05-14 MED ORDER — LINACLOTIDE 290 MCG PO CAPS: 290.0000 ug | ORAL_CAPSULE | Freq: Every day | ORAL | 0 refills | Status: DC
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
## 2019-05-14 NOTE — Patient Instructions (Signed)
You have been scheduled for an endoscopy and colonoscopy. Please follow the written instructions given to you at your visit today. Please pick up your prep supplies at the pharmacy within the next 1-3 days. If you use inhalers (even only as needed), please bring them with you on the day of your procedure.   Do 1/2 dose of the Plenvu on 05/20/2019 at 2:00pm and then call and let us know if this gets some results.   We are giving you samples of Linzess to try, one a day before breakfast.    I appreciate the opportunity to care for you. Silvano Rusk, MD, [facility]

## 2019-05-20 ENCOUNTER — Ambulatory Visit (INDEPENDENT_AMBULATORY_CARE_PROVIDER_SITE_OTHER): Payer: Medicare Other

## 2019-05-20 DIAGNOSIS — Z1159 Encounter for screening for other viral diseases: Secondary | ICD-10-CM

## 2019-05-21 LAB — SARS CORONAVIRUS 2 (TAT 6-24 HRS): SARS Coronavirus 2: NEGATIVE

## 2019-05-22 ENCOUNTER — Other Ambulatory Visit: Payer: Self-pay

## 2019-05-22 ENCOUNTER — Ambulatory Visit (AMBULATORY_SURGERY_CENTER): Payer: Medicare Other | Admitting: Internal Medicine

## 2019-05-22 ENCOUNTER — Encounter: Payer: Self-pay | Admitting: Internal Medicine

## 2019-05-22 VITALS — BP 112/74 | HR 65 | Temp 99.3°F | Resp 10 | Ht 66.0 in | Wt 109.0 lb

## 2019-05-22 DIAGNOSIS — K921 Melena: Secondary | ICD-10-CM

## 2019-05-22 DIAGNOSIS — K648 Other hemorrhoids: Secondary | ICD-10-CM

## 2019-05-22 DIAGNOSIS — R1319 Other dysphagia: Secondary | ICD-10-CM

## 2019-05-22 DIAGNOSIS — R1012 Left upper quadrant pain: Secondary | ICD-10-CM | POA: Diagnosis not present

## 2019-05-22 MED ORDER — SODIUM CHLORIDE 0.9 % IV SOLN: 500.0000 mL | Freq: Once | INTRAVENOUS | Status: DC

## 2019-05-22 NOTE — Progress Notes (Signed)
Pt's states no medical or surgical changes since previsit or office visit.  Temp jb Vitals cw

## 2019-05-22 NOTE — Patient Instructions (Addendum)
These exams looked ok.  I think the bleeding was from hemorrhoids.  I took some upper intestinal biopsies to check for gluten allergy.  I stretched or dilated the esophagus to see if that helps you swallow better. subutex could be contributing to swallowing problems and constipation  Next step is an MRI to examine the pelvis and defecation function. My office staff will set that up.  I appreciate the opportunity to care for you. Iva Boop, MD, FACG  YOU HAD AN ENDOSCOPIC PROCEDURE TODAY AT THE  ENDOSCOPY CENTER:   Refer to the procedure report that was given to you for any specific questions about what was found during the examination.  If the procedure report does not answer your questions, please call your gastroenterologist to clarify.  If you requested that your care partner not be given the details of your procedure findings, then the procedure report has been included in a sealed envelope for you to review at your convenience later.  YOU SHOULD EXPECT: Some feelings of bloating in the abdomen. Passage of more gas than usual.  Walking can help get rid of the air that was put into your GI tract during the procedure and reduce the bloating. If you had a lower endoscopy (such as a colonoscopy or flexible sigmoidoscopy) you may notice spotting of blood in your stool or on the toilet paper. If you underwent a bowel prep for your procedure, you may not have a normal bowel movement for a few days.  Please Note:  You might notice some irritation and congestion in your nose or some drainage.  This is from the oxygen used during your procedure.  There is no need for concern and it should clear up in a day or so.  SYMPTOMS TO REPORT IMMEDIATELY:   Following lower endoscopy (colonoscopy or flexible sigmoidoscopy):  Excessive amounts of blood in the stool  Significant tenderness or worsening of abdominal pains  Swelling of the abdomen that is new, acute  Fever of 100F or  higher   Following upper endoscopy (EGD)  Vomiting of blood or coffee ground material  New chest pain or pain under the shoulder blades  Painful or persistently difficult swallowing  New shortness of breath  Fever of 100F or higher  Black, tarry-looking stools  For urgent or emergent issues, a gastroenterologist can be reached at any hour by calling [phone].   DIET:  See dilation diet!!!  No alcohol for 24 hours  ACTIVITY:  You should plan to take it easy for the rest of today and you should NOT DRIVE or use heavy machinery until tomorrow (because of the sedation medicines used during the test).    FOLLOW UP: Our staff will call the number listed on your records 48-72 hours following your procedure to check on you and address any questions or concerns that you may have regarding the information given to you following your procedure. If we do not reach you, we will leave a message.  We will attempt to reach you two times.  During this call, we will ask if you have developed any symptoms of COVID 19. If you develop any symptoms (ie: fever, flu-like symptoms, shortness of breath, cough etc.) before then, please call [phone].  If you test positive for Covid 19 in the 2 weeks post procedure, please call and report this information to [place].    If any biopsies were taken you will be contacted by phone or by letter within the next 1-3 weeks.  Please call us at [phone] if you have not heard about the biopsies in 3 weeks.    SIGNATURES/CONFIDENTIALITY: You and/or your care partner have signed paperwork which will be entered into your electronic medical record.  These signatures attest to the fact that that the information above on your After Visit Summary has been reviewed and is understood.  Full responsibility of the confidentiality of this discharge information lies with you and/or your care-partner.  Await pathology  Follow dilation diet- clear fluids until 3:15 p.m. (1  hour) and then soft food the rest of today  Stop Linzess, continue other medications  You took your dose of Subutex at 2:15 p.m.

## 2019-05-22 NOTE — Progress Notes (Signed)
Report to PACU, RN, vss, BBS= Clear.  

## 2019-05-22 NOTE — Op Note (Signed)
[place] Patient Name: [name] Procedure Date: 05/22/2019 1:16 PM MRN: [id number] Endoscopist: Gatha Mayer , MD Age: 39 Referring MD:  Date of Birth: 09-18-1980 Gender: Female Account #: [id number] Procedure:                Colonoscopy Indications:              Rectal bleeding Medicines:                Propofol per Anesthesia, Monitored Anesthesia Care Procedure:                Pre-Anesthesia Assessment:                           - Prior to the procedure, a History and Physical                            was performed, and patient medications and                            allergies were reviewed. The patient's tolerance of                            previous anesthesia was also reviewed. The risks                            and benefits of the procedure and the sedation                            options and risks were discussed with the patient.                            All questions were answered, and informed consent                            was obtained. Prior Anticoagulants: The patient has                            taken no previous anticoagulant or antiplatelet                            agents. ASA Grade Assessment: II - A patient with                            mild systemic disease. After reviewing the risks                            and benefits, the patient was deemed in                            satisfactory condition to undergo the procedure.                           After obtaining informed consent, the colonoscope  was passed under direct vision. Throughout the                            procedure, the patient's blood pressure, pulse, and                            oxygen saturations were monitored continuously. The                            Colonoscope was introduced through the anus and                            advanced to the the terminal ileum, with                            identification of the  appendiceal orifice and IC                            valve. The colonoscopy was performed without                            difficulty. The patient tolerated the procedure                            well. The quality of the bowel preparation was                            adequate. The terminal ileum, ileocecal valve,                            appendiceal orifice, and rectum were photographed.                            The bowel preparation used was Miralax and 1/2 a                            Plenvu kit via split dose instruction. Scope In: 1:34:25 PM Scope Out: 1:49:59 PM Scope Withdrawal Time: 0 hours 11 minutes 44 seconds  Total Procedure Duration: 0 hours 15 minutes 34 seconds  Findings:                 The perianal and digital rectal examinations were                            normal.                           The terminal ileum appeared normal.                           Internal hemorrhoids were found.                           The exam was otherwise without abnormality on  direct and retroflexion views. Complications:            No immediate complications. Estimated Blood Loss:     Estimated blood loss: none. Impression:               - The examined portion of the ileum was normal.                           - Internal hemorrhoids.                           - The examination was otherwise normal on direct                            and retroflexion views.                           - No specimens collected. Recommendation:           - Patient has a contact number available for                            emergencies. The signs and symptoms of potential                            delayed complications were discussed with the                            patient. Return to normal activities tomorrow.                            Written discharge instructions were provided to the                            patient.                           - Clear liquids  x 1 hour then soft foods rest of                            day. Start prior diet tomorrow.                           - Continue present medications.                           - DC Linzess "did not work, no BM, I felt                            constipated"                           SCHEDULE MR DEFECOGRAPHY - chronic constipation,                            s/p rectocele repair and perinerrhophy  order MR Pelvis attention Dr. Kris Hartmann for MR                            defecography Gatha Mayer, MD 05/22/2019 2:06:12 PM This report has been signed electronically.

## 2019-05-22 NOTE — Progress Notes (Signed)
Pt to RR at 1355, writhing in the bed, moaning, cussing, stating, "my stomach hurts like hell." Abdomen soft but she states it hurts to touch.  Dr. Carlean Purl at the bedside; he questions her regarding when her Subutex was taken last.  She states she took it in the morning, and was due at lunchtime. Rates abdominal pain as "10." 1358 Levsin 0.125 mg SL given per DO 1408- Pt is resting with eyes closed at this time, no distress noted 1415- pt takes 1 pill of her own supply of Subutex- Dr. Carlean Purl is aware before given.  This pill is taken SL.  Her abdomen is soft, and she states, "it hurts but is a little better." 1420- resting quietly, rating abdominal discomfort as a "6-7."  Abdomen soft. 1426- rates abdominal pain as a "4-5" now.  Abdomen still soft  1442- Fentanyl 25 mcg IV given per DO  1452- Fentanyl 25 mcg IV giver per DO

## 2019-05-22 NOTE — Op Note (Signed)
[facility] Patient Name: Sally SarksJennifer Rowe Procedure Date: 05/22/2019 1:15 PM MRN: [id number] Endoscopist: Iva Booparl E Selita Staiger , MD Age: 38 Referring MD:  Date of Birth: 1981/04/16 Gender: Female Account #: [id number] Procedure:                Upper GI endoscopy Indications:              Abdominal pain in the left upper quadrant, Dysphagia Medicines:                Propofol per Anesthesia, Monitored Anesthesia Care Procedure:                Pre-Anesthesia Assessment:                           - Prior to the procedure, a History and Physical                            was performed, and patient medications and                            allergies were reviewed. The patient's tolerance of                            previous anesthesia was also reviewed. The risks                            and benefits of the procedure and the sedation                            options and risks were discussed with the patient.                            All questions were answered, and informed consent                            was obtained. Prior Anticoagulants: The patient has                            taken no previous anticoagulant or antiplatelet                            agents. ASA Grade Assessment: II - A patient with                            mild systemic disease. After reviewing the risks                            and benefits, the patient was deemed in                            satisfactory condition to undergo the procedure.                           After obtaining informed consent, the endoscope was  passed under direct vision. Throughout the                            procedure, the patient's blood pressure, pulse, and                            oxygen saturations were monitored continuously. The                            Endoscope was introduced through the mouth, and                            advanced to the second part of duodenum. The upper                            GI endoscopy was accomplished without difficulty.                            The patient tolerated the procedure well. Scope In: Scope Out: Findings:                 The examined esophagus was normal. The scope was                            withdrawn. Dilation was performed with a Maloney                            dilator with no resistance at 61 Fr. The dilation                            site was examined following endoscope reinsertion                            and showed no change. Estimated blood loss: none.                           The exam was otherwise without abnormality.                           The cardia and gastric fundus were normal on                            retroflexion.                           Biopsies for histology were taken with a cold                            forceps in the entire duodenum for evaluation of                            celiac disease. Verification of patient                            identification for  the specimen was done. Estimated                            blood loss was minimal. Complications:            No immediate complications. Estimated Blood Loss:     Estimated blood loss was minimal. Impression:               - Normal esophagus. Dilated.                           - The examination was otherwise normal.                           - Biopsies were taken with a cold forceps for                            evaluation of celiac disease. Recommendation:           - Patient has a contact number available for                            emergencies. The signs and symptoms of potential                            delayed complications were discussed with the                            patient. Return to normal activities tomorrow.                            Written discharge instructions were provided to the                            patient.                           - Clear liquids x 1 hour then soft foods rest  of                            day. Start prior diet tomorrow.                           - Await pathology results.                           - See the other procedure note for documentation of                            additional recommendations. Iva Boop, MD 05/22/2019 2:00:03 PM This report has been signed electronically.

## 2019-05-27 ENCOUNTER — Telehealth: Payer: Self-pay

## 2019-05-27 ENCOUNTER — Telehealth: Payer: Self-pay | Admitting: *Deleted

## 2019-05-27 DIAGNOSIS — K5909 Other constipation: Secondary | ICD-10-CM

## 2019-05-27 NOTE — Telephone Encounter (Signed)
1. Have you developed a fever since your procedure? no  2.   Have you had an respiratory symptoms (SOB or cough) since your procedure? no  3.   Have you tested positive for COVID 19 since your procedure no  4.   Have you had any family members/close contacts diagnosed with the COVID 19 since your procedure?  no   If yes to any of these questions please route to Joylene John, RN and Alphonsa Gin, Therapist, sports.  Follow up Call-  Call back number 05/22/2019  Post procedure Call Back phone  # [phone]  Some recent data might be hidden     Patient questions:  Do you have a fever, pain , or abdominal swelling? No. Pain Score  0 *  Have you tolerated food without any problems? Yes.    Have you been able to return to your normal activities? Yes.    Do you have any questions about your discharge instructions: Diet   No. Medications  No. Follow up visit  No.  Do you have questions or concerns about your Care? No.  Actions: * If pain score is 4 or above: No action needed, pain <4.

## 2019-05-27 NOTE — Telephone Encounter (Signed)
Patient has been notified that she is scheduled for MR defecography at [facility] on 06/07/2019 8:30 arrival .  She is asked to be NPO for 4 hours prior

## 2019-06-03 MED FILL — BIKTARVY 50-200-25 MG TABS: 50-200-25 | 30 days supply | Qty: 30 | Fill #7

## 2019-06-05 ENCOUNTER — Telehealth: Payer: Self-pay

## 2019-06-05 ENCOUNTER — Other Ambulatory Visit: Payer: Self-pay

## 2019-06-05 DIAGNOSIS — K5909 Other constipation: Secondary | ICD-10-CM

## 2019-06-05 NOTE — Telephone Encounter (Signed)
I called to inform the patient that I needed to schedule the the MRI defecography to [facility], and advised her that I scheduled it at a facility that did not perform.  I apologized for the inconvenience and let her know that [facility] will contact her directly with an appt date and time.  She verbalized understanding.

## 2019-06-07 ENCOUNTER — Ambulatory Visit (HOSPITAL_COMMUNITY): Payer: Medicare Other

## 2019-06-07 ENCOUNTER — Ambulatory Visit (HOSPITAL_COMMUNITY): Admission: RE | Admit: 2019-06-07 | Payer: Medicare Other | Source: Ambulatory Visit

## 2019-06-12 MED FILL — BIKTARVY 50-200-25 MG TABS: 50-200-25 | 30 days supply | Qty: 30 | Fill #7

## 2019-06-14 ENCOUNTER — Ambulatory Visit
Admission: RE | Admit: 2019-06-14 | Discharge: 2019-06-14 | Disposition: A | Discharge status: Home / Self Care | Payer: Medicare Other | Source: Ambulatory Visit | Attending: Internal Medicine | Admitting: Internal Medicine

## 2019-06-14 ENCOUNTER — Other Ambulatory Visit: Payer: Self-pay

## 2019-06-14 DIAGNOSIS — K5909 Other constipation: Secondary | ICD-10-CM

## 2019-06-19 ENCOUNTER — Other Ambulatory Visit: Payer: Self-pay | Admitting: *Deleted

## 2019-06-19 DIAGNOSIS — N8189 Other female genital prolapse: Secondary | ICD-10-CM

## 2019-07-11 MED FILL — BIKTARVY 50-200-25 MG TABS: 50-200-25 | 30 days supply | Qty: 30 | Fill #8

## 2019-07-22 ENCOUNTER — Ambulatory Visit (INDEPENDENT_AMBULATORY_CARE_PROVIDER_SITE_OTHER): Payer: Medicare Other | Admitting: Neurology

## 2019-07-22 ENCOUNTER — Encounter: Payer: Self-pay | Admitting: Neurology

## 2019-07-22 ENCOUNTER — Other Ambulatory Visit: Payer: Self-pay

## 2019-07-22 VITALS — BP 126/78 | HR 80 | Temp 97.7°F | Ht 66.0 in | Wt 112.0 lb

## 2019-07-22 DIAGNOSIS — H93A9 Pulsatile tinnitus, unspecified ear: Secondary | ICD-10-CM | POA: Diagnosis not present

## 2019-07-22 DIAGNOSIS — R51 Headache with orthostatic component, not elsewhere classified: Secondary | ICD-10-CM | POA: Diagnosis not present

## 2019-07-22 DIAGNOSIS — R5383 Other fatigue: Secondary | ICD-10-CM | POA: Diagnosis not present

## 2019-07-22 MED ORDER — NURTEC 75 MG PO TBDP: 75.0000 mg | ORAL_TABLET | Freq: Every day | ORAL | 0 refills | Status: DC | PRN

## 2019-07-22 MED ORDER — METHYLPREDNISOLONE 4 MG PO TBPK: ORAL_TABLET | ORAL | 1 refills | Status: DC

## 2019-07-22 MED ORDER — ONDANSETRON 4 MG PO TBDP: 4.0000 mg | ORAL_TABLET | Freq: Three times a day (TID) | ORAL | 11 refills | Status: DC | PRN

## 2019-07-22 NOTE — Patient Instructions (Signed)
Nurtec: Daily as needed for headaches CT of the head and blood vessels Blood work today Medrol dosepak  Methylprednisolone tablets What is this medicine? METHYLPREDNISOLONE (meth ill pred NISS oh lone) is a corticosteroid. It is commonly used to treat inflammation of the skin, joints, lungs, and other organs. Common conditions treated include asthma, allergies, and arthritis. It is also used for other conditions, such as blood disorders and diseases of the adrenal glands. This medicine may be used for other purposes; ask your health care provider or pharmacist if you have questions. COMMON BRAND NAME(S): Medrol, Medrol Dosepak What should I tell my health care provider before I take this medicine? They need to know if you have any of these conditions:  Cushing's syndrome  eye disease, vision problems  diabetes  glaucoma  heart disease  high blood pressure  infection (especially a virus infection such as chickenpox, cold sores, or herpes)  liver disease  mental illness  myasthenia gravis  osteoporosis  recently received or scheduled to receive a vaccine  seizures  stomach or intestine problems  thyroid disease  an unusual or allergic reaction to lactose, methylprednisolone, other medicines, foods, dyes, or preservatives  pregnant or trying to get pregnant  breast-feeding How should I use this medicine? Take this medicine by mouth with a glass of water. Follow the directions on the prescription label. Take this medicine with food. If you are taking this medicine once a day, take it in the morning. Do not take it more often than directed. Do not suddenly stop taking your medicine because you may develop a severe reaction. Your doctor will tell you how much medicine to take. If your doctor wants you to stop the medicine, the dose may be slowly lowered over time to avoid any side effects. Talk to your pediatrician regarding the use of this medicine in children. Special  care may be needed. Overdosage: If you think you have taken too much of this medicine contact a poison control center or emergency room at once. NOTE: This medicine is only for you. Do not share this medicine with others. What if I miss a dose? If you miss a dose, take it as soon as you can. If it is almost time for your next dose, talk to your doctor or health care professional. You may need to miss a dose or take an extra dose. Do not take double or extra doses without advice. What may interact with this medicine? Do not take this medicine with any of the following medications:  alefacept  echinacea  live virus vaccines  metyrapone  mifepristone This medicine may also interact with the following medications:  amphotericin B  aspirin and aspirin-like medicines  certain antibiotics like erythromycin, clarithromycin, troleandomycin  certain medicines for diabetes  certain medicines for fungal infections like ketoconazole  certain medicines for seizures like carbamazepine, phenobarbital, phenytoin  certain medicines that treat or prevent blood clots like warfarin  cholestyramine  cyclosporine  digoxin  diuretics  female hormones, like estrogens and birth control pills  isoniazid  NSAIDs, medicines for pain inflammation, like ibuprofen or naproxen  other medicines for myasthenia gravis  rifampin  vaccines This list may not describe all possible interactions. Give your health care provider a list of all the medicines, herbs, non-prescription drugs, or dietary supplements you use. Also tell them if you smoke, drink alcohol, or use illegal drugs. Some items may interact with your medicine. What should I watch for while using this medicine? Tell your doctor or  healthcare professional if your symptoms do not start to get better or if they get worse. Do not stop taking except on your doctor's advice. You may develop a severe reaction. Your doctor will tell you how much  medicine to take. This medicine may increase your risk of getting an infection. Tell your doctor or health care professional if you are around anyone with measles or chickenpox, or if you develop sores or blisters that do not heal properly. This medicine may increase blood sugar levels. Ask your healthcare provider if changes in diet or medicines are needed if you have diabetes. Tell your doctor or health care professional right away if you have any change in your eyesight. Using this medicine for a long time may increase your risk of low bone mass. Talk to your doctor about bone health. What side effects may I notice from receiving this medicine? Side effects that you should report to your doctor or health care professional as soon as possible:  allergic reactions like skin rash, itching or hives, swelling of the face, lips, or tongue  bloody or tarry stools  hallucination, loss of contact with reality  muscle cramps  muscle pain  palpitations  signs and symptoms of high blood sugar such as being more thirsty or hungry or having to urinate more than normal. You may also feel very tired or have blurry vision.  signs and symptoms of infection like fever or chills; cough; sore throat; pain or trouble passing urine Side effects that usually do not require medical attention (report to your doctor or health care professional if they continue or are bothersome):  changes in emotions or mood  constipation  diarrhea  excessive hair growth on the face or body  headache  nausea, vomiting  trouble sleeping  weight gain This list may not describe all possible side effects. Call your doctor for medical advice about side effects. You may report side effects to FDA at 1-800-FDA-1088. Where should I keep my medicine? Keep out of the reach of children. Store at room temperature between 20 and 25 degrees C (68 and 77 degrees F). Throw away any unused medicine after the expiration  date. NOTE: This sheet is a summary. It may not cover all possible information. If you have questions about this medicine, talk to your doctor, pharmacist, or health care provider.  2020 [employer] (2018-02-15 09:19:36) Rimegepant oral dissolving tablet What is this medicine? RIMEGEPANT (ri ME je pant) is used to treat migraine headaches with or without aura. An aura is a strange feeling or visual disturbance that warns you of an attack. It is not used to prevent migraines. This medicine may be used for other purposes; ask your health care provider or pharmacist if you have questions. COMMON BRAND NAME(S): NURTEC ODT What should I tell my health care provider before I take this medicine? They need to know if you have any of these conditions:  kidney disease  liver disease  an unusual or allergic reaction to rimegepant, other medicines, foods, dyes, or preservatives  pregnant or trying to get pregnant  breast-feeding How should I use this medicine? Take the medicine by mouth. Follow the directions on the prescription label. Leave the tablet in the sealed blister pack until you are ready to take it. With dry hands, open the blister and gently remove the tablet. If the tablet breaks or crumbles, throw it away and take a new tablet out of the blister pack. Place the tablet in the mouth and  allow it to dissolve, and then swallow. Do not cut, crush, or chew this medicine. You do not need water to take this medicine. Talk to your pediatrician about the use of this medicine in children. Special care may be needed. Overdosage: If you think you have taken too much of this medicine contact a poison control center or emergency room at once. NOTE: This medicine is only for you. Do not share this medicine with others. What if I miss a dose? This does not apply. This medicine is not for regular use. What may interact with this medicine? This medicine may interact with the following  medications:  certain medicines for fungal infections like fluconazole, itraconazole  rifampin This list may not describe all possible interactions. Give your health care provider a list of all the medicines, herbs, non-prescription drugs, or dietary supplements you use. Also tell them if you smoke, drink alcohol, or use illegal drugs. Some items may interact with your medicine. What should I watch for while using this medicine? Visit your health care professional for regular checks on your progress. Tell your health care professional if your symptoms do not start to get better or if they get worse. What side effects may I notice from receiving this medicine? Side effects that you should report to your doctor or health care professional as soon as possible:  allergic reactions like skin rash, itching or hives; swelling of the face, lips, or tongue Side effects that usually do not require medical attention (report these to your doctor or health care professional if they continue or are bothersome):  nausea This list may not describe all possible side effects. Call your doctor for medical advice about side effects. You may report side effects to FDA at 1-800-FDA-1088. Where should I keep my medicine? Keep out of the reach of children. Store at room temperature between 15 and 30 degrees C (59 and 86 degrees F). Throw away any unused medicine after the expiration date. NOTE: This sheet is a summary. It may not cover all possible information. If you have questions about this medicine, talk to your doctor, pharmacist, or health care provider.  2020 [employer] Standard (2018-07-30 00:21:31)

## 2019-07-22 NOTE — Progress Notes (Signed)
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 Comprehensive metabolic panel  . SAR CoV2 Serology (COVID 19)AB(IGG)IA     Meds ordered this encounter  Medications  . methylPREDNISolone (MEDROL DOSEPAK) 4 MG TBPK tablet    Sig: Take daily with food in the morning. 6-5-4-3-2-1    Dispense:  21 tablet    Refill:  1  . ondansetron (ZOFRAN ODT) 4 MG disintegrating tablet    Sig: Take 1-2 tablets (4-8 mg total) by mouth every 8 (eight) hours as needed for nausea or vomiting.    Dispense:  60 tablet    Refill:  11  . Rimegepant Sulfate (NURTEC) 75 MG TBDP    Sig: Take 75 mg by mouth daily as needed. For migraines. Take as close to onset of migraine as possible. One daily maximum.    Dispense:  6 tablet    Refill:  0    Seizures: continue Lamictal which is also helpful for her mood disorder. Doing well.stable.  Trigeminal neuralgia: Lamictal helps, neurontin or baclofen prn, doing well, stable Meralgia Paresthetica: monitor clincally, stable no changes Migraines: triptan and anti-emetic acutely, On aimovig significantly  improved. Continue. gabapentin, lamictal, baclofen, zofrn and riztriptan.  Unclear nausea/vomiting and weight loss she is following with multiple specialists, discussed mesenteric insufficiency, also discussed POTs which she brought up, I'm not sure these explain all her symptoms but this is not my area of expertise. Still is very curious, medication related? We screened her for porphyria in the past was neg. She has a large amount of stool in her colon, she has opioid indced constipation tried Movantik which caused side effects. WF/u with GI and Porfirio Oar   Discussed: To prevent or relieve headaches, try the following: Cool Compress. Lie down and place a cool compress on your head.  Avoid headache triggers. If certain foods or odors seem to have triggered your migraines in the past, avoid them. A headache diary might help you identify triggers.  Include physical activity in your daily routine. Try a daily walk or other moderate aerobic exercise.  Manage stress. Find healthy ways to cope with the stressors, such as delegating tasks on your to-do list.  Practice relaxation techniques. Try deep breathing, yoga, massage and visualization.  Eat regularly. Eating regularly scheduled meals and maintaining a healthy diet might help prevent headaches. Also, drink plenty of fluids.  Follow a regular sleep schedule. Sleep deprivation might contribute to headaches Consider biofeedback. With this mind-body technique, you learn to control certain bodily functions -- such as muscle tension, heart rate and blood pressure -- to prevent headaches or reduce headache pain.    Proceed to emergency room if you experience new or worsening symptoms or symptoms do not resolve, if you have new neurologic symptoms or if headache is  severe, or for any concerning symptom.   Provided education and documentation from American headache Society toolbox including articles on: chronic migraine medication overuse headache,  chronic migraines, prevention of migraines, behavioral and other nonpharmacologic treatments for headache.    Naomie DeanAntonia Cerena Baine, MD  [facility] [address] [place], [place] [id number]  Phone [phone] Fax [phone]  A total of 45 minutes was spent face-to-face with this patient. Over half this time was spent on counseling patient on the  1. Other fatigue   2. Positional headache   3. Pulsatile tinnitus    diagnosis and different diagnostic and therapeutic options, counseling and coordination of care, risks ans benefits of management, compliance, or risk factor reduction and education.

## 2019-07-23 ENCOUNTER — Emergency Department (HOSPITAL_COMMUNITY)
Admission: EM | Admit: 2019-07-23 | Discharge: 2019-07-23 | Disposition: A | Discharge status: Home / Self Care | Payer: Medicare Other | Attending: Emergency Medicine | Admitting: Emergency Medicine

## 2019-07-23 ENCOUNTER — Telehealth: Payer: Self-pay | Admitting: Neurology

## 2019-07-23 ENCOUNTER — Encounter (HOSPITAL_COMMUNITY): Payer: Self-pay | Admitting: Emergency Medicine

## 2019-07-23 ENCOUNTER — Other Ambulatory Visit: Payer: Self-pay

## 2019-07-23 DIAGNOSIS — J449 Chronic obstructive pulmonary disease, unspecified: Secondary | ICD-10-CM | POA: Insufficient documentation

## 2019-07-23 DIAGNOSIS — Z21 Asymptomatic human immunodeficiency virus [HIV] infection status: Secondary | ICD-10-CM | POA: Insufficient documentation

## 2019-07-23 DIAGNOSIS — Z79899 Other long term (current) drug therapy: Secondary | ICD-10-CM | POA: Insufficient documentation

## 2019-07-23 DIAGNOSIS — R899 Unspecified abnormal finding in specimens from other organs, systems and tissues: Secondary | ICD-10-CM | POA: Insufficient documentation

## 2019-07-23 DIAGNOSIS — Z87891 Personal history of nicotine dependence: Secondary | ICD-10-CM | POA: Diagnosis not present

## 2019-07-23 DIAGNOSIS — E875 Hyperkalemia: Secondary | ICD-10-CM | POA: Diagnosis present

## 2019-07-23 LAB — CBC
Hematocrit: 37.4 % (ref 34.0–46.6)
Hemoglobin: 12.5 g/dL (ref 11.1–15.9)
MCH: 31.4 pg (ref 26.6–33.0)
MCHC: 33.4 g/dL (ref 31.5–35.7)
MCV: 94 fL (ref 79–97)
Platelets: 234 10*3/uL (ref 150–450)
RBC: 3.98 x10E6/uL (ref 3.77–5.28)
RDW: 11.8 % (ref 11.7–15.4)
WBC: 5.3 10*3/uL (ref 3.4–10.8)

## 2019-07-23 LAB — COMPREHENSIVE METABOLIC PANEL
ALT: 9 IU/L (ref 0–32)
ALT: 13 U/L (ref 0–44)
AST: 13 IU/L (ref 0–40)
AST: 19 U/L (ref 15–41)
Albumin/Globulin Ratio: 1.7 (ref 1.2–2.2)
Albumin: 4.2 g/dL (ref 3.8–4.8)
Albumin: 3.9 g/dL (ref 3.5–5.0)
Alkaline Phosphatase: 84 IU/L (ref 39–117)
Alkaline Phosphatase: 77 U/L (ref 38–126)
Anion gap: 9 (ref 5–15)
BUN/Creatinine Ratio: 11 (ref 9–23)
BUN: 9 mg/dL (ref 6–20)
BUN: 10 mg/dL (ref 6–20)
Bilirubin Total: <0.2 mg/dL (ref 0.0–1.2)
CO2: 27 mmol/L (ref 20–29)
CO2: 28 mmol/L (ref 22–32)
Calcium: 9.4 mg/dL (ref 8.7–10.2)
Calcium: 8.9 mg/dL (ref 8.9–10.3)
Chloride: 104 mmol/L (ref 96–106)
Chloride: 102 mmol/L (ref 98–111)
Creatinine, Ser: 0.85 mg/dL (ref 0.57–1.00)
Creatinine, Ser: 0.92 mg/dL (ref 0.44–1.00)
GFR calc Af Amer: 100 mL/min/{1.73_m2} (ref >59)
GFR calc Af Amer: >60 mL/min (ref >60)
GFR calc non Af Amer: 87 mL/min/{1.73_m2} (ref >59)
GFR calc non Af Amer: >60 mL/min (ref >60)
Globulin, Total: 2.5 g/dL (ref 1.5–4.5)
Glucose, Bld: 188 mg/dL — ABNORMAL HIGH (ref 70–99)
Glucose: 101 mg/dL — ABNORMAL HIGH (ref 65–99)
Potassium: 6.7 mmol/L (ref 3.5–5.2)
Potassium: 4.8 mmol/L (ref 3.5–5.1)
Sodium: 141 mmol/L (ref 134–144)
Sodium: 139 mmol/L (ref 135–145)
Total Bilirubin: 0.4 mg/dL (ref 0.3–1.2)
Total Protein: 6.7 g/dL (ref 6.0–8.5)
Total Protein: 6.5 g/dL (ref 6.5–8.1)

## 2019-07-23 LAB — CBC WITH DIFFERENTIAL/PLATELET
Abs Immature Granulocytes: 0.01 10*3/uL (ref 0.00–0.07)
Basophils Absolute: 0 10*3/uL (ref 0.0–0.1)
Basophils Relative: 1 %
Eosinophils Absolute: 0.3 10*3/uL (ref 0.0–0.5)
Eosinophils Relative: 6 %
HCT: 39.5 % (ref 36.0–46.0)
Hemoglobin: 12.4 g/dL (ref 12.0–15.0)
Immature Granulocytes: 0 %
Lymphocytes Relative: 41 %
Lymphs Abs: 2.2 10*3/uL (ref 0.7–4.0)
MCH: 31.1 pg (ref 26.0–34.0)
MCHC: 31.4 g/dL (ref 30.0–36.0)
MCV: 99 fL (ref 80.0–100.0)
Monocytes Absolute: 0.3 10*3/uL (ref 0.1–1.0)
Monocytes Relative: 5 %
Neutro Abs: 2.5 10*3/uL (ref 1.7–7.7)
Neutrophils Relative %: 47 %
Platelets: 240 10*3/uL (ref 150–400)
RBC: 3.99 MIL/uL (ref 3.87–5.11)
RDW: 11.6 % (ref 11.5–15.5)
WBC: 5.4 10*3/uL (ref 4.0–10.5)
nRBC: 0 % (ref 0.0–0.2)

## 2019-07-23 LAB — SAR COV2 SEROLOGY (COVID19)AB(IGG),IA: DiaSorin SARS-CoV-2 Ab, IgG: NEGATIVE
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
## 2019-07-23 NOTE — Discharge Instructions (Signed)
Your potassium was rechecked today and was within normal limits. Please follow up with your neurologist for continued workup of your headaches including imaging.

## 2019-07-23 NOTE — ED Triage Notes (Addendum)
Patient reports she had labs done yesterday, was called this am and told her K+was elevated (6.7 per results in chart) and she needed to come in. Pt reports she feels unwell, and endorses palpitations.

## 2019-07-23 NOTE — Telephone Encounter (Signed)
Medicare/medicaid order sent to GI. No auth they will reach out to the patient to schedule.  °

## 2019-08-02 ENCOUNTER — Ambulatory Visit
Admission: RE | Admit: 2019-08-02 | Discharge: 2019-08-02 | Disposition: A | Discharge status: Home / Self Care | Payer: Medicare Other | Source: Ambulatory Visit | Attending: Neurology | Admitting: Neurology

## 2019-08-02 DIAGNOSIS — R51 Headache with orthostatic component, not elsewhere classified: Secondary | ICD-10-CM

## 2019-08-02 DIAGNOSIS — R5383 Other fatigue: Secondary | ICD-10-CM

## 2019-08-02 DIAGNOSIS — H93A9 Pulsatile tinnitus, unspecified ear: Secondary | ICD-10-CM

## 2019-08-02 MED ORDER — IOPAMIDOL (ISOVUE-370) INJECTION 76%
75.0000 mL | Freq: Once | INTRAVENOUS | Status: AC | PRN
  Administered 2019-08-02: 14:00:00 75 mL via INTRAVENOUS

## 2019-08-06 MED FILL — BIKTARVY 50-200-25 MG TABS: 50-200-25 | 30 days supply | Qty: 30 | Fill #9

## 2019-08-20 ENCOUNTER — Ambulatory Visit: Payer: Medicare Other | Admitting: Neurology

## 2019-08-21 ENCOUNTER — Encounter: Payer: Self-pay | Admitting: Internal Medicine

## 2019-08-21 ENCOUNTER — Other Ambulatory Visit: Payer: Self-pay

## 2019-08-21 ENCOUNTER — Ambulatory Visit (INDEPENDENT_AMBULATORY_CARE_PROVIDER_SITE_OTHER): Payer: Medicare Other | Admitting: Internal Medicine

## 2019-08-21 VITALS — BP 138/70 | HR 84 | Temp 98.9°F | Ht 66.0 in | Wt 109.0 lb

## 2019-08-21 DIAGNOSIS — N8189 Other female genital prolapse: Secondary | ICD-10-CM | POA: Diagnosis not present

## 2019-08-21 DIAGNOSIS — R1012 Left upper quadrant pain: Secondary | ICD-10-CM | POA: Diagnosis not present

## 2019-08-21 DIAGNOSIS — G8929 Other chronic pain: Secondary | ICD-10-CM

## 2019-08-21 DIAGNOSIS — R1115 Cyclical vomiting syndrome unrelated to migraine: Secondary | ICD-10-CM | POA: Diagnosis not present

## 2019-08-21 MED ORDER — OMEPRAZOLE-SODIUM BICARBONATE 20-1100 MG PO CAPS: 1.0000 | ORAL_CAPSULE | Freq: Every day | ORAL | 0 refills | Status: DC

## 2019-08-21 NOTE — Addendum Note (Signed)
Addended by: [place], Arushi Partridge E on: 08/21/2019 05:23 PM   Modules accepted: Orders

## 2019-08-21 NOTE — Patient Instructions (Signed)
Please use [facility] and get Zegerid over the counter. Take one at bedtime.  Dr Leone Payor is going [facility] you to a [facility].    I appreciate the opportunity to care for you. Sally Head, MD, [facility]
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
## 2019-08-22 ENCOUNTER — Telehealth: Payer: Self-pay

## 2019-08-22 NOTE — Telephone Encounter (Signed)
She can ask her neurologist about the vagus nerve disorder when she sees them in June

## 2019-08-22 NOTE — Telephone Encounter (Signed)
Per Dr Leone Payor patient referred to Dr Beverly Gust at [facility]. First available appointment made for 12/04/2019 at 9:30AM. They will put her on a cancellation list to hopefully get seen sooner. They will mail her out information. I am faxing them information as well. Patient informed of appointment date/time.    When speaking with the patient she said that after her visit yesterday with Dr Leone Payor she was goggling and came across Vagus Nerve disorder. She said she has all the symptoms. She wanted to pass this information on as Dr Leone Payor had mentioned that her issues could possibly have a neurologic basis.

## 2019-08-22 NOTE — Telephone Encounter (Signed)
Patient informed. 

## 2019-09-10 MED FILL — BIKTARVY 50-200-25 MG TABS: 50-200-25 | 30 days supply | Qty: 30 | Fill #10

## 2019-10-04 MED FILL — BIKTARVY 50-200-25 MG TABS: 50-200-25 | 30 days supply | Qty: 30 | Fill #11

## 2019-11-04 ENCOUNTER — Other Ambulatory Visit: Payer: Self-pay | Admitting: Infectious Disease

## 2019-11-11 NOTE — Progress Notes (Signed)
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 muscle tone.    Posture:    Posture is normal. normal erect    Strength:    Strength is V/V in the upper and lower limbs.      Sensation: intact to LT      Assessment/Plan: 39 y.o. female here as a follow up for multiple diagnoses. Routind EEG - dysrhythmic theta activity and sharp transients emanating from the left temporal region. MRI brain unremarkable. 3-day ambulatory EEG monitoring - occasional single bursts of sharp epileptiform discharges that were generalized. These were increased in frequency during sleep. Occasional sharp discharges also seen in the right parietal head region.She was on the Lamictal  twice daily and had a seizure. Increased to  twice daily now doing well.   Meds ordered this encounter  Medications  . Fremanezumab-vfrm (AJOVY) 225 MG/1.5ML SOAJ    Sig: Inject 225 mg into the skin every 30 (thirty) days.    Dispense:  1 pen    Refill:  11      Seizures: continue Lamictal which is also helpful for her mood disorder. Doing well.stable.  Trigeminal neuralgia: Lamictal helps, neurontin or baclofen prn, doing well, stable Meralgia Paresthetica: monitor clincally, stable no changes, no  episodes since last being seen. Migraines: triptan and anti-emetic acutely, On aimovig significantly improved but wearing off and can complicate her GI problems, gave her Ajovy samples will see if we can get that or emgality instread. Continue. gabapentin, lamictal, baclofen, zofrn and riztriptan. Stress/Social stressors: tried to encourage patient to see therapist and osychiatrist, she is going through significant stress in her life.   Still experiencing GI problems, has appointment with specialist soon. Unclear nausea/vomiting and weight loss she is following with multiple specialists, discussed mesenteric insufficiency, also discussed POTs which she brought up, I'm not sure these explain all her symptoms but this is not my area of expertise. Still is very curious, medication related? We screened her for porphyria in the past was neg. She has a large amount of stool in her colon, she has opioid indced constipation tried Movantik which caused side effects. WF/u with GI and Porfirio Oar   Discussed: To prevent or relieve headaches, try the following: Cool Compress. Lie down and place a cool compress on your head.  Avoid headache triggers. If certain foods or odors seem to have triggered your migraines in the past, avoid them. A headache diary might help you identify triggers.  Include physical activity in your daily routine. Try a daily walk or other moderate aerobic exercise.  Manage stress. Find healthy ways to cope with the stressors, such as delegating tasks on your to-do list.  Practice relaxation techniques. Try deep breathing, yoga, massage and visualization.  Eat regularly. Eating regularly scheduled meals and maintaining a healthy diet might help prevent headaches. Also, drink plenty of fluids.  Follow a regular sleep schedule. Sleep deprivation might contribute  to headaches Consider biofeedback. With this mind-body technique, you learn to control certain bodily functions -- such as muscle  tension, heart rate and blood pressure -- to prevent headaches or reduce headache pain.    Proceed to emergency room if you experience new or worsening symptoms or symptoms do not resolve, if you have new neurologic symptoms or if headache is severe, or for any concerning symptom.   Provided education and documentation from American headache Society toolbox including articles on: chronic migraine medication overuse headache, chronic migraines, prevention of migraines, behavioral and other nonpharmacologic treatments for headache.    Naomie Dean, MD  [facility] [address] [place], [place] [address]   I spent 35 minutes of face-to-face and non-face-to-face time with patient on the  1. Chronic migraine without aura without status migrainosus, not intractable   2. Partial seizures (HCC)   3. Chronic migraine    diagnosis.  This included previsit chart review, lab review, study review, order entry, electronic health record documentation, patient education on the different diagnostic and therapeutic options, counseling and coordination of care, risks and benefits of management, compliance, or risk factor reduction

## 2019-11-12 ENCOUNTER — Telehealth: Payer: Self-pay | Admitting: Neurology

## 2019-11-12 ENCOUNTER — Ambulatory Visit (INDEPENDENT_AMBULATORY_CARE_PROVIDER_SITE_OTHER): Payer: Medicare Other | Admitting: Neurology

## 2019-11-12 ENCOUNTER — Other Ambulatory Visit: Payer: Self-pay | Admitting: Neurology

## 2019-11-12 ENCOUNTER — Encounter: Payer: Self-pay | Admitting: Neurology

## 2019-11-12 VITALS — BP 100/70 | HR 87 | Ht 66.0 in | Wt 105.0 lb

## 2019-11-12 DIAGNOSIS — G43709 Chronic migraine without aura, not intractable, without status migrainosus: Secondary | ICD-10-CM

## 2019-11-12 DIAGNOSIS — R569 Unspecified convulsions: Secondary | ICD-10-CM | POA: Diagnosis not present

## 2019-11-12 DIAGNOSIS — IMO0002 Reserved for concepts with insufficient information to code with codable children: Secondary | ICD-10-CM

## 2019-11-12 MED ORDER — AJOVY 225 MG/1.5ML ~~LOC~~ SOAJ: 225.0000 mg | SUBCUTANEOUS | 11 refills | Status: DC

## 2019-11-12 NOTE — Telephone Encounter (Signed)
I will give patient a call, thanks

## 2019-11-12 NOTE — Telephone Encounter (Signed)
[name], sorry this Pt was scheduled for 6 months out but she usually comes every 4 months so she wanted to do a 4 mo fu. I scheduled her for 6 mo fu per Dr Lucia Gaskins. Can you call her to discuss. Thanks

## 2019-11-18 DIAGNOSIS — M79641 Pain in right hand: Secondary | ICD-10-CM | POA: Insufficient documentation

## 2019-11-18 NOTE — Telephone Encounter (Signed)
Called the patient back to advise that per last office note Dr Lucia Gaskins recommended 6 mth follow up with Dr Lucia Gaskins. Advised that per Dr Lucia Gaskins suggestion 6 mth follow up is what she mentioned and Dr Lucia Gaskins was also aware that we had her down for 6 mth. Patient has upcoming apt in dec and advised to keep that apt and that if anything comes up she can always give [place] a call. Pt verbalized understanding.

## 2019-11-18 NOTE — Telephone Encounter (Signed)
Pt called wanting to know if she is to schedule a 4 month or should she keep the 6 month appt that is scheduled. Please advise.

## 2019-11-20 ENCOUNTER — Ambulatory Visit: Payer: Self-pay | Admitting: Neurology

## 2019-12-02 MED FILL — BIKTARVY 50-200-25 MG TABS: 50-200-25 | 30 days supply | Qty: 30 | Fill #1

## 2019-12-26 MED FILL — BIKTARVY 50-200-25 MG TABS: 50-200-25 | 30 days supply | Qty: 30 | Fill #2

## 2020-01-22 MED FILL — BIKTARVY 50-200-25 MG TABS: 50-200-25 | 30 days supply | Qty: 30 | Fill #3

## 2020-02-04 ENCOUNTER — Other Ambulatory Visit: Payer: Medicare Other

## 2020-02-04 ENCOUNTER — Other Ambulatory Visit: Payer: Self-pay

## 2020-02-04 DIAGNOSIS — B20 Human immunodeficiency virus [HIV] disease: Secondary | ICD-10-CM

## 2020-02-04 DIAGNOSIS — Z79899 Other long term (current) drug therapy: Secondary | ICD-10-CM

## 2020-02-05 LAB — T-HELPER CELL (CD4) - (RCID CLINIC ONLY)
CD4 % Helper T Cell: 46 % (ref 33–65)
CD4 T Cell Abs: 919 /uL (ref 400–1790)

## 2020-02-07 LAB — COMPLETE METABOLIC PANEL WITH GFR
AG Ratio: 2 (calc) (ref 1.0–2.5)
ALT: 10 U/L (ref 6–29)
AST: 16 U/L (ref 10–30)
Albumin: 4.4 g/dL (ref 3.6–5.1)
Alkaline phosphatase (APISO): 65 U/L (ref 31–125)
BUN: 11 mg/dL (ref 7–25)
CO2: 28 mmol/L (ref 20–32)
Calcium: 9.5 mg/dL (ref 8.6–10.2)
Chloride: 106 mmol/L (ref 98–110)
Creat: 0.95 mg/dL (ref 0.50–1.10)
GFR, Est African American: 87 mL/min/{1.73_m2} (ref >=60)
GFR, Est Non African American: 75 mL/min/{1.73_m2} (ref >=60)
Globulin: 2.2 g/dL (calc) (ref 1.9–3.7)
Glucose, Bld: 146 mg/dL — ABNORMAL HIGH (ref 65–99)
Potassium: 4.6 mmol/L (ref 3.5–5.3)
Sodium: 141 mmol/L (ref 135–146)
Total Bilirubin: 0.4 mg/dL (ref 0.2–1.2)
Total Protein: 6.6 g/dL (ref 6.1–8.1)

## 2020-02-07 LAB — CBC WITH DIFFERENTIAL/PLATELET
Absolute Monocytes: 254 cells/uL (ref 200–950)
Basophils Absolute: 42 cells/uL (ref 0–200)
Basophils Relative: 0.9 %
Eosinophils Absolute: 141 cells/uL (ref 15–500)
Eosinophils Relative: 3 %
HCT: 38.5 % (ref 35.0–45.0)
Hemoglobin: 12.8 g/dL (ref 11.7–15.5)
Lymphs Abs: 2082 cells/uL (ref 850–3900)
MCH: 31.7 pg (ref 27.0–33.0)
MCHC: 33.2 g/dL (ref 32.0–36.0)
MCV: 95.3 fL (ref 80.0–100.0)
MPV: 9.2 fL (ref 7.5–12.5)
Monocytes Relative: 5.4 %
Neutro Abs: 2181 cells/uL (ref 1500–7800)
Neutrophils Relative %: 46.4 %
Platelets: 193 10*3/uL (ref 140–400)
RBC: 4.04 10*6/uL (ref 3.80–5.10)
RDW: 12 % (ref 11.0–15.0)
Total Lymphocyte: 44.3 %
WBC: 4.7 10*3/uL (ref 3.8–10.8)

## 2020-02-07 LAB — RPR: RPR Ser Ql: NONREACTIVE

## 2020-02-07 LAB — HIV-1 RNA QUANT-NO REFLEX-BLD
HIV 1 RNA Quant: <20 Copies/mL
HIV-1 RNA Quant, Log: <1.3 Log cps/mL

## 2020-02-07 LAB — LIPID PANEL
Cholesterol: 172 mg/dL (ref <200)
HDL: 42 mg/dL — ABNORMAL LOW (ref >=50)
LDL Cholesterol (Calc): 111 mg/dL (calc) — ABNORMAL HIGH
Non-HDL Cholesterol (Calc): 130 mg/dL (calc) — ABNORMAL HIGH (ref <130)
Total CHOL/HDL Ratio: 4.1 (calc) (ref <5.0)
Triglycerides: 93 mg/dL (ref <150)

## 2020-02-17 ENCOUNTER — Other Ambulatory Visit: Payer: Self-pay

## 2020-02-17 ENCOUNTER — Encounter: Payer: Self-pay | Admitting: Infectious Disease

## 2020-02-17 ENCOUNTER — Other Ambulatory Visit: Payer: Self-pay | Admitting: Infectious Disease

## 2020-02-17 ENCOUNTER — Ambulatory Visit (INDEPENDENT_AMBULATORY_CARE_PROVIDER_SITE_OTHER): Payer: Medicare Other | Admitting: Infectious Disease

## 2020-02-17 VITALS — BP 98/67 | HR 67 | Temp 98.2°F | Resp 17 | Ht 66.0 in | Wt 104.0 lb

## 2020-02-17 DIAGNOSIS — B20 Human immunodeficiency virus [HIV] disease: Secondary | ICD-10-CM

## 2020-02-17 DIAGNOSIS — F17200 Nicotine dependence, unspecified, uncomplicated: Secondary | ICD-10-CM

## 2020-02-17 DIAGNOSIS — R112 Nausea with vomiting, unspecified: Secondary | ICD-10-CM | POA: Diagnosis not present

## 2020-02-17 DIAGNOSIS — Z87898 Personal history of other specified conditions: Secondary | ICD-10-CM | POA: Diagnosis not present

## 2020-02-17 DIAGNOSIS — F1991 Other psychoactive substance use, unspecified, in remission: Secondary | ICD-10-CM

## 2020-02-17 DIAGNOSIS — K311 Adult hypertrophic pyloric stenosis: Secondary | ICD-10-CM

## 2020-02-17 DIAGNOSIS — R569 Unspecified convulsions: Secondary | ICD-10-CM

## 2020-02-17 HISTORY — DX: Adult hypertrophic pyloric stenosis: K31.1

## 2020-02-17 MED ORDER — CHANTIX STARTING MONTH PAK 0.5 MG X 11 & 1 MG X 42 PO TABS: ORAL_TABLET | ORAL | 0 refills | Status: DC

## 2020-02-17 MED ORDER — BIKTARVY 50-200-25 MG PO TABS: 1.0000 | ORAL_TABLET | Freq: Every day | ORAL | 11 refills | Status: DC

## 2020-02-17 MED FILL — APO-VARENICLINE 0.5 MG TABS: 0.5 | 17 days supply | Qty: 53 | Fill #0
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
## 2020-02-18 MED FILL — BIKTARVY 50-200-25 MG TABS: 50-200-25 | 30 days supply | Qty: 30 | Fill #0

## 2020-03-17 MED FILL — BIKTARVY 50-200-25 MG TABS: 50-200-25 | 30 days supply | Qty: 30 | Fill #1

## 2020-03-22 ENCOUNTER — Other Ambulatory Visit: Payer: Self-pay | Admitting: Neurology

## 2020-04-13 ENCOUNTER — Telehealth: Payer: Self-pay | Admitting: Neurology

## 2020-04-13 DIAGNOSIS — G43709 Chronic migraine without aura, not intractable, without status migrainosus: Secondary | ICD-10-CM

## 2020-04-13 MED ORDER — AJOVY 225 MG/1.5ML ~~LOC~~ SOAJ: 225.0000 mg | SUBCUTANEOUS | 11 refills | Status: DC

## 2020-04-13 NOTE — Telephone Encounter (Signed)
Also received a PA to complete with Medicaid. I completed this on Cover My Meds as well. Key: [id number]. Awaiting determination.

## 2020-04-13 NOTE — Telephone Encounter (Signed)
That's fine please prescribe with 11 refills and we can try

## 2020-04-13 NOTE — Telephone Encounter (Signed)
Ajovy 225 mg pen 1.5 refills 11 prescribed to pt's pharmacy along with this savings offer which will work even if insurance denies. Pt can even get the first dose with this offer before PA is done. BIN: [id number], PCN: [id number], GROUP: [id number], ID: [id number].

## 2020-04-13 NOTE — Telephone Encounter (Signed)
Pt called wanting to inform the provider that the Emgality is not working for her. Please advise.

## 2020-04-13 NOTE — Telephone Encounter (Signed)
Spoke with pt. She didn't recall exactly when she started the [facility] but stated it did not work. The Ajovy did help. Patient would like to stop the Emgality and change to another medication.   I called [facility] and spoke with staff member who confirmed pt last filled Emgality on 03/31/20 and this is the only time she has filled it.

## 2020-04-13 NOTE — Telephone Encounter (Signed)
Ajovy PA completed on Cover My Meds. Key: [id number]. Awaiting determination from [facility].

## 2020-04-13 NOTE — Addendum Note (Signed)
Addended by: Bertram Savin on: 04/13/2020 03:07 PM   Modules accepted: Orders

## 2020-04-15 ENCOUNTER — Telehealth: Payer: Self-pay | Admitting: Neurology

## 2020-04-15 NOTE — Telephone Encounter (Signed)
Please advise patient to make an appointment with the primary care physician or PA.  Brain fog and hallucinations are not common side effects with any of the injectable preventative medications.  I would like for her to get checked out from the medical standpoint.

## 2020-04-15 NOTE — Telephone Encounter (Signed)
Pt. asks is there something else to make emgality stop working. Please advise.  [facility]armacy[facility] [phone]

## 2020-04-15 NOTE — Telephone Encounter (Signed)
I called pt. She is aware that the [facility] PAs are pending. She reports that since her first emgality injection about 2 weeks ago she has had side effects including headaches, a couple migraines, "brain fog", and even perhaps visual hallucinations. She has been drinking plenty of water and getting plenty of rest. She wants to know if there is anything that would help alleviate these side effects. I advised her that Dr. Lucia Gaskins is out of the office and I will speak to the [facility]. Pt verbalized understanding.

## 2020-04-15 NOTE — Telephone Encounter (Signed)
I called pt. I discussed Dr. Teofilo Pod recommendations with her. Pt reports that she already has a PCP appt scheduled. Pt will let us know if she has further questions or concerns.

## 2020-04-16 MED FILL — BIKTARVY 50-200-25 MG TABS: 50-200-25 | 30 days supply | Qty: 30 | Fill #2

## 2020-04-20 NOTE — Telephone Encounter (Signed)
Pt. states she was sent a letter that Fremanezumab-vfrm (AJOVY) 225 MG/1.5ML SOAJ  PA was denied by Medicaid. She is asking that it be sent thru [employer].  [facility] [phone]

## 2020-04-20 NOTE — Telephone Encounter (Signed)
Per Cover My Meds, the PA with [employer] medic[employer] has been denied as well. There was no detailed information provided as to why. They provided a number to call, [phone] for further questions.

## 2020-04-22 ENCOUNTER — Encounter: Payer: Self-pay | Admitting: *Deleted

## 2020-04-22 NOTE — Telephone Encounter (Signed)
Late entry from 04/21/20 I called [employer] and spoke with a representative who stated the Ajovy is non-formulary and that's why it was denied. Even though pt has tried/failed plenty of medications. I was told an appeal would be required. I was provided both urgent and standard appeal fax numbers. I compiled an urgent appeal letter and faxed to [employer] today @ [phone]. Received a receipt of confirmation.

## 2020-04-27 ENCOUNTER — Telehealth: Payer: Self-pay | Admitting: Neurology

## 2020-04-27 NOTE — Telephone Encounter (Signed)
Called pt's insurance, medication has been approved through 05/20/2021.  Spoke to pt's pharmacy they were able to reprocess Ajovy.  Called pt and informed her that medication will be ready today for pick up. Pt states she took nurtec today and has a mild dull headache. She will start ajovy today. And call back if sx do not improve. Pt was very appreciative for call.

## 2020-04-27 NOTE — Telephone Encounter (Signed)
Pt called stating that there has been an issue with her insurance companies and getting the auth for her headache medication and she is wanting to know if there is anyway she can get a sample to help her with the headache that she has been dealing with for several days while the insurance situation is fixed. Please advise.

## 2020-05-14 ENCOUNTER — Ambulatory Visit: Payer: Medicare Other | Admitting: Neurology

## 2020-05-14 ENCOUNTER — Other Ambulatory Visit: Payer: Self-pay

## 2020-05-14 ENCOUNTER — Encounter: Payer: Self-pay | Admitting: Neurology

## 2020-05-14 ENCOUNTER — Ambulatory Visit (INDEPENDENT_AMBULATORY_CARE_PROVIDER_SITE_OTHER): Payer: Medicare Other | Admitting: Neurology

## 2020-05-14 VITALS — BP 103/59 | HR 80 | Ht 66.0 in | Wt 103.0 lb

## 2020-05-14 DIAGNOSIS — G500 Trigeminal neuralgia: Secondary | ICD-10-CM | POA: Diagnosis not present

## 2020-05-14 DIAGNOSIS — G40009 Localization-related (focal) (partial) idiopathic epilepsy and epileptic syndromes with seizures of localized onset, not intractable, without status epilepticus: Secondary | ICD-10-CM | POA: Diagnosis not present

## 2020-05-14 DIAGNOSIS — G43709 Chronic migraine without aura, not intractable, without status migrainosus: Secondary | ICD-10-CM

## 2020-05-14 NOTE — Progress Notes (Signed)
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      Sensation: intact to LT      Assessment/Plan: 39 y.o. female here as a follow up for multiple diagnoses. Routind EEG - dysrhythmic theta activity and sharp transients emanating from the left temporal region. MRI brain unremarkable. 3-day ambulatory EEG monitoring - occasional single bursts of sharp epileptiform discharges that were generalized. These were increased in frequency during sleep. Occasional sharp discharges also seen in the right parietal head region.She was on the Lamictal 150mg  twice daily and had a seizure. Increased to 250mg  twice daily now doing well.   Seizures: continue Lamictal which is also helpful for her mood disorder. Doing well.stable.  Trigeminal neuralgia: Lamictal helps, neurontin or baclofen prn, doing well, stable Meralgia Paresthetica: monitor clincally, stable no changes, no episodes since last being seen. Migraines: triptan and anti-emetic acutely, On aimovig significantly improved but wearing off and can complicate her GI problems, on ajovy. Continue. gabapentin, lamictal, baclofen, zofrn and  riztriptan. Stress/Social stressors: tried to encourage patient to see therapist and osychiatrist, she is going through significant stress in her life. We discussed stress today, a lot of family issues especially with her children's father.   PRIOR:  Still experiencing GI problems, has appointment with specialist soon. Unclear nausea/vomiting and weight loss she is following with multiple specialists, discussed mesenteric insufficiency, also discussed POTs which she brought up, I'm not sure these explain all her symptoms but this is not my area of expertise. Still is very curious, medication related? We screened her for porphyria in the past was neg. She has a large amount of stool in her colon, she has opioid indced constipation tried Movantik which caused side effects. WF/u with GI and Porfirio Oar   Discussed: To prevent or relieve headaches, try the following: Cool Compress. Lie down and place a cool compress on your head.  Avoid headache triggers. If certain foods or odors seem to have triggered your migraines in the past, avoid them. A headache diary might help you identify triggers.  Include physical activity in your daily routine. Try a daily walk or other moderate aerobic exercise.  Manage stress. Find healthy ways to cope with the stressors, such as delegating tasks on your to-do list.  Practice relaxation techniques. Try deep breathing, yoga, massage and visualization.  Eat regularly. Eating regularly scheduled meals and maintaining a healthy diet might help prevent headaches. Also, drink plenty of fluids.  Follow a regular sleep schedule. Sleep deprivation might contribute to headaches Consider biofeedback. With this mind-body technique, you learn to control certain bodily functions -- such as muscle tension, heart rate and blood pressure -- to prevent headaches or reduce headache pain.    Proceed to emergency room if you experience new or worsening symptoms or symptoms do not  resolve, if you have new neurologic symptoms or if headache is  severe, or for any concerning symptom.   Provided education and documentation from [employer] toolbox including articles on: chronic migraine medication overuse headache, chronic migraines, prevention of migraines, behavioral and other nonpharmacologic treatments for headache.    Naomie Dean, MD  [facility] [address] [place], [place] [address]   I spent 25 minutes of face-to-face and non-face-to-face time with patient on the  1. Chronic migraine without aura without status migrainosus, not intractable   2. Partial idiopathic epilepsy with seizures of localized onset, not intractable, without status epilepticus (HCC)   3. Trigeminal neuralgia of right side of face    diagnosis.  This included previsit chart review, lab review, study review, order entry, electronic health record documentation, patient education on the different diagnostic and therapeutic options, counseling and coordination of care, risks and benefits of management, compliance, or risk factor reduction

## 2020-05-14 NOTE — Patient Instructions (Signed)
4 months

## 2020-05-18 MED FILL — BIKTARVY 50-200-25 MG TABS: 50-200-25 | 30 days supply | Qty: 30 | Fill #3

## 2020-05-30 DIAGNOSIS — F22 Delusional disorders: Secondary | ICD-10-CM

## 2020-05-30 HISTORY — DX: Delusional disorders: F22

## 2020-06-03 ENCOUNTER — Other Ambulatory Visit: Payer: Self-pay | Admitting: Neurology

## 2020-06-03 DIAGNOSIS — R569 Unspecified convulsions: Secondary | ICD-10-CM

## 2020-06-03 DIAGNOSIS — R110 Nausea: Secondary | ICD-10-CM

## 2020-06-05 DIAGNOSIS — R41840 Attention and concentration deficit: Secondary | ICD-10-CM | POA: Insufficient documentation

## 2020-06-18 MED FILL — BIKTARVY 50-200-25 MG TABS: 50-200-25 | 30 days supply | Qty: 30 | Fill #4

## 2020-07-24 MED FILL — BIKTARVY 50-200-25 MG TABS: 50-200-25 | 30 days supply | Qty: 30 | Fill #5

## 2020-08-19 MED FILL — BIKTARVY 50-200-25 MG TABS: 50-200-25 | 30 days supply | Qty: 30 | Fill #6

## 2020-08-21 ENCOUNTER — Other Ambulatory Visit (HOSPITAL_COMMUNITY): Payer: Self-pay

## 2020-09-08 NOTE — Progress Notes (Signed)
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    Allergies as of 09/09/2020 - Review Complete 09/09/2020  Allergen Reaction Noted  . Naloxone Hives 02/17/2020  . Glycopyrrolate Rash 03/06/2015    Vitals: BP 108/68 (BP Location: Right Arm, Patient Position: Sitting)   Pulse 78   Ht 5\' 6"  (1.676 m)   Wt 108 lb (49 kg)   LMP 01/08/2016   SpO2 96%   BMI 17.43 kg/m  Last Weight:  Wt Readings from Last 1 Encounters:  09/09/20 108 lb (49 kg)   Last Height:   Ht Readings from Last 1 Encounters:  09/09/20 5\' 6"  (1.676 m)   Exam: NAD, pleasant, thin                Speech:    Speech is normal; fluent and spontaneous with normal comprehension.  Cognition:    The patient is oriented to person, place, and time;     recent and remote memory intact;     language fluent;    Cranial Nerves:    The pupils are equal, round, and reactive to light.Trigeminal sensation is intact and the muscles of mastication are normal. The face is symmetric. The palate elevates in the midline. Hearing intact. Voice is normal. Shoulder shrug is normal. The tongue has normal motion without fasciculations.   Coordination:  No dysmetria  Motor Observation:    No asymmetry, no atrophy,  and no involuntary movements noted. Tone:    Normal muscle tone.     Strength:    Strength is V/V in the upper and lower limbs.      Sensation: intact to LT   Assessment/Plan: 40 y.o. female here as a follow up for multiple diagnoses. Routind EEG - dysrhythmic theta activity and sharp transients emanating from the left temporal region(confirmed recently at EMU with Dr. who made no changes). MRI brain unremarkable 2020 also MRI cervical spine 2016 unremarkable. 3-day ambulatory EEG monitoring - occasional single bursts of sharp epileptiform discharges that were generalized. These were increased in frequency during sleep. Occasional sharp discharges also seen in the right parietal head region.She was on the Lamictal 150mg  twice daily and had a seizure. Increased to 250mg  twice daily now doing well. Also has migraines, refractory depression, trigeminal neuralgia, meralgia paresthetica, stress/social stressors, HIV well controlled, chronic nausea and failure to thrive(BMI17)  TMS therapy, [place], intractable depression refractory to medications, anxiety, weight loss, migraines, triued and faile multiple medications even ECT. Also may consider botox, literature is there to say it can help with depression and she has chronic migraines as well.   Seizures: continue Lamictal which is also helpful for her mood disorder. Doing well.stable. Saw Dr, 2021, had EMU, say epileptiform activity on EEG, no seizures, no changes to medication. No recent seizures.   Trigeminal neuralgia: Lamictal helps, neurontin or baclofen prn, doing well, stable  Meralgia Paresthetica: monitor clincally, stable no changes, still no episodes since last being seen.  Migraines: triptan and anti-emetic acutely, On CGRPsignificantly improved but wearing off and still has 15 headache days and 8 migraine days a month. and can complicate her GI problems, on ajovy. Continue. gabapentin, lamictal, baclofen, zofrn and  riztriptan. Consider Botox: she has chronic migraines, 15 headache days a month and 8 migraine days a month lasting  24 hours moderate to severe, ongoing over a year, has tried everything, no medication overuse, no aura, migraines are unilateral or bilateral, pulsating pounding throbbing, she has lots of nausea, light sensitivity, sound sensitivity, movement makes it worse, refractory.  Stress/Social stressors: tried to encourage patient to see therapist and osychiatrist, she is going through significant stress in her life. We discussed stress today, a lot of family issues especially with her children's father.   PRIOR:  Still experiencing GI problems, has appointment with specialist soon. Unclear nausea/vomiting and weight loss she is following with multiple specialists, discussed mesenteric insufficiency, also discussed POTs which she brought up, I'm not sure these explain all her symptoms but this is not my area of expertise. Still is very curious, medication related? We screened her for porphyria in the past was neg. She has a large amount of stool in her colon, she has opioid indced constipation tried Movantik which caused side effects. WF/u with GI and Porfirio Oar   Discussed: To prevent or relieve headaches, try the following: Cool Compress. Lie down and place a cool compress on your head.  Avoid headache triggers. If certain foods or odors seem to have triggered your migraines in the past, avoid them. A headache diary might help you identify triggers.  Include physical activity in your daily routine. Try a daily walk or other moderate aerobic exercise.  Manage stress. Find healthy ways to cope with the stressors, such as delegating tasks on your to-do list.  Practice relaxation techniques. Try deep breathing, yoga, massage and visualization.  Eat regularly. Eating regularly scheduled meals and maintaining a healthy diet might help prevent headaches. Also, drink plenty of fluids.  Follow a  regular sleep schedule. Sleep deprivation might contribute to headaches Consider biofeedback. With this mind-body technique, you learn to control certain bodily functions -- such as muscle tension, heart rate and blood pressure -- to prevent headaches or reduce headache pain.    Proceed to emergency room if you experience new or worsening symptoms or symptoms do not resolve, if you have new neurologic symptoms or if headache is severe, or for any concerning symptom.   Provided education and documentation from American headache Society toolbox including articles on: chronic migraine medication overuse headache, chronic migraines, prevention of migraines, behavioral and other nonpharmacologic treatments for headache.    Naomie Dean, MD  [facility] [address] [place], [place] [address]  I spent over 60 minutes of face-to-face and non-face-to-face time with patient on the  1. Chronic migraine without aura without status migrainosus, not intractable   2. Severe episode of recurrent major depressive disorder, without psychotic features (HCC)   3. Anxiety and depression   4. Refractory migraine   5. Long-term use of high-risk medication   6. Nausea   7. Partial idiopathic epilepsy with seizures of localized onset, intractable, without status epilepticus (HCC)    diagnosis.  This included previsit chart review, lab review, study review, order entry, electronic health record documentation, patient education on the different diagnostic and therapeutic options, counseling and coordination of care, risks and benefits of management, compliance, or risk factor reduction

## 2020-09-09 ENCOUNTER — Encounter: Payer: Self-pay | Admitting: Neurology

## 2020-09-09 ENCOUNTER — Ambulatory Visit (INDEPENDENT_AMBULATORY_CARE_PROVIDER_SITE_OTHER): Payer: Medicare Other | Admitting: Neurology

## 2020-09-09 ENCOUNTER — Telehealth: Payer: Self-pay | Admitting: Neurology

## 2020-09-09 VITALS — BP 108/68 | HR 78 | Ht 66.0 in | Wt 108.0 lb

## 2020-09-09 DIAGNOSIS — Z79899 Other long term (current) drug therapy: Secondary | ICD-10-CM

## 2020-09-09 DIAGNOSIS — G43709 Chronic migraine without aura, not intractable, without status migrainosus: Secondary | ICD-10-CM

## 2020-09-09 DIAGNOSIS — G43919 Migraine, unspecified, intractable, without status migrainosus: Secondary | ICD-10-CM

## 2020-09-09 DIAGNOSIS — G40019 Localization-related (focal) (partial) idiopathic epilepsy and epileptic syndromes with seizures of localized onset, intractable, without status epilepticus: Secondary | ICD-10-CM

## 2020-09-09 DIAGNOSIS — R110 Nausea: Secondary | ICD-10-CM

## 2020-09-09 DIAGNOSIS — F332 Major depressive disorder, recurrent severe without psychotic features: Secondary | ICD-10-CM | POA: Diagnosis not present

## 2020-09-09 DIAGNOSIS — F419 Anxiety disorder, unspecified: Secondary | ICD-10-CM

## 2020-09-09 DIAGNOSIS — F32A Depression, unspecified: Secondary | ICD-10-CM

## 2020-09-09 MED ORDER — RIZATRIPTAN BENZOATE 10 MG PO TABS: 10.0000 mg | ORAL_TABLET | ORAL | 11 refills | Status: DC | PRN

## 2020-09-09 MED ORDER — GABAPENTIN 300 MG PO CAPS: ORAL_CAPSULE | ORAL | 4 refills | Status: DC

## 2020-09-09 MED ORDER — AJOVY 225 MG/1.5ML ~~LOC~~ SOAJ: 225.0000 mg | SUBCUTANEOUS | 11 refills | Status: DC

## 2020-09-09 MED ORDER — ONDANSETRON 4 MG PO TBDP: ORAL_TABLET | ORAL | 11 refills | Status: DC

## 2020-09-09 MED ORDER — BACLOFEN 10 MG PO TABS: 10.0000 mg | ORAL_TABLET | Freq: Three times a day (TID) | ORAL | 3 refills | Status: DC

## 2020-09-09 NOTE — Telephone Encounter (Signed)
Botox charge sheet completed and is pending MD signature. Dx code: G67.709.

## 2020-09-09 NOTE — Telephone Encounter (Signed)
Can you start the approval process for patient for botox for migraine please? She is medicare and secondary medicaid - can we do B&B? Put her on my schedule please for botox. You can add her on as a 4pm one day. Thanks. G43.709

## 2020-09-10 LAB — LAMOTRIGINE LEVEL: Lamotrigine Lvl: 8.1 ug/mL (ref 2.0–20.0)

## 2020-09-14 ENCOUNTER — Other Ambulatory Visit (HOSPITAL_COMMUNITY): Payer: Self-pay

## 2020-09-14 NOTE — Telephone Encounter (Signed)
Received charge sheet for Botox. I will have to check with her insurance because [employer] usually requires use of specialty pharmacy.

## 2020-09-15 MED ORDER — BOTOX 200 UNITS IJ SOLR: INTRAMUSCULAR | 1 refills | Status: DC

## 2020-09-15 NOTE — Telephone Encounter (Signed)
I called [employer] and spoke with Francis Dowse to check requirements for [phone] and [phone]. Francis Dowse states codes are valid and billable and do not require PA. Reference # [id number].  Patient will need to use [facility] per [facility]. How do you want to proceed?

## 2020-09-15 NOTE — Telephone Encounter (Signed)
I called patient and we scheduled her first appointment for 5/4 @ 4:00. I went over how the specialty pharmacy works; advised that prescription was sent in today and will take a few days to process. I let her know that I will check the status with them either later this week or early next week. Patient verbalized understanding.

## 2020-09-15 NOTE — Addendum Note (Signed)
Addended by: Bertram Savin on: 09/15/2020 11:44 AM   Modules accepted: Orders

## 2020-09-15 NOTE — Telephone Encounter (Signed)
Dr Lucia Gaskins said that's fine please proceed with [facility]. I will send RX now.

## 2020-09-16 ENCOUNTER — Other Ambulatory Visit (HOSPITAL_COMMUNITY): Payer: Self-pay

## 2020-09-16 MED FILL — Bictegravir-Emtricitabine-Tenofovir AF Tab 50-200-25 MG: ORAL | 30 days supply | Qty: 30 | Fill #0 | Status: AC

## 2020-09-16 NOTE — Telephone Encounter (Signed)
Received request for PA completion from [facility]. Completed EPA via CMM. Key #[id number].

## 2020-09-16 NOTE — Telephone Encounter (Signed)
Received approval from [facility]. [id number] (09/16/20- 12/16/20).

## 2020-09-21 ENCOUNTER — Other Ambulatory Visit (HOSPITAL_COMMUNITY): Payer: Self-pay

## 2020-09-21 NOTE — Telephone Encounter (Signed)
I called [facility] and spoke with Jasmine December to check status of patient's Botox order. Jasmine December states Botox TBD 4/28.

## 2020-09-24 NOTE — Telephone Encounter (Signed)
Received (1) 200 unit vial of Botox today from [facility]. 

## 2020-09-30 ENCOUNTER — Ambulatory Visit (INDEPENDENT_AMBULATORY_CARE_PROVIDER_SITE_OTHER): Payer: Medicare Other | Admitting: Neurology

## 2020-09-30 DIAGNOSIS — G43709 Chronic migraine without aura, not intractable, without status migrainosus: Secondary | ICD-10-CM | POA: Diagnosis not present

## 2020-09-30 NOTE — Progress Notes (Signed)
Botox consent signed   Botox- 200 units x 1 vial Lot: [id number] Expiration: 05/2023 NDC: 2957-4734-03  Bacteriostatic 0.9% Sodium Chloride- 68mL total Lot: [id number] Expiration: 10/28/2021 NDC: 8381-8403-75  Dx: O36.067 S/P

## 2020-09-30 NOTE — Progress Notes (Signed)
Consent Form Botulism Toxin Injection For Chronic Migraine  09/30/2020: This is our first botox. +a. I did her forehead a little higher to make sure it doesn't become weak and interfere with her sight, she has a heavy brow. We can always change.   Reviewed orally with patient, additionally signature is on file:  Botulism toxin has been approved by the Federal drug administration for treatment of chronic migraine. Botulism toxin does not cure chronic migraine and it may not be effective in some patients.  The administration of botulism toxin is accomplished by injecting a small amount of toxin into the muscles of the neck and head. Dosage must be titrated for each individual. Any benefits resulting from botulism toxin tend to wear off after 3 months with a repeat injection required if benefit is to be maintained. Injections are usually done every 3-4 months with maximum effect peak achieved by about 2 or 3 weeks. Botulism toxin is expensive and you should be sure of what costs you will incur resulting from the injection.  The side effects of botulism toxin use for chronic migraine may include:   -Transient, and usually mild, facial weakness with facial injections  -Transient, and usually mild, head or neck weakness with head/neck injections  -Reduction or loss of forehead facial animation due to forehead muscle weakness  -Eyelid drooping  -Dry eye  -Pain at the site of injection or bruising at the site of injection  -Double vision  -Potential unknown long term risks  Contraindications: You should not have Botox if you are pregnant, nursing, allergic to albumin, have an infection, skin condition, or muscle weakness at the site of the injection, or have myasthenia gravis, Lambert-Eaton syndrome, or ALS.  It is also possible that as with any injection, there may be an allergic reaction or no effect from the medication. Reduced effectiveness after repeated injections is sometimes seen and rarely  infection at the injection site may occur. All care will be taken to prevent these side effects. If therapy is given over a long time, atrophy and wasting in the muscle injected may occur. Occasionally the patient's become refractory to treatment because they develop antibodies to the toxin. In this event, therapy needs to be modified.  I have read the above information and consent to the administration of botulism toxin.    BOTOX PROCEDURE NOTE FOR MIGRAINE HEADACHE    Contraindications and precautions discussed with patient(above). Aseptic procedure was observed and patient tolerated procedure. Procedure performed by Dr. Artemio Aly  The condition has existed for more than 6 months, and pt does not have a diagnosis of ALS, Myasthenia Gravis or Lambert-Eaton Syndrome.  Risks and benefits of injections discussed and pt agrees to proceed with the procedure.  Written consent obtained  These injections are medically necessary. Pt  receives good benefits from these injections. These injections do not cause sedations or hallucinations which the oral therapies may cause.  Description of procedure:  The patient was placed in a sitting position. The standard protocol was used for Botox as follows, with 5 units of Botox injected at each site:   -Procerus muscle, midline injection  -Corrugator muscle, bilateral injection  -Frontalis muscle, bilateral injection, with 2 sites each side, medial injection was performed in the upper one third of the frontalis muscle, in the region vertical from the medial inferior edge of the superior orbital rim. The lateral injection was again in the upper one third of the forehead vertically above the lateral limbus of the cornea,  1.5 cm lateral to the medial injection site.  -Temporalis muscle injection, 4 sites, bilaterally. The first injection was 3 cm above the tragus of the ear, second injection site was 1.5 cm to 3 cm up from the first injection site in line with  the tragus of the ear. The third injection site was 1.5-3 cm forward between the first 2 injection sites. The fourth injection site was 1.5 cm posterior to the second injection site.   -Occipitalis muscle injection, 3 sites, bilaterally. The first injection was done one half way between the occipital protuberance and the tip of the mastoid process behind the ear. The second injection site was done lateral and superior to the first, 1 fingerbreadth from the first injection. The third injection site was 1 fingerbreadth superiorly and medially from the first injection site.  -Cervical paraspinal muscle injection, 2 sites, bilateral knee first injection site was 1 cm from the midline of the cervical spine, 3 cm inferior to the lower border of the occipital protuberance. The second injection site was 1.5 cm superiorly and laterally to the first injection site.  -Trapezius muscle injection was performed at 3 sites, bilaterally. The first injection site was in the upper trapezius muscle halfway between the inflection point of the neck, and the acromion. The second injection site was one half way between the acromion and the first injection site. The third injection was done between the first injection site and the inflection point of the neck.   Will return for repeat injection in 3 months.   200 units of Botox was used, any Botox not injected was wasted. The patient tolerated the procedure well, there were no complications of the above procedure.

## 2020-10-12 ENCOUNTER — Other Ambulatory Visit (HOSPITAL_COMMUNITY): Payer: Self-pay

## 2020-10-12 MED FILL — Bictegravir-Emtricitabine-Tenofovir AF Tab 50-200-25 MG: ORAL | 30 days supply | Qty: 30 | Fill #1 | Status: AC

## 2020-10-17 ENCOUNTER — Other Ambulatory Visit (HOSPITAL_COMMUNITY): Payer: Self-pay

## 2020-10-19 ENCOUNTER — Other Ambulatory Visit (HOSPITAL_COMMUNITY): Payer: Self-pay

## 2020-11-09 ENCOUNTER — Other Ambulatory Visit: Payer: Self-pay | Admitting: Family

## 2020-11-09 DIAGNOSIS — B20 Human immunodeficiency virus [HIV] disease: Secondary | ICD-10-CM

## 2020-11-10 ENCOUNTER — Other Ambulatory Visit (HOSPITAL_COMMUNITY): Payer: Self-pay

## 2020-11-16 ENCOUNTER — Other Ambulatory Visit (HOSPITAL_COMMUNITY): Payer: Self-pay

## 2020-11-18 ENCOUNTER — Telehealth: Payer: Self-pay | Admitting: Neurology

## 2020-11-18 NOTE — Telephone Encounter (Signed)
Received request from [facility] to complete ePA for Botox. Completed ePA via CMM. Received approval. [id number] (11/18/20- 02/18/2021).

## 2020-11-20 ENCOUNTER — Other Ambulatory Visit (HOSPITAL_COMMUNITY): Payer: Self-pay

## 2020-11-20 MED FILL — Bictegravir-Emtricitabine-Tenofovir AF Tab 50-200-25 MG: ORAL | 30 days supply | Qty: 30 | Fill #2 | Status: AC

## 2020-11-24 ENCOUNTER — Other Ambulatory Visit (HOSPITAL_COMMUNITY): Payer: Self-pay

## 2020-12-01 NOTE — Telephone Encounter (Signed)
Received (1) 200 unit vial of Botox today from [facility] for patient's 8/9 appointment.

## 2020-12-09 ENCOUNTER — Other Ambulatory Visit (HOSPITAL_COMMUNITY): Payer: Self-pay

## 2020-12-11 ENCOUNTER — Other Ambulatory Visit (HOSPITAL_COMMUNITY): Payer: Self-pay

## 2020-12-18 ENCOUNTER — Other Ambulatory Visit (HOSPITAL_COMMUNITY): Payer: Self-pay

## 2020-12-18 MED FILL — Bictegravir-Emtricitabine-Tenofovir AF Tab 50-200-25 MG: ORAL | 30 days supply | Qty: 30 | Fill #3 | Status: AC

## 2020-12-24 ENCOUNTER — Other Ambulatory Visit (HOSPITAL_COMMUNITY): Payer: Self-pay

## 2021-01-05 ENCOUNTER — Ambulatory Visit (INDEPENDENT_AMBULATORY_CARE_PROVIDER_SITE_OTHER): Payer: Medicare Other | Admitting: Neurology

## 2021-01-05 ENCOUNTER — Other Ambulatory Visit: Payer: Self-pay

## 2021-01-05 DIAGNOSIS — G43709 Chronic migraine without aura, not intractable, without status migrainosus: Secondary | ICD-10-CM

## 2021-01-05 NOTE — Progress Notes (Signed)
Consent Form Botulism Toxin Injection For Chronic Migraine   01/05/2021: Frontalis injections Very high in the forehead due to low brow, near the hairline. +5 each orb oculi. +10 each masseters. Did great > 50% improvement, started wearing off about 10 weeks.   09/30/2020: This is our first botox. +a. I did her forehead a little higher to make sure it doesn't become weak and interfere with her sight, she has a heavy brow. We can always change.   Reviewed orally with patient, additionally signature is on file:  Botulism toxin has been approved by the Federal drug administration for treatment of chronic migraine. Botulism toxin does not cure chronic migraine and it may not be effective in some patients.  The administration of botulism toxin is accomplished by injecting a small amount of toxin into the muscles of the neck and head. Dosage must be titrated for each individual. Any benefits resulting from botulism toxin tend to wear off after 3 months with a repeat injection required if benefit is to be maintained. Injections are usually done every 3-4 months with maximum effect peak achieved by about 2 or 3 weeks. Botulism toxin is expensive and you should be sure of what costs you will incur resulting from the injection.  The side effects of botulism toxin use for chronic migraine may include:   -Transient, and usually mild, facial weakness with facial injections  -Transient, and usually mild, head or neck weakness with head/neck injections  -Reduction or loss of forehead facial animation due to forehead muscle weakness  -Eyelid drooping  -Dry eye  -Pain at the site of injection or bruising at the site of injection  -Double vision  -Potential unknown long term risks  Contraindications: You should not have Botox if you are pregnant, nursing, allergic to albumin, have an infection, skin condition, or muscle weakness at the site of the injection, or have myasthenia gravis, Lambert-Eaton syndrome, or  ALS.  It is also possible that as with any injection, there may be an allergic reaction or no effect from the medication. Reduced effectiveness after repeated injections is sometimes seen and rarely infection at the injection site may occur. All care will be taken to prevent these side effects. If therapy is given over a long time, atrophy and wasting in the muscle injected may occur. Occasionally the patient's become refractory to treatment because they develop antibodies to the toxin. In this event, therapy needs to be modified.  I have read the above information and consent to the administration of botulism toxin.    BOTOX PROCEDURE NOTE FOR MIGRAINE HEADACHE    Contraindications and precautions discussed with patient(above). Aseptic procedure was observed and patient tolerated procedure. Procedure performed by Dr. Artemio Aly  The condition has existed for more than 6 months, and pt does not have a diagnosis of ALS, Myasthenia Gravis or Lambert-Eaton Syndrome.  Risks and benefits of injections discussed and pt agrees to proceed with the procedure.  Written consent obtained  These injections are medically necessary. Pt  receives good benefits from these injections. These injections do not cause sedations or hallucinations which the oral therapies may cause.  Description of procedure:  The patient was placed in a sitting position. The standard protocol was used for Botox as follows, with 5 units of Botox injected at each site:   -Procerus muscle, midline injection  -Corrugator muscle, bilateral injection  -Frontalis muscle, bilateral injection, with 2 sites each side, medial injection was performed in the upper one third of the frontalis  muscle, in the region vertical from the medial inferior edge of the superior orbital rim. The lateral injection was again in the upper one third of the forehead vertically above the lateral limbus of the cornea, 1.5 cm lateral to the medial injection  site.  -Temporalis muscle injection, 4 sites, bilaterally. The first injection was 3 cm above the tragus of the ear, second injection site was 1.5 cm to 3 cm up from the first injection site in line with the tragus of the ear. The third injection site was 1.5-3 cm forward between the first 2 injection sites. The fourth injection site was 1.5 cm posterior to the second injection site.   -Occipitalis muscle injection, 3 sites, bilaterally. The first injection was done one half way between the occipital protuberance and the tip of the mastoid process behind the ear. The second injection site was done lateral and superior to the first, 1 fingerbreadth from the first injection. The third injection site was 1 fingerbreadth superiorly and medially from the first injection site.  -Cervical paraspinal muscle injection, 2 sites, bilateral knee first injection site was 1 cm from the midline of the cervical spine, 3 cm inferior to the lower border of the occipital protuberance. The second injection site was 1.5 cm superiorly and laterally to the first injection site.  -Trapezius muscle injection was performed at 3 sites, bilaterally. The first injection site was in the upper trapezius muscle halfway between the inflection point of the neck, and the acromion. The second injection site was one half way between the acromion and the first injection site. The third injection was done between the first injection site and the inflection point of the neck.   Will return for repeat injection in 3 months.   200 units of Botox was used, any Botox not injected was wasted. The patient tolerated the procedure well, there were no complications of the above procedure.

## 2021-01-05 NOTE — Progress Notes (Signed)
Botox- 200 units x 1 vial Lot: [id number] Expiration: 05/2023 NDC: 0023-3921-02  Bacteriostatic 0.9% Sodium Chloride- 4mL total Lot: [id number] Expiration: 05/30/2022 NDC: 0409-1966-02  Dx: G43.709 S/P  

## 2021-01-25 ENCOUNTER — Other Ambulatory Visit (HOSPITAL_COMMUNITY): Payer: Self-pay

## 2021-01-29 ENCOUNTER — Other Ambulatory Visit (HOSPITAL_COMMUNITY): Payer: Self-pay

## 2021-01-29 MED FILL — Bictegravir-Emtricitabine-Tenofovir AF Tab 50-200-25 MG: ORAL | 30 days supply | Qty: 30 | Fill #4 | Status: AC

## 2021-02-02 ENCOUNTER — Other Ambulatory Visit: Payer: Self-pay

## 2021-02-02 ENCOUNTER — Other Ambulatory Visit: Payer: Medicare Other

## 2021-02-02 ENCOUNTER — Other Ambulatory Visit: Payer: Self-pay | Admitting: Infectious Disease

## 2021-02-02 DIAGNOSIS — F17200 Nicotine dependence, unspecified, uncomplicated: Secondary | ICD-10-CM

## 2021-02-02 DIAGNOSIS — K311 Adult hypertrophic pyloric stenosis: Secondary | ICD-10-CM

## 2021-02-02 DIAGNOSIS — B20 Human immunodeficiency virus [HIV] disease: Secondary | ICD-10-CM

## 2021-02-02 DIAGNOSIS — R112 Nausea with vomiting, unspecified: Secondary | ICD-10-CM

## 2021-02-02 DIAGNOSIS — F1991 Other psychoactive substance use, unspecified, in remission: Secondary | ICD-10-CM

## 2021-02-02 DIAGNOSIS — Z87898 Personal history of other specified conditions: Secondary | ICD-10-CM

## 2021-02-03 LAB — T-HELPER CELL (CD4) - (RCID CLINIC ONLY)
CD4 % Helper T Cell: 46 % (ref 33–65)
CD4 T Cell Abs: 803 /uL (ref 400–1790)

## 2021-02-04 LAB — COMPLETE METABOLIC PANEL WITH GFR
AG Ratio: 1.7 (calc) (ref 1.0–2.5)
ALT: 8 U/L (ref 6–29)
AST: 13 U/L (ref 10–30)
Albumin: 4.3 g/dL (ref 3.6–5.1)
Alkaline phosphatase (APISO): 80 U/L (ref 31–125)
BUN: 11 mg/dL (ref 7–25)
CO2: 32 mmol/L (ref 20–32)
Calcium: 9.4 mg/dL (ref 8.6–10.2)
Chloride: 105 mmol/L (ref 98–110)
Creat: 0.69 mg/dL (ref 0.50–0.99)
Globulin: 2.6 g/dL (calc) (ref 1.9–3.7)
Glucose, Bld: 109 mg/dL — ABNORMAL HIGH (ref 65–99)
Potassium: 4 mmol/L (ref 3.5–5.3)
Sodium: 143 mmol/L (ref 135–146)
Total Bilirubin: 0.4 mg/dL (ref 0.2–1.2)
Total Protein: 6.9 g/dL (ref 6.1–8.1)
eGFR: 112 mL/min/{1.73_m2} (ref >=60)

## 2021-02-04 LAB — CBC WITH DIFFERENTIAL/PLATELET
Absolute Monocytes: 335 cells/uL (ref 200–950)
Basophils Absolute: 31 cells/uL (ref 0–200)
Basophils Relative: 0.5 %
Eosinophils Absolute: 143 cells/uL (ref 15–500)
Eosinophils Relative: 2.3 %
HCT: 40.1 % (ref 35.0–45.0)
Hemoglobin: 13.1 g/dL (ref 11.7–15.5)
Lymphs Abs: 1872 cells/uL (ref 850–3900)
MCH: 31.6 pg (ref 27.0–33.0)
MCHC: 32.7 g/dL (ref 32.0–36.0)
MCV: 96.9 fL (ref 80.0–100.0)
MPV: 9.2 fL (ref 7.5–12.5)
Monocytes Relative: 5.4 %
Neutro Abs: 3819 cells/uL (ref 1500–7800)
Neutrophils Relative %: 61.6 %
Platelets: 284 10*3/uL (ref 140–400)
RBC: 4.14 10*6/uL (ref 3.80–5.10)
RDW: 11.9 % (ref 11.0–15.0)
Total Lymphocyte: 30.2 %
WBC: 6.2 10*3/uL (ref 3.8–10.8)

## 2021-02-04 LAB — LIPID PANEL
Cholesterol: 173 mg/dL (ref <200)
HDL: 53 mg/dL (ref >=50)
LDL Cholesterol (Calc): 102 mg/dL (calc) — ABNORMAL HIGH
Non-HDL Cholesterol (Calc): 120 mg/dL (calc) (ref <130)
Total CHOL/HDL Ratio: 3.3 (calc) (ref <5.0)
Triglycerides: 88 mg/dL (ref <150)

## 2021-02-04 LAB — HIV-1 RNA QUANT-NO REFLEX-BLD
HIV 1 RNA Quant: <20 Copies/mL — ABNORMAL HIGH
HIV-1 RNA Quant, Log: <1.3 Log cps/mL — ABNORMAL HIGH

## 2021-02-04 LAB — RPR: RPR Ser Ql: NONREACTIVE

## 2021-02-14 ENCOUNTER — Other Ambulatory Visit: Payer: Self-pay | Admitting: Neurology

## 2021-02-16 ENCOUNTER — Other Ambulatory Visit: Payer: Self-pay

## 2021-02-16 ENCOUNTER — Other Ambulatory Visit (HOSPITAL_COMMUNITY): Payer: Self-pay

## 2021-02-16 ENCOUNTER — Ambulatory Visit (INDEPENDENT_AMBULATORY_CARE_PROVIDER_SITE_OTHER): Payer: Medicare Other | Admitting: Infectious Disease

## 2021-02-16 ENCOUNTER — Encounter: Payer: Self-pay | Admitting: Infectious Disease

## 2021-02-16 VITALS — Wt 99.0 lb

## 2021-02-16 DIAGNOSIS — L989 Disorder of the skin and subcutaneous tissue, unspecified: Secondary | ICD-10-CM

## 2021-02-16 DIAGNOSIS — B20 Human immunodeficiency virus [HIV] disease: Secondary | ICD-10-CM

## 2021-02-16 DIAGNOSIS — R569 Unspecified convulsions: Secondary | ICD-10-CM

## 2021-02-16 DIAGNOSIS — G8929 Other chronic pain: Secondary | ICD-10-CM

## 2021-02-16 DIAGNOSIS — R519 Headache, unspecified: Secondary | ICD-10-CM

## 2021-02-16 DIAGNOSIS — R195 Other fecal abnormalities: Secondary | ICD-10-CM

## 2021-02-16 DIAGNOSIS — G43009 Migraine without aura, not intractable, without status migrainosus: Secondary | ICD-10-CM

## 2021-02-16 DIAGNOSIS — Z87898 Personal history of other specified conditions: Secondary | ICD-10-CM

## 2021-02-16 DIAGNOSIS — F1991 Other psychoactive substance use, unspecified, in remission: Secondary | ICD-10-CM

## 2021-02-16 HISTORY — DX: Other fecal abnormalities: R19.5

## 2021-02-16 HISTORY — DX: Disorder of the skin and subcutaneous tissue, unspecified: L98.9

## 2021-02-16 MED ORDER — BICTEGRAVIR-EMTRICITAB-TENOFOV 50-200-25 MG PO TABS
1.0000 | ORAL_TABLET | Freq: Every day | ORAL | 11 refills | Status: DC
  Filled 2021-02-16 – 2021-02-24 (×2): qty 30, 30d supply, fill #0
  Filled 2021-04-09: qty 30, 30d supply, fill #1
  Filled 2021-05-05: qty 30, 30d supply, fill #2
  Filled 2021-06-10: qty 30, 30d supply, fill #3
  Filled 2021-07-29: qty 30, 30d supply, fill #4
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
## 2021-02-18 ENCOUNTER — Telehealth: Payer: Self-pay | Admitting: Neurology

## 2021-02-18 NOTE — Telephone Encounter (Signed)
Patient's next Botox injection is 11/16. Today I received (1) 200 unit vial of Botox from [facility].

## 2021-02-19 LAB — OVA AND PARASITE EXAMINATION
CONCENTRATE RESULT:: NONE SEEN
MICRO NUMBER:: 12398298
SPECIMEN QUALITY:: ADEQUATE
TRICHROME RESULT:: NONE SEEN

## 2021-02-24 ENCOUNTER — Other Ambulatory Visit (HOSPITAL_COMMUNITY): Payer: Self-pay

## 2021-03-01 ENCOUNTER — Other Ambulatory Visit (HOSPITAL_COMMUNITY): Payer: Self-pay

## 2021-03-05 ENCOUNTER — Encounter: Payer: Self-pay | Admitting: Internal Medicine

## 2021-03-08 ENCOUNTER — Telehealth: Payer: Self-pay | Admitting: Neurology

## 2021-03-08 ENCOUNTER — Emergency Department (HOSPITAL_COMMUNITY): Payer: Medicare Other

## 2021-03-08 ENCOUNTER — Emergency Department (HOSPITAL_COMMUNITY)
Admission: EM | Admit: 2021-03-08 | Discharge: 2021-03-08 | Discharge status: Against Medical Advice | Payer: Medicare Other | Attending: Emergency Medicine | Admitting: Emergency Medicine

## 2021-03-08 ENCOUNTER — Encounter (HOSPITAL_COMMUNITY): Payer: Self-pay | Admitting: Emergency Medicine

## 2021-03-08 ENCOUNTER — Ambulatory Visit (INDEPENDENT_AMBULATORY_CARE_PROVIDER_SITE_OTHER): Payer: Medicare Other | Admitting: Neurology

## 2021-03-08 ENCOUNTER — Encounter: Payer: Self-pay | Admitting: Neurology

## 2021-03-08 VITALS — BP 152/94 | HR 89 | Ht 66.0 in | Wt 103.6 lb

## 2021-03-08 DIAGNOSIS — Z21 Asymptomatic human immunodeficiency virus [HIV] infection status: Secondary | ICD-10-CM | POA: Insufficient documentation

## 2021-03-08 DIAGNOSIS — F17210 Nicotine dependence, cigarettes, uncomplicated: Secondary | ICD-10-CM | POA: Insufficient documentation

## 2021-03-08 DIAGNOSIS — H5460 Unqualified visual loss, one eye, unspecified: Secondary | ICD-10-CM

## 2021-03-08 DIAGNOSIS — R443 Hallucinations, unspecified: Secondary | ICD-10-CM

## 2021-03-08 DIAGNOSIS — G518 Other disorders of facial nerve: Secondary | ICD-10-CM | POA: Diagnosis not present

## 2021-03-08 DIAGNOSIS — R29810 Facial weakness: Secondary | ICD-10-CM

## 2021-03-08 DIAGNOSIS — R51 Headache with orthostatic component, not elsewhere classified: Secondary | ICD-10-CM

## 2021-03-08 DIAGNOSIS — R471 Dysarthria and anarthria: Secondary | ICD-10-CM | POA: Diagnosis not present

## 2021-03-08 DIAGNOSIS — H538 Other visual disturbances: Secondary | ICD-10-CM | POA: Insufficient documentation

## 2021-03-08 DIAGNOSIS — H5712 Ocular pain, left eye: Secondary | ICD-10-CM

## 2021-03-08 DIAGNOSIS — R29818 Other symptoms and signs involving the nervous system: Secondary | ICD-10-CM

## 2021-03-08 DIAGNOSIS — J449 Chronic obstructive pulmonary disease, unspecified: Secondary | ICD-10-CM | POA: Diagnosis not present

## 2021-03-08 DIAGNOSIS — Z79899 Other long term (current) drug therapy: Secondary | ICD-10-CM | POA: Insufficient documentation

## 2021-03-08 DIAGNOSIS — Z5329 Procedure and treatment not carried out because of patient's decision for other reasons: Secondary | ICD-10-CM

## 2021-03-08 DIAGNOSIS — B20 Human immunodeficiency virus [HIV] disease: Secondary | ICD-10-CM

## 2021-03-08 DIAGNOSIS — R4701 Aphasia: Secondary | ICD-10-CM | POA: Diagnosis present

## 2021-03-08 DIAGNOSIS — H02401 Unspecified ptosis of right eyelid: Secondary | ICD-10-CM

## 2021-03-08 DIAGNOSIS — G510 Bell's palsy: Secondary | ICD-10-CM

## 2021-03-08 LAB — COMPREHENSIVE METABOLIC PANEL
ALT: 15 U/L (ref 0–44)
AST: 16 U/L (ref 15–41)
Albumin: 4.4 g/dL (ref 3.5–5.0)
Alkaline Phosphatase: 73 U/L (ref 38–126)
Anion gap: 6 (ref 5–15)
BUN: 9 mg/dL (ref 6–20)
CO2: 31 mmol/L (ref 22–32)
Calcium: 9.3 mg/dL (ref 8.9–10.3)
Chloride: 102 mmol/L (ref 98–111)
Creatinine, Ser: 0.64 mg/dL (ref 0.44–1.00)
GFR, Estimated: >60 mL/min (ref >60)
Glucose, Bld: 102 mg/dL — ABNORMAL HIGH (ref 70–99)
Potassium: 3.5 mmol/L (ref 3.5–5.1)
Sodium: 139 mmol/L (ref 135–145)
Total Bilirubin: 0.5 mg/dL (ref 0.3–1.2)
Total Protein: 7.4 g/dL (ref 6.5–8.1)

## 2021-03-08 LAB — ACETAMINOPHEN LEVEL: Acetaminophen (Tylenol), Serum: <10 ug/mL — ABNORMAL LOW (ref 10–30)

## 2021-03-08 LAB — CBC WITH DIFFERENTIAL/PLATELET
Abs Immature Granulocytes: 0.02 10*3/uL (ref 0.00–0.07)
Basophils Absolute: 0 10*3/uL (ref 0.0–0.1)
Basophils Relative: 1 %
Eosinophils Absolute: 0.2 10*3/uL (ref 0.0–0.5)
Eosinophils Relative: 3 %
HCT: 42.4 % (ref 36.0–46.0)
Hemoglobin: 13.6 g/dL (ref 12.0–15.0)
Immature Granulocytes: 0 %
Lymphocytes Relative: 38 %
Lymphs Abs: 2.2 10*3/uL (ref 0.7–4.0)
MCH: 31.9 pg (ref 26.0–34.0)
MCHC: 32.1 g/dL (ref 30.0–36.0)
MCV: 99.3 fL (ref 80.0–100.0)
Monocytes Absolute: 0.5 10*3/uL (ref 0.1–1.0)
Monocytes Relative: 9 %
Neutro Abs: 2.9 10*3/uL (ref 1.7–7.7)
Neutrophils Relative %: 49 %
Platelets: 219 10*3/uL (ref 150–400)
RBC: 4.27 MIL/uL (ref 3.87–5.11)
RDW: 11.9 % (ref 11.5–15.5)
WBC: 5.9 10*3/uL (ref 4.0–10.5)
nRBC: 0 % (ref 0.0–0.2)

## 2021-03-08 LAB — ETHANOL: Alcohol, Ethyl (B): <10 mg/dL (ref <10)

## 2021-03-08 LAB — SALICYLATE LEVEL: Salicylate Lvl: <7 mg/dL — ABNORMAL LOW (ref 7.0–30.0)

## 2021-03-08 NOTE — Telephone Encounter (Signed)
I called [facility] spoke to [facility] CN, relayed per Dr. Lucia Gaskins to let them know that pt is delusional, incohernet , see note in Epic more detail. Dr. Lucia Gaskins stated pt needs stat MRI brain and orbits. Will be coming by nonemergent EMS.

## 2021-03-08 NOTE — Telephone Encounter (Signed)
[employer] medicare/medicaid order sent to [facility], [facility] they will reach out to the patient to schedule.  ?

## 2021-03-08 NOTE — Progress Notes (Signed)
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 Toxin Type A (BOTOX) 200 units SOLR INJECT 155 UNITS INTO THE  MUSCLES OF HEAD AND NECK  EVERY 3 MONTHS 1 each 1   buprenorphine (SUBUTEX) 8 MG SUBL SL tablet Place 8 mg under the tongue 3 (three) times daily.      Fremanezumab-vfrm (AJOVY) 225 MG/1.5ML SOAJ Inject 225 mg into the skin every 30 (thirty) days. 1.5 mL 11   gabapentin (NEURONTIN) 300 MG capsule Take 1-2 caps up to 3x a day 270 capsule 4   lamoTRIgine (LAMICTAL) 200 MG tablet Take 1 tablet (200 mg total) by mouth See admin instructions. Take 1 tablet (200 mg totally) combine with 2 tablets 25 mg (50 mg totally) to make 250 mg totally by mouth 2 times daily 180 tablet 3   lamoTRIgine (LAMICTAL) 25 MG tablet TAKE 2 TABLETS BY MOUTH TWICE A DAY .*TAKE WITH OTHER LAMICTAL FOR A TOTAL OF 250 MG TWICE DAILY 360 tablet 2   ondansetron (ZOFRAN-ODT) 4 MG disintegrating tablet DISSOLVE 1 TABLET ON TONGUE EVERY 8 HOURS AS NEEDED FOR NAUSEA 60 tablet 11   rizatriptan (MAXALT) 10 MG tablet Take 1 tablet (10 mg total) by mouth as needed for migraine. May repeat in 2 hours if needed 10  tablet 11   No current facility-administered medications for this visit.    Allergies as of 03/08/2021 - Review Complete 03/08/2021  Allergen Reaction Noted   Naloxone Hives 02/17/2020   Glycopyrrolate Rash 03/06/2015    Vitals: BP (!) 152/94   Pulse 89   Ht 5\' 6"  (1.676 m)   Wt 103 lb 9.6 oz (47 kg)   LMP 01/08/2016   BMI 16.72 kg/m  Last Weight:  Wt Readings from Last 1 Encounters:  03/08/21 103 lb 9.6 oz (47 kg)   Last Height:   Ht Readings from Last 1 Encounters:  03/08/21 5\' 6"  (1.676 m)   Exam: Slightly agitated               Speech:    Speech is fluent but tangential and often incoherent Cognition:    Confused, slightly agitated   Cranial Nerves:    The pupils are pinpoint, slight ptosis of the right eye, incomplete closure of the left eye with bell's phenomenon, mild left nasolabial flattening left but symmetrical when smiling, left eye impaired vision, left facial sensory changes. The palate elevates in the midline. Hearing intact. Voice is normal. Shoulder shrug is normal. The tongue has normal motion without fasciculations.   Coordination:  No dysmetria  Motor Observation: Tremulous Tone:    Normal muscle tone.     Strength:    Strength is V/V in the upper and lower limbs.      Sensation: intact to LT   Assessment/Plan:   40 y.o. female here as a follow up for multiple diagnoses. Although today she is hypomanic and we are concerned, she refused to go toED (see below) RoutineEEG - dysrhythmic theta activity and sharp transients emanating from the left temporal region(confirmed recently at EMU with Dr. Logan Bores who made no changes).  MRI  brain unremarkable 2020 also MRI cervical spine 2016 unremarkable. 3-day ambulatory EEG monitoring - occasional single bursts of sharp epileptiform discharges that were generalized. These were increased in frequency during sleep. Occasional sharp discharges also seen in the right parietal head region.She was on the Lamictal  150mg  twice daily and had a seizure. Increased to 250mg  twice daily now doing well.  Also has migraines, refractory depression, trigeminal neuralgia, meralgia paresthetica, stress/social stressors, HIV well controlled, chronic nausea and failure to thrive(BMI17)   She is tangential and hypomanic and possibly delusional and irritable today, she feels she has a parasite or some infestation and has pictures and videos, she feels no one believes her and keeps stating she is not crazy,  On exam she appears to be having difficulty with eyelid closure on the left with bell's phenomenon(CN 7 palsy?), vision changes in both eyes by report left > right, she is having trouble with speech slight dysarthria and perseveration, Minimal left lower nasolabial flattening when not smiling, she does report weakness left face, more left eye vision loss per report, slight ptosis on the right eye, numbness and tingling hands, tremors, feels bugs are coming out of her.She says she can't remember anything, having some memory problems and cognitive decline, at times slightly  incoherent today, she shows me pictures of something she coughed up looked like round mucus maybe long and cylindrical,no seizures, headaches have been terrible and positional, pin point pupils. She is tremulous.  I did not feel as though it was safe for her to drive, and given some focal neurologic symptoms I told her I did not feel comfortable having her drive that she should go to the emergency room and get a stat MRI of her brain, we called EMS who spent quite a bit of time talking the patient's, trying to convince her to go with him to the emergency room.  she refused she did call her son to come and give her a ride, said she would call 911 later to go to the emergency room.  I informed her primary care because patient said that she was going there at 33.    -I feel patient should be admitted to the emergency room given some stat brain imaging, possibly a  psychiatry work-up inpatient.  We called the emergency room to let them know she was coming but of course then she declined, she appeared to understand the risks of not going to the emergency room and so at that point I had no reason to force her to go to the emergency room and she was not driving she called her son to pick her up..   - ordered MRI brain and orbits. In her immunocompromised state she really should have a thorough neurologic exam including brain and orbits.   PRIOR PLAN:  TMS therapy, [place], intractable depression refractory to medications, anxiety, weight loss, migraines, triued and faile multiple medications even ECT. Also may consider botox, literature is there to say it can help with depression and she has chronic migraines as well.   Seizures: continue Lamictal which is also helpful for her mood disorder. Doing well.stable. Saw Dr, Everlena Cooper, had EMU, say epileptiform activity on EEG, no seizures, no changes to medication. No recent seizures.   Trigeminal neuralgia: Lamictal helps, neurontin or baclofen prn, doing well, stable  Meralgia Paresthetica: monitor clincally, stable no changes, still no episodes since last being seen.  Migraines: triptan and anti-emetic acutely, On CGRPsignificantly improved but wearing off and still has 15  headache days and 8 migraine days a month. and can complicate her GI problems, on ajovy. Continue. gabapentin, lamictal, baclofen, zofrn and riztriptan. Consider Botox: she has chronic migraines, 15 headache days a month and 8 migraine days a month lasting 24 hours moderate to severe, ongoing over a year, has tried everything, no medication overuse, no aura, migraines are unilateral or bilateral, pulsating pounding throbbing, she has lots of nausea, light sensitivity, sound sensitivity, movement makes it worse, refractory.  Stress/Social stressors: tried to encourage patient to see therapist and osychiatrist, she is going through significant  stress in her life. We discussed stress today, a lot of family issues especially with her children's father.   PRIOR:  Still experiencing GI problems, has appointment with specialist soon. Unclear nausea/vomiting and weight loss she is following with multiple specialists, discussed mesenteric insufficiency, also discussed POTs which she brought up, I'm not sure these explain all her symptoms but this is not my area of expertise. Still is very curious, medication related? We screened her for porphyria in the past was neg. She has a large amount of stool in her colon, she has opioid indced constipation tried Movantik which caused side effects. WF/u with GI and Porfirio Oar   Discussed: To prevent or relieve headaches, try the following: Cool Compress. Lie down and place a cool compress on your head.  Avoid headache triggers. If certain foods or odors seem to have triggered your migraines in the past, avoid them. A headache diary might help you identify triggers.  Include physical activity in your daily routine. Try a daily walk or other moderate aerobic exercise.  Manage stress. Find healthy ways to cope with the stressors, such as delegating tasks on your to-do list.  Practice relaxation techniques. Try deep breathing, yoga, massage and visualization.  Eat regularly. Eating regularly scheduled meals and maintaining a healthy diet might help prevent headaches. Also, drink plenty of fluids.  Follow a regular sleep schedule. Sleep deprivation might contribute to headaches Consider biofeedback. With this mind-body technique, you learn to control certain bodily functions -- such as muscle tension, heart rate and blood pressure -- to prevent headaches or reduce headache pain.    Proceed to emergency room if you experience new or worsening symptoms or symptoms do not resolve, if you have new neurologic symptoms or if headache is severe, or for any concerning symptom.   Provided education and  documentation from American headache Society toolbox including articles on: chronic migraine medication overuse headache, chronic migraines, prevention of migraines, behavioral and other nonpharmacologic treatments for headache.    Naomie Dean, MD  [facility] [address] [place], [place] [address]  I spent over 120 minutes of face-to-face and non-face-to-face time with patient on the  1. Bell's palsy   2. 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 Facial droop   4. Progressive neurologic decline   5. Hallucinations   6. Monocular vision loss   7. History of HIV infection (HCC)   8. Ptosis, right   9. Positional headache   10. Eye pain, left     diagnosis.  This included previsit chart review, lab review, study review, order entry, electronic health record documentation, patient education on the different diagnostic and therapeutic options, counseling and coordination of care, risks and benefits of management, compliance, or risk factor reduction

## 2021-03-08 NOTE — Telephone Encounter (Signed)
Please cal the ED: Patient needs stat MRI brain and MRI orbits. She is tangential, incoherent, rambling,   She is teary today, incoherent, showing [place] pictures of "things" coming out of her, tremulous, she feels she has a parasite, she is really noticed worsening over the summer, appears to be having difficulty with eyelid closure on the left with bell's phenomenon, vision changes in both eyes, she is having trouble with speech, very mild weakness left lower face(left NL fold flattening normal when smiles), more left eye vision loss by report from patient, ptosis on the right eye, numbness and tingling hands, tremors, possible hallucinations/delusions,  feels "things" are coming out of her.She can't remember anything, cognitive decline, somewhat incoherent today, she shows me pictures of something she coughed up looke like round mucus maybe long and cylindrical,no seizures, headaches have been terrible and positional, pin point pupils. We don't feel comfortable with her driving, we are calling EMS to go to ED
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
## 2021-03-08 NOTE — ED Triage Notes (Signed)
Per EMS-patient has seen her neurologist and PCP-patient bought in several samples from her skin to PCP today-NP thinks she is manic so they called EMS-saw neurologist today earlier this am for right sided facial droop and twitching-sent her to PCP for possible stroke-states she has a history of trigeminal nerve issue

## 2021-03-08 NOTE — ED Notes (Signed)
Per PCP-states she was seen today for a new facial droop, slurred speech and having trouble finding her words-when she arrived at PCP's office she was lethargic although when PCP was assessing patient she became animated and symptoms resolved-EDP notified

## 2021-03-15 ENCOUNTER — Ambulatory Visit (INDEPENDENT_AMBULATORY_CARE_PROVIDER_SITE_OTHER): Payer: Medicare Other | Admitting: Infectious Disease

## 2021-03-15 ENCOUNTER — Encounter: Payer: Self-pay | Admitting: Infectious Disease

## 2021-03-15 ENCOUNTER — Other Ambulatory Visit: Payer: Self-pay

## 2021-03-15 VITALS — BP 143/87 | HR 101 | Temp 98.0°F | Ht 66.0 in | Wt 105.0 lb

## 2021-03-15 DIAGNOSIS — R195 Other fecal abnormalities: Secondary | ICD-10-CM | POA: Diagnosis not present

## 2021-03-15 DIAGNOSIS — R238 Other skin changes: Secondary | ICD-10-CM

## 2021-03-15 DIAGNOSIS — E0590 Thyrotoxicosis, unspecified without thyrotoxic crisis or storm: Secondary | ICD-10-CM

## 2021-03-15 DIAGNOSIS — F19959 Other psychoactive substance use, unspecified with psychoactive substance-induced psychotic disorder, unspecified: Secondary | ICD-10-CM | POA: Insufficient documentation

## 2021-03-15 DIAGNOSIS — F1111 Opioid abuse, in remission: Secondary | ICD-10-CM

## 2021-03-15 DIAGNOSIS — B20 Human immunodeficiency virus [HIV] disease: Secondary | ICD-10-CM

## 2021-03-15 DIAGNOSIS — G43709 Chronic migraine without aura, not intractable, without status migrainosus: Secondary | ICD-10-CM

## 2021-03-15 DIAGNOSIS — F419 Anxiety disorder, unspecified: Secondary | ICD-10-CM | POA: Diagnosis not present

## 2021-03-15 DIAGNOSIS — G40019 Localization-related (focal) (partial) idiopathic epilepsy and epileptic syndromes with seizures of localized onset, intractable, without status epilepticus: Secondary | ICD-10-CM

## 2021-03-15 DIAGNOSIS — F19950 Other psychoactive substance use, unspecified with psychoactive substance-induced psychotic disorder with delusions: Secondary | ICD-10-CM

## 2021-03-15 DIAGNOSIS — R21 Rash and other nonspecific skin eruption: Secondary | ICD-10-CM

## 2021-03-15 DIAGNOSIS — F1991 Other psychoactive substance use, unspecified, in remission: Secondary | ICD-10-CM

## 2021-03-15 DIAGNOSIS — F32A Depression, unspecified: Secondary | ICD-10-CM

## 2021-03-15 HISTORY — DX: Other skin changes: R23.8
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
## 2021-03-16 LAB — T-HELPER CELLS (CD4) COUNT (NOT AT ARMC)
CD4 % Helper T Cell: 42 % (ref 33–65)
CD4 T Cell Abs: 864 /uL (ref 400–1790)

## 2021-03-19 LAB — DRUG MONITORING, PANEL 7 WITH CONFIRMATION, URINE
6 Acetylmorphine: NEGATIVE ng/mL (ref <10)
Alcohol Metabolites: NEGATIVE ng/mL (ref <500)
Amphetamine: 5913 ng/mL — ABNORMAL HIGH (ref <250)
Amphetamines: POSITIVE ng/mL — AB (ref <500)
Barbiturates: NEGATIVE ng/mL (ref <300)
Benzodiazepines: NEGATIVE ng/mL (ref <100)
Cocaine Metabolite: NEGATIVE ng/mL (ref <150)
Creatinine: 84.7 mg/dL (ref >=20.0)
Marijuana Metabolite: 405 ng/mL — ABNORMAL HIGH (ref <5)
Marijuana Metabolite: POSITIVE ng/mL — AB (ref <20)
Methadone Metabolite: NEGATIVE ng/mL (ref <100)
Methamphetamine: NEGATIVE ng/mL (ref <250)
Opiates: NEGATIVE ng/mL (ref <100)
Oxidant: NEGATIVE ug/mL (ref <200)
Oxycodone: NEGATIVE ng/mL (ref <100)
pH: 7.2 (ref 4.5–9.0)

## 2021-03-19 LAB — MPO/PR-3 (ANCA) ANTIBODIES
Myeloperoxidase Abs: <1 AI
Serine Protease 3: <1 AI

## 2021-03-19 LAB — DM TEMPLATE

## 2021-03-21 ENCOUNTER — Other Ambulatory Visit: Payer: Self-pay

## 2021-03-21 ENCOUNTER — Ambulatory Visit
Admission: RE | Admit: 2021-03-21 | Discharge: 2021-03-21 | Disposition: A | Discharge status: Home / Self Care | Payer: Medicare Other | Source: Ambulatory Visit | Attending: Neurology | Admitting: Neurology

## 2021-03-21 DIAGNOSIS — H5460 Unqualified visual loss, one eye, unspecified: Secondary | ICD-10-CM

## 2021-03-21 DIAGNOSIS — H5712 Ocular pain, left eye: Secondary | ICD-10-CM

## 2021-03-21 DIAGNOSIS — G510 Bell's palsy: Secondary | ICD-10-CM | POA: Diagnosis not present

## 2021-03-21 DIAGNOSIS — G518 Other disorders of facial nerve: Secondary | ICD-10-CM

## 2021-03-21 DIAGNOSIS — R443 Hallucinations, unspecified: Secondary | ICD-10-CM | POA: Diagnosis not present

## 2021-03-21 DIAGNOSIS — R51 Headache with orthostatic component, not elsewhere classified: Secondary | ICD-10-CM | POA: Diagnosis not present

## 2021-03-21 DIAGNOSIS — B20 Human immunodeficiency virus [HIV] disease: Secondary | ICD-10-CM

## 2021-03-21 DIAGNOSIS — R29810 Facial weakness: Secondary | ICD-10-CM

## 2021-03-21 DIAGNOSIS — R29818 Other symptoms and signs involving the nervous system: Secondary | ICD-10-CM

## 2021-03-21 DIAGNOSIS — H02401 Unspecified ptosis of right eyelid: Secondary | ICD-10-CM

## 2021-03-21 MED ORDER — GADOBENATE DIMEGLUMINE 529 MG/ML IV SOLN
9.0000 mL | Freq: Once | INTRAVENOUS | Status: AC | PRN
  Administered 2021-03-21: 9 mL via INTRAVENOUS

## 2021-03-24 ENCOUNTER — Other Ambulatory Visit (HOSPITAL_COMMUNITY): Payer: Self-pay

## 2021-03-26 ENCOUNTER — Other Ambulatory Visit (HOSPITAL_COMMUNITY): Payer: Self-pay

## 2021-04-08 ENCOUNTER — Telehealth: Payer: Self-pay | Admitting: Neurology

## 2021-04-08 NOTE — Telephone Encounter (Signed)
Patient has Botox appointment 11/16 with Amy. I completed ePA for Botox via [employer]'s web portal. Dx: [phone]. Request was approved. PA #[id number] (04/08/21- 04/08/22).  Patient's PA with [employer] via CMM expired 9/22. I completed ePA. Request was approved. [id number] (04/08/21- 07/09/21).  Botox for this appointment arrived from [facility] 9/22.

## 2021-04-09 ENCOUNTER — Other Ambulatory Visit (HOSPITAL_COMMUNITY): Payer: Self-pay

## 2021-04-13 ENCOUNTER — Ambulatory Visit: Payer: Medicare Other | Admitting: Neurology

## 2021-04-14 ENCOUNTER — Ambulatory Visit: Payer: Medicare Other | Admitting: Family Medicine

## 2021-04-17 LAB — HEPATITIS C RNA QUANTITATIVE
HCV Quantitative Log: <1.18 log IU/mL
HCV RNA, PCR, QN: <15 IU/mL

## 2021-04-17 LAB — CBC WITH DIFFERENTIAL/PLATELET
Absolute Monocytes: 422 cells/uL (ref 200–950)
Basophils Absolute: 37 cells/uL (ref 0–200)
Basophils Relative: 0.6 %
Eosinophils Absolute: 149 cells/uL (ref 15–500)
Eosinophils Relative: 2.4 %
HCT: 38.7 % (ref 35.0–45.0)
Hemoglobin: 13.1 g/dL (ref 11.7–15.5)
Lymphs Abs: 1996 cells/uL (ref 850–3900)
MCH: 32.2 pg (ref 27.0–33.0)
MCHC: 33.9 g/dL (ref 32.0–36.0)
MCV: 95.1 fL (ref 80.0–100.0)
MPV: 9.1 fL (ref 7.5–12.5)
Monocytes Relative: 6.8 %
Neutro Abs: 3596 cells/uL (ref 1500–7800)
Neutrophils Relative %: 58 %
Platelets: 223 10*3/uL (ref 140–400)
RBC: 4.07 10*6/uL (ref 3.80–5.10)
RDW: 12 % (ref 11.0–15.0)
Total Lymphocyte: 32.2 %
WBC: 6.2 10*3/uL (ref 3.8–10.8)

## 2021-04-17 LAB — SEDIMENTATION RATE: Sed Rate: 9 mm/h (ref 0–20)

## 2021-04-17 LAB — SJOGRENS SYNDROME-A EXTRACTABLE NUCLEAR ANTIBODY: SSA (Ro) (ENA) Antibody, IgG: <1 AI

## 2021-04-17 LAB — COMPLETE METABOLIC PANEL WITH GFR
AG Ratio: 2 (calc) (ref 1.0–2.5)
ALT: 9 U/L (ref 6–29)
AST: 12 U/L (ref 10–30)
Albumin: 4.4 g/dL (ref 3.6–5.1)
Alkaline phosphatase (APISO): 71 U/L (ref 31–125)
BUN: 11 mg/dL (ref 7–25)
CO2: 27 mmol/L (ref 20–32)
Calcium: 9.3 mg/dL (ref 8.6–10.2)
Chloride: 105 mmol/L (ref 98–110)
Creat: 0.66 mg/dL (ref 0.50–0.99)
Globulin: 2.2 g/dL (calc) (ref 1.9–3.7)
Glucose, Bld: 109 mg/dL — ABNORMAL HIGH (ref 65–99)
Potassium: 4.2 mmol/L (ref 3.5–5.3)
Sodium: 140 mmol/L (ref 135–146)
Total Bilirubin: 0.4 mg/dL (ref 0.2–1.2)
Total Protein: 6.6 g/dL (ref 6.1–8.1)
eGFR: 113 mL/min/{1.73_m2} (ref >=60)

## 2021-04-17 LAB — CK: Total CK: 56 U/L (ref 29–143)

## 2021-04-17 LAB — HEPATITIS B SURFACE ANTIGEN: Hepatitis B Surface Ag: NONREACTIVE

## 2021-04-17 LAB — RPR: RPR Ser Ql: NONREACTIVE

## 2021-04-17 LAB — TSH+FREE T4: TSH W/REFLEX TO FT4: 2.47 mIU/L

## 2021-04-17 LAB — HEPATITIS C ANTIBODY
Hepatitis C Ab: NONREACTIVE
SIGNAL TO CUT-OFF: 0.01 (ref <1.00)

## 2021-04-17 LAB — C-REACTIVE PROTEIN: CRP: 0.3 mg/L (ref <8.0)

## 2021-04-17 LAB — STRONGYLOIDES ANTIBODY: Strongyloides IgG Antibody, ELISA: NEGATIVE

## 2021-04-17 LAB — HIV-1 RNA QUANT-NO REFLEX-BLD
HIV 1 RNA Quant: NOT DETECTED Copies/mL
HIV-1 RNA Quant, Log: NOT DETECTED Log cps/mL

## 2021-04-17 LAB — SJOGRENS SYNDROME-B EXTRACTABLE NUCLEAR ANTIBODY: SSB (La) (ENA) Antibody, IgG: <1 AI

## 2021-04-25 ENCOUNTER — Other Ambulatory Visit: Payer: Self-pay | Admitting: Neurology

## 2021-04-26 ENCOUNTER — Telehealth: Payer: Self-pay | Admitting: Neurology

## 2021-04-26 NOTE — Telephone Encounter (Signed)
[name] refuses to be with anyone but me for botox. Can you reschedule her? Advise her that all the NPs provide the same procedure, she can ait for me but I may be booked out for months. If she wants to wait that is fine thanks

## 2021-04-27 ENCOUNTER — Telehealth: Payer: Self-pay | Admitting: Neurology

## 2021-04-27 NOTE — Telephone Encounter (Signed)
Can you call this patient and schedule her botox with an NP? She has agreed to see an NP. thanks

## 2021-05-03 ENCOUNTER — Other Ambulatory Visit (HOSPITAL_COMMUNITY): Payer: Self-pay

## 2021-05-05 ENCOUNTER — Other Ambulatory Visit (HOSPITAL_COMMUNITY): Payer: Self-pay

## 2021-05-13 ENCOUNTER — Ambulatory Visit (INDEPENDENT_AMBULATORY_CARE_PROVIDER_SITE_OTHER): Payer: Medicare Other | Admitting: Adult Health

## 2021-05-13 DIAGNOSIS — G43709 Chronic migraine without aura, not intractable, without status migrainosus: Secondary | ICD-10-CM

## 2021-05-13 NOTE — Progress Notes (Signed)
05/13/21: Reports Botox works well for her.  Injected higher on the forehead.    09/30/2020: This is our first botox. +a. I did her forehead a little higher to make sure it doesn't become weak and interfere with her sight, she has a heavy brow. We can always change.   BOTOX PROCEDURE NOTE FOR MIGRAINE HEADACHE    Contraindications and precautions discussed with patient(above). Aseptic procedure was observed and patient tolerated procedure. Procedure performed by Butch Penny, NP  The condition has existed for more than 6 months, and pt does not have a diagnosis of ALS, Myasthenia Gravis or Lambert-Eaton Syndrome.  Risks and benefits of injections discussed and pt agrees to proceed with the procedure.  Written consent obtained   Indication/Diagnosis: chronic migraine BOTOX(J0585) injection was performed according to protocol by [employer]. 200 units of BOTOX was dissolved into 4 cc NS.   NDC: [address]-63  Type of toxin: Botox  Botox- 200 units x 1 vial Lot: [id number] Expiration: 07/2023 NDC: 3235-5732-20   Bacteriostatic 0.9% Sodium Chloride- 23mL total Lot: [id number] Expiration: 05/30/2022 NDC: 6237-6283-15   Dx: V76.160   Description of procedure:  The patient was placed in a sitting position. The standard protocol was used for Botox as follows, with 5 units of Botox injected at each site:   -Procerus muscle, midline injection  -Corrugator muscle, bilateral injection  -Frontalis muscle, bilateral injection, with 2 sites each side, medial injection was performed in the upper one third of the frontalis muscle, in the region vertical from the medial inferior edge of the superior orbital rim. The lateral injection was again in the upper one third of the forehead vertically above the lateral limbus of the cornea, 1.5 cm lateral to the medial injection site.  -Temporalis muscle injection, 4 sites, bilaterally. The first injection was 3 cm above the tragus of the ear, second  injection site was 1.5 cm to 3 cm up from the first injection site in line with the tragus of the ear. The third injection site was 1.5-3 cm forward between the first 2 injection sites. The fourth injection site was 1.5 cm posterior to the second injection site.  -Occipitalis muscle injection, 3 sites, bilaterally. The first injection was done one half way between the occipital protuberance and the tip of the mastoid process behind the ear. The second injection site was done lateral and superior to the first, 1 fingerbreadth from the first injection. The third injection site was 1 fingerbreadth superiorly and medially from the first injection site.  -Cervical paraspinal muscle injection, 2 sites, bilateral knee first injection site was 1 cm from the midline of the cervical spine, 3 cm inferior to the lower border of the occipital protuberance. The second injection site was 1.5 cm superiorly and laterally to the first injection site.  -Trapezius muscle injection was performed at 3 sites, bilaterally. The first injection site was in the upper trapezius muscle halfway between the inflection point of the neck, and the acromion. The second injection site was one half way between the acromion and the first injection site. The third injection was done between the first injection site and the inflection point of the neck.   Will return for repeat injection in 3 months.   A 200 unit sof Botox was used, 155 units were injected, the rest of the Botox was wasted. The patient tolerated the procedure well, there were no complications of the above procedure.  Butch Penny, MSN, NP-C 05/13/2021, 11:58 AM [name] [phone]  [address] [place], [place] [address] [phone]

## 2021-05-13 NOTE — Progress Notes (Signed)
Botox- 200 units x 1 vial Lot: [id number] Expiration: 07/2023 NDC: 7591-6384-66  Bacteriostatic 0.9% Sodium Chloride- 47mL total Lot: [id number] Expiration: 05/30/2022 NDC: 0177-9390-30  Dx: S92.330 S/P

## 2021-05-27 ENCOUNTER — Other Ambulatory Visit (HOSPITAL_COMMUNITY): Payer: Self-pay

## 2021-06-04 ENCOUNTER — Other Ambulatory Visit: Payer: Self-pay | Admitting: Neurology

## 2021-06-04 ENCOUNTER — Other Ambulatory Visit (HOSPITAL_COMMUNITY): Payer: Self-pay

## 2021-06-09 ENCOUNTER — Other Ambulatory Visit: Payer: Self-pay | Admitting: Neurology

## 2021-06-10 ENCOUNTER — Other Ambulatory Visit (HOSPITAL_COMMUNITY): Payer: Self-pay

## 2021-06-29 NOTE — Progress Notes (Signed)
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 Medications  Medication Sig Dispense Refill   albuterol (PROVENTIL HFA;VENTOLIN HFA) 108 (90 Base) MCG/ACT inhaler Inhale 2 puffs into the lungs every 6 (six) hours as needed for wheezing.     amphetamine-dextroamphetamine (ADDERALL) 30 MG tablet Take by mouth.     baclofen (LIORESAL) 10 MG tablet TAKE 1 TABLET BY MOUTH THREE TIMES A DAY 90 tablet 2   bictegravir-emtricitabine-tenofovir AF (BIKTARVY) 50-200-25 MG TABS tablet TAKE 1 TABLET BY MOUTH DAILY. 30 tablet 11   Botulinum Toxin Type A (BOTOX) 200 units SOLR INJECT 155 UNITS INTO THE  MUSCLES OF HEAD AND NECK  EVERY 3 MONTHS 1 each 1   buprenorphine (SUBUTEX) 8 MG SUBL SL tablet Place 8 mg under the tongue 3 (three) times daily.      Fremanezumab-vfrm (AJOVY) 225 MG/1.5ML SOAJ Inject 225 mg into the skin every 30 (thirty) days. 1.5 mL 11   lamoTRIgine (LAMICTAL) 200 MG tablet TAKE 1 TABLET BY MOUTH TWICE A DAY (TAKE WITH TWO 25MG  TABLETS TO MAKE 250MG  TWICE DAILY) 180 tablet 0   lamoTRIgine (LAMICTAL) 25 MG tablet TAKE 2 TABLETS BY MOUTH TWICE A DAY .*TAKE WITH OTHER LAMICTAL FOR A TOTAL OF 250 MG TWICE DAILY 120 tablet 6    ondansetron (ZOFRAN-ODT) 4 MG disintegrating tablet DISSOLVE 1 TABLET ON TONGUE EVERY 8 HOURS AS NEEDED FOR NAUSEA 60 tablet 11   rizatriptan (MAXALT) 10 MG tablet Take 1 tablet (10 mg total) by mouth as needed for migraine. May repeat in 2 hours if needed 10 tablet 11   gabapentin (NEURONTIN) 300 MG capsule Take 3 capsules (900 mg total) by mouth 3 (three) times daily as needed. 270 capsule 11   No current facility-administered medications for this visit.    Allergies as of 06/30/2021 - Review Complete 06/30/2021  Allergen Reaction Noted   Naloxone Hives and Rash 12/04/2019   Galcanezumab-gnlm  05/01/2020   Glycopyrrolate Rash 03/06/2015    Vitals: BP 124/76    Pulse 80    Wt 113 lb (51.3 kg)    LMP 01/08/2016    BMI 18.24 kg/m  Last Weight:  Wt Readings from Last 1 Encounters:  06/30/21 113 lb (51.3 kg)   Last Height:   Ht Readings from Last 1 Encounters:  03/15/21 5\' 6"  (1.676 m)   Exam: NAD, pleasant                  Speech:    Speech is normal; fluent and spontaneous with normal comprehension.  Cognition:    The patient is oriented to person, place, and time;     recent and remote memory intact;     language fluent;    Cranial Nerves:    The pupils are equal, round, and reactive to light.Trigeminal sensation is intact and the muscles of mastication are normal. The face is symmetric. The palate elevates in the midline. Hearing intact. Voice is normal. Shoulder shrug is normal. The tongue has normal motion without fasciculations.   Coordination:  No dysmetria  Motor Observation:    No asymmetry, no atrophy, and no involuntary  movements noted. Tone:    Normal muscle tone.     Strength:    Strength is V/V in the upper and lower limbs.      Sensation: intact to LT     Assessment/Plan:   41 y.o. female here as a follow up for multiple diagnoses:Has migraines, trigeminal neuralgia, meralgia paresthetica, epilepsy  Epilepsy: RoutineEEG showed dysrhythmic theta  activity and sharp transients emanating from the left temporal region(confirmed recently at EMU with Dr. Logan BoresEvans who made no changes).  3-day ambulatory EEG monitoring - occasional single bursts of sharp epileptiform discharges that were generalized. These were increased in frequency during sleep. Occasional sharp discharges also seen in the right parietal head region.She was on the Lamictal 150mg  twice daily and had a breakthrough seizure. Increased to 250mg  twice daily now doing well which is also helpful for her mood disorder.   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 Botox literature is there to say it can help with depression and she has chronic migraines as well.   Trigeminal neuralgia: Lamictal helps, on neurontin and also baclofen prn, increase Gabapentin for worsening of TGN per patient.  Meralgia Paresthetica: monitor clincally, stable/improved   Stress/Social stressors: encouraged patient to see therapist and psychiatrist, she has stress in her life. We discussed stress in the past  Skin lesions: Encouraged her to see another dermatolgist for a biopsy.    Naomie DeanAntonia Dorea Duff, MD  [facility] [address] [place], [place] [id number]  I spent over 30 minutes of face-to-face and non-face-to-face time with patient on the  1. Chronic migraine without aura without status migrainosus, not intractable   2. Trigeminal neuralgia of right side of face   3. Partial idiopathic epilepsy with seizures of localized onset, intractable, without status epilepticus (HCC)   4. Meralgia paresthetica, unspecified laterality    diagnosis.  This included previsit chart review, lab review, study review, order entry,  electronic health record documentation, patient education on the different diagnostic and therapeutic options, counseling and coordination of care, risks and benefits of management, compliance, or risk factor reduction

## 2021-06-30 ENCOUNTER — Other Ambulatory Visit (HOSPITAL_COMMUNITY): Payer: Self-pay

## 2021-06-30 ENCOUNTER — Ambulatory Visit (INDEPENDENT_AMBULATORY_CARE_PROVIDER_SITE_OTHER): Payer: Medicare Other | Admitting: Neurology

## 2021-06-30 ENCOUNTER — Encounter: Payer: Self-pay | Admitting: Neurology

## 2021-06-30 VITALS — BP 124/76 | HR 80 | Wt 113.0 lb

## 2021-06-30 DIAGNOSIS — G40019 Localization-related (focal) (partial) idiopathic epilepsy and epileptic syndromes with seizures of localized onset, intractable, without status epilepticus: Secondary | ICD-10-CM

## 2021-06-30 DIAGNOSIS — G500 Trigeminal neuralgia: Secondary | ICD-10-CM | POA: Diagnosis not present

## 2021-06-30 DIAGNOSIS — G43709 Chronic migraine without aura, not intractable, without status migrainosus: Secondary | ICD-10-CM

## 2021-06-30 DIAGNOSIS — G5710 Meralgia paresthetica, unspecified lower limb: Secondary | ICD-10-CM

## 2021-06-30 MED ORDER — GABAPENTIN 300 MG PO CAPS: 900.0000 mg | ORAL_CAPSULE | Freq: Three times a day (TID) | ORAL | 11 refills | Status: DC | PRN

## 2021-07-06 ENCOUNTER — Other Ambulatory Visit (HOSPITAL_COMMUNITY): Payer: Self-pay

## 2021-07-08 ENCOUNTER — Other Ambulatory Visit (HOSPITAL_COMMUNITY): Payer: Self-pay

## 2021-07-16 ENCOUNTER — Other Ambulatory Visit (HOSPITAL_COMMUNITY): Payer: Self-pay

## 2021-07-19 ENCOUNTER — Other Ambulatory Visit: Payer: Self-pay

## 2021-07-19 DIAGNOSIS — B20 Human immunodeficiency virus [HIV] disease: Secondary | ICD-10-CM

## 2021-07-19 DIAGNOSIS — Z113 Encounter for screening for infections with a predominantly sexual mode of transmission: Secondary | ICD-10-CM

## 2021-07-20 ENCOUNTER — Other Ambulatory Visit: Payer: Medicare Other

## 2021-07-20 ENCOUNTER — Other Ambulatory Visit: Payer: Self-pay

## 2021-07-20 DIAGNOSIS — B20 Human immunodeficiency virus [HIV] disease: Secondary | ICD-10-CM

## 2021-07-20 DIAGNOSIS — F1991 Other psychoactive substance use, unspecified, in remission: Secondary | ICD-10-CM

## 2021-07-20 DIAGNOSIS — R21 Rash and other nonspecific skin eruption: Secondary | ICD-10-CM

## 2021-07-20 DIAGNOSIS — R238 Other skin changes: Secondary | ICD-10-CM

## 2021-07-20 DIAGNOSIS — G40019 Localization-related (focal) (partial) idiopathic epilepsy and epileptic syndromes with seizures of localized onset, intractable, without status epilepticus: Secondary | ICD-10-CM

## 2021-07-22 LAB — CBC WITH DIFFERENTIAL/PLATELET
Absolute Monocytes: 211 cells/uL (ref 200–950)
Basophils Absolute: 40 cells/uL (ref 0–200)
Basophils Relative: 0.9 %
Eosinophils Absolute: 158 cells/uL (ref 15–500)
Eosinophils Relative: 3.6 %
HCT: 40.6 % (ref 35.0–45.0)
Hemoglobin: 13.6 g/dL (ref 11.7–15.5)
Lymphs Abs: 1817 cells/uL (ref 850–3900)
MCH: 31.6 pg (ref 27.0–33.0)
MCHC: 33.5 g/dL (ref 32.0–36.0)
MCV: 94.2 fL (ref 80.0–100.0)
MPV: 9.1 fL (ref 7.5–12.5)
Monocytes Relative: 4.8 %
Neutro Abs: 2174 cells/uL (ref 1500–7800)
Neutrophils Relative %: 49.4 %
Platelets: 234 10*3/uL (ref 140–400)
RBC: 4.31 10*6/uL (ref 3.80–5.10)
RDW: 12.2 % (ref 11.0–15.0)
Total Lymphocyte: 41.3 %
WBC: 4.4 10*3/uL (ref 3.8–10.8)

## 2021-07-22 LAB — T-HELPER CELLS (CD4) COUNT (NOT AT ARMC)
Absolute CD4: 828 cells/uL (ref 490–1740)
CD4 T Helper %: 45 % (ref 30–61)
Total lymphocyte count: 1854 cells/uL (ref 850–3900)

## 2021-07-22 LAB — RPR: RPR Ser Ql: NONREACTIVE

## 2021-07-22 LAB — HIV-1 RNA QUANT-NO REFLEX-BLD
HIV 1 RNA Quant: NOT DETECTED Copies/mL
HIV-1 RNA Quant, Log: NOT DETECTED Log cps/mL

## 2021-07-22 LAB — LACTATE DEHYDROGENASE: LDH: 146 U/L (ref 100–200)

## 2021-07-25 ENCOUNTER — Other Ambulatory Visit: Payer: Self-pay | Admitting: Neurology

## 2021-07-26 ENCOUNTER — Telehealth: Payer: Self-pay | Admitting: Neurology

## 2021-07-26 DIAGNOSIS — G43709 Chronic migraine without aura, not intractable, without status migrainosus: Secondary | ICD-10-CM

## 2021-07-26 MED ORDER — BOTOX 200 UNITS IJ SOLR: INTRAMUSCULAR | 1 refills | Status: DC

## 2021-07-26 NOTE — Telephone Encounter (Signed)
Prescription escribed to [facility] for BOTOX.

## 2021-07-26 NOTE — Telephone Encounter (Signed)
Please send Botox refill to [facility].  °

## 2021-07-29 ENCOUNTER — Other Ambulatory Visit (HOSPITAL_COMMUNITY): Payer: Self-pay

## 2021-08-03 ENCOUNTER — Encounter: Payer: Self-pay | Admitting: Infectious Disease

## 2021-08-03 ENCOUNTER — Ambulatory Visit (INDEPENDENT_AMBULATORY_CARE_PROVIDER_SITE_OTHER): Payer: Medicare Other | Admitting: Infectious Disease

## 2021-08-03 ENCOUNTER — Other Ambulatory Visit (HOSPITAL_COMMUNITY): Payer: Self-pay

## 2021-08-03 ENCOUNTER — Other Ambulatory Visit: Payer: Self-pay

## 2021-08-03 VITALS — BP 132/88 | HR 88 | Temp 98.5°F | Resp 16 | Ht 66.0 in | Wt 111.4 lb

## 2021-08-03 DIAGNOSIS — R238 Other skin changes: Secondary | ICD-10-CM

## 2021-08-03 DIAGNOSIS — B20 Human immunodeficiency virus [HIV] disease: Secondary | ICD-10-CM | POA: Diagnosis not present

## 2021-08-03 DIAGNOSIS — R41840 Attention and concentration deficit: Secondary | ICD-10-CM

## 2021-08-03 DIAGNOSIS — G43709 Chronic migraine without aura, not intractable, without status migrainosus: Secondary | ICD-10-CM | POA: Diagnosis not present

## 2021-08-03 DIAGNOSIS — R569 Unspecified convulsions: Secondary | ICD-10-CM

## 2021-08-03 MED ORDER — BICTEGRAVIR-EMTRICITAB-TENOFOV 50-200-25 MG PO TABS
1.0000 | ORAL_TABLET | Freq: Every day | ORAL | 11 refills | Status: DC
  Filled 2021-08-03 – 2021-08-17 (×2): qty 30, 30d supply, fill #0
  Filled 2021-09-24: qty 30, 30d supply, fill #1
  Filled 2021-11-03: qty 30, 30d supply, fill #2
  Filled 2022-01-22: qty 30, 30d supply, fill #3
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
## 2021-08-04 NOTE — Telephone Encounter (Signed)
Received (1)  200 unit vial of Botox from [facility]. ?

## 2021-08-12 ENCOUNTER — Ambulatory Visit: Payer: Medicare Other | Admitting: Neurology

## 2021-08-13 ENCOUNTER — Ambulatory Visit: Payer: Medicare Other | Admitting: Adult Health

## 2021-08-17 ENCOUNTER — Other Ambulatory Visit (HOSPITAL_COMMUNITY): Payer: Self-pay

## 2021-08-20 ENCOUNTER — Other Ambulatory Visit (HOSPITAL_COMMUNITY): Payer: Self-pay

## 2021-08-23 ENCOUNTER — Other Ambulatory Visit (HOSPITAL_COMMUNITY): Payer: Self-pay

## 2021-09-04 ENCOUNTER — Other Ambulatory Visit: Payer: Self-pay | Admitting: Neurology

## 2021-09-14 ENCOUNTER — Ambulatory Visit (INDEPENDENT_AMBULATORY_CARE_PROVIDER_SITE_OTHER): Payer: Medicare Other | Admitting: Neurology

## 2021-09-14 ENCOUNTER — Other Ambulatory Visit (HOSPITAL_COMMUNITY): Payer: Self-pay

## 2021-09-14 DIAGNOSIS — G43709 Chronic migraine without aura, not intractable, without status migrainosus: Secondary | ICD-10-CM | POA: Diagnosis not present

## 2021-09-14 NOTE — Progress Notes (Signed)
Botox- 200 units x 1 vial ?Lot:[id number] ?Expiration: 09/025 ?NDC: 0321-2248-25 ? ?0.9% Sodium Chloride- 21mL total ?Lot: [id number] ?Expiration: 01/2024 ?NDC: 8889-1694-50 ? ?Dx: T88.828 ?S/P  ? ?

## 2021-09-14 NOTE — Progress Notes (Signed)
Consent Form ?Botulism Toxin Injection For Chronic Migraine ? ?09/14/2021: >60% improvement in migraines.  ? ?Reviewed orally with patient, additionally signature is on file: ? ?Botulism toxin has been approved by the Federal drug administration for treatment of chronic migraine. Botulism toxin does not cure chronic migraine and it may not be effective in some patients. ? ?The administration of botulism toxin is accomplished by injecting a small amount of toxin into the muscles of the neck and head. Dosage must be titrated for each individual. Any benefits resulting from botulism toxin tend to wear off after 3 months with a repeat injection required if benefit is to be maintained. Injections are usually done every 3-4 months with maximum effect peak achieved by about 2 or 3 weeks. Botulism toxin is expensive and you should be sure of what costs you will incur resulting from the injection. ? ?The side effects of botulism toxin use for chronic migraine may include: ? ? -Transient, and usually mild, facial weakness with facial injections ? -Transient, and usually mild, head or neck weakness with head/neck injections ? -Reduction or loss of forehead facial animation due to forehead muscle weakness ? -Eyelid drooping ? -Dry eye ? -Pain at the site of injection or bruising at the site of injection ? -Double vision ? -Potential unknown long term risks ? ?Contraindications: You should not have Botox if you are pregnant, nursing, allergic to albumin, have an infection, skin condition, or muscle weakness at the site of the injection, or have myasthenia gravis, Lambert-Eaton syndrome, or ALS. ? ?It is also possible that as with any injection, there may be an allergic reaction or no effect from the medication. Reduced effectiveness after repeated injections is sometimes seen and rarely infection at the injection site may occur. All care will be taken to prevent these side effects. If therapy is given over a long time, atrophy  and wasting in the muscle injected may occur. Occasionally the patient's become refractory to treatment because they develop antibodies to the toxin. In this event, therapy needs to be modified. ? ?I have read the above information and consent to the administration of botulism toxin. ? ? ? ?BOTOX PROCEDURE NOTE FOR MIGRAINE HEADACHE ? ? ? ?Contraindications and precautions discussed with patient(above). Aseptic procedure was observed and patient tolerated procedure. Procedure performed by Dr. Artemio Aly ? ?The condition has existed for more than 6 months, and pt does not have a diagnosis of ALS, Myasthenia Gravis or Lambert-Eaton Syndrome.  Risks and benefits of injections discussed and pt agrees to proceed with the procedure.  Written consent obtained ? ?These injections are medically necessary. Pt  receives good benefits from these injections. These injections do not cause sedations or hallucinations which the oral therapies may cause. ? ?Description of procedure: ? ?The patient was placed in a sitting position. The standard protocol was used for Botox as follows, with 5 units of Botox injected at each site: ? ? ?-Procerus muscle, midline injection ? ?-Corrugator muscle, bilateral injection ? ?-Frontalis muscle, bilateral injection, with 2 sites each side, medial injection was performed in the upper one third of the frontalis muscle, in the region vertical from the medial inferior edge of the superior orbital rim. The lateral injection was again in the upper one third of the forehead vertically above the lateral limbus of the cornea, 1.5 cm lateral to the medial injection site. ? ?-Temporalis muscle injection, 4 sites, bilaterally. The first injection was 3 cm above the tragus of the ear, second injection site  was 1.5 cm to 3 cm up from the first injection site in line with the tragus of the ear. The third injection site was 1.5-3 cm forward between the first 2 injection sites. The fourth injection site was 1.5 cm  posterior to the second injection site.  ? ?-Occipitalis muscle injection, 3 sites, bilaterally. The first injection was done one half way between the occipital protuberance and the tip of the mastoid process behind the ear. The second injection site was done lateral and superior to the first, 1 fingerbreadth from the first injection. The third injection site was 1 fingerbreadth superiorly and medially from the first injection site. ? ?-Cervical paraspinal muscle injection, 2 sites, bilateral knee first injection site was 1 cm from the midline of the cervical spine, 3 cm inferior to the lower border of the occipital protuberance. The second injection site was 1.5 cm superiorly and laterally to the first injection site. ? ?-Trapezius muscle injection was performed at 3 sites, bilaterally. The first injection site was in the upper trapezius muscle halfway between the inflection point of the neck, and the acromion. The second injection site was one half way between the acromion and the first injection site. The third injection was done between the first injection site and the inflection point of the neck. ? ? ?Will return for repeat injection in 3 months. ? ? ?200 units of Botox was used, any Botox not injected was wasted. The patient tolerated the procedure well, there were no complications of the above procedure. ? ? ? ?

## 2021-09-15 MED ORDER — BOTOX 200 UNITS IJ SOLR: INTRAMUSCULAR | 1 refills | Status: DC

## 2021-09-15 NOTE — Telephone Encounter (Signed)
Please send Botox Rx refill to [facility]. ?

## 2021-09-15 NOTE — Addendum Note (Signed)
Addended by: Arther Abbott on: 09/15/2021 09:15 AM ? ? Modules accepted: Orders ? ?

## 2021-09-16 ENCOUNTER — Other Ambulatory Visit (HOSPITAL_COMMUNITY): Payer: Self-pay

## 2021-09-20 ENCOUNTER — Other Ambulatory Visit (HOSPITAL_COMMUNITY): Payer: Self-pay

## 2021-09-24 ENCOUNTER — Other Ambulatory Visit (HOSPITAL_COMMUNITY): Payer: Self-pay

## 2021-09-26 ENCOUNTER — Other Ambulatory Visit: Payer: Self-pay | Admitting: Neurology

## 2021-09-26 DIAGNOSIS — G43709 Chronic migraine without aura, not intractable, without status migrainosus: Secondary | ICD-10-CM

## 2021-09-30 NOTE — Telephone Encounter (Signed)
Received (1)  200 unit vial of Botox from [facility]. ?

## 2021-10-04 ENCOUNTER — Other Ambulatory Visit: Payer: Self-pay | Admitting: Neurology

## 2021-10-04 DIAGNOSIS — G40019 Localization-related (focal) (partial) idiopathic epilepsy and epileptic syndromes with seizures of localized onset, intractable, without status epilepticus: Secondary | ICD-10-CM

## 2021-10-04 DIAGNOSIS — R110 Nausea: Secondary | ICD-10-CM

## 2021-10-18 ENCOUNTER — Other Ambulatory Visit (HOSPITAL_COMMUNITY): Payer: Self-pay

## 2021-10-20 ENCOUNTER — Other Ambulatory Visit (HOSPITAL_COMMUNITY): Payer: Self-pay

## 2021-10-22 ENCOUNTER — Other Ambulatory Visit (HOSPITAL_COMMUNITY): Payer: Self-pay

## 2021-10-28 ENCOUNTER — Ambulatory Visit (INDEPENDENT_AMBULATORY_CARE_PROVIDER_SITE_OTHER): Payer: Medicare Other | Admitting: Neurology

## 2021-10-28 ENCOUNTER — Other Ambulatory Visit: Payer: Self-pay | Admitting: Neurology

## 2021-10-28 ENCOUNTER — Encounter: Payer: Self-pay | Admitting: Neurology

## 2021-10-28 ENCOUNTER — Telehealth: Payer: Self-pay | Admitting: Neurology

## 2021-10-28 VITALS — BP 122/85 | HR 77 | Ht 66.0 in | Wt 110.0 lb

## 2021-10-28 DIAGNOSIS — G43009 Migraine without aura, not intractable, without status migrainosus: Secondary | ICD-10-CM | POA: Diagnosis not present

## 2021-10-28 DIAGNOSIS — G500 Trigeminal neuralgia: Secondary | ICD-10-CM

## 2021-10-28 DIAGNOSIS — G5710 Meralgia paresthetica, unspecified lower limb: Secondary | ICD-10-CM

## 2021-10-28 DIAGNOSIS — G40019 Localization-related (focal) (partial) idiopathic epilepsy and epileptic syndromes with seizures of localized onset, intractable, without status epilepticus: Secondary | ICD-10-CM

## 2021-10-28 DIAGNOSIS — G43709 Chronic migraine without aura, not intractable, without status migrainosus: Secondary | ICD-10-CM | POA: Diagnosis not present

## 2021-10-28 DIAGNOSIS — W57XXXA Bitten or stung by nonvenomous insect and other nonvenomous arthropods, initial encounter: Secondary | ICD-10-CM

## 2021-10-28 MED ORDER — UBRELVY 100 MG PO TABS: 100.0000 mg | ORAL_TABLET | ORAL | 11 refills | Status: DC | PRN

## 2021-10-28 MED ORDER — ELETRIPTAN HYDROBROMIDE 40 MG PO TABS: 40.0000 mg | ORAL_TABLET | ORAL | 11 refills | Status: AC | PRN
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
## 2021-10-28 NOTE — Telephone Encounter (Signed)
PA completed on [facility]  KEY: [id number] Will await determination.

## 2021-11-03 ENCOUNTER — Other Ambulatory Visit (HOSPITAL_COMMUNITY): Payer: Self-pay

## 2021-11-03 LAB — RICKETTSIAL FEVER GROUP IGG/M
Spotted Fever Group IgG: <1:64 {titer}
Spotted Fever Group IgM: <1:64 {titer}
Typhus Fever Group IgG: <1:64 {titer}
Typhus Fever Group IgM: <1:64 {titer}

## 2021-11-03 LAB — LYME DISEASE SEROLOGY W/REFLEX: Lyme Total Antibody EIA: NEGATIVE

## 2021-11-09 ENCOUNTER — Ambulatory Visit: Payer: Medicare Other | Admitting: Neurology

## 2021-11-23 ENCOUNTER — Other Ambulatory Visit (HOSPITAL_COMMUNITY): Payer: Self-pay

## 2021-11-27 ENCOUNTER — Other Ambulatory Visit: Payer: Self-pay | Admitting: Neurology

## 2021-11-29 ENCOUNTER — Other Ambulatory Visit (HOSPITAL_COMMUNITY): Payer: Self-pay

## 2021-12-02 ENCOUNTER — Other Ambulatory Visit: Payer: Self-pay | Admitting: Neurology

## 2021-12-02 NOTE — Telephone Encounter (Signed)
Received (1) 200 unit vial of Botox from [facility]. ?

## 2021-12-07 ENCOUNTER — Ambulatory Visit: Payer: Medicare Other | Admitting: Neurology

## 2021-12-15 ENCOUNTER — Ambulatory Visit (INDEPENDENT_AMBULATORY_CARE_PROVIDER_SITE_OTHER): Payer: Medicare Other | Admitting: Neurology

## 2021-12-15 DIAGNOSIS — G43709 Chronic migraine without aura, not intractable, without status migrainosus: Secondary | ICD-10-CM | POA: Diagnosis not present

## 2021-12-15 MED ORDER — ONABOTULINUMTOXINA 200 UNITS IJ SOLR: 155.0000 [IU] | Freq: Once | INTRAMUSCULAR | Status: AC

## 2021-12-15 NOTE — Progress Notes (Signed)
Botox- 200 units x 1 vial Lot: [id number] Expiration: 06/2024 NDC: 1438-8875-79  Bacteriostatic 0.9% Sodium Chloride- 78mL total Lot: [id number] Expiration:12/29/2022 NDC: 7282-0601-56  Dx: G43.709 S/P

## 2021-12-15 NOTE — Progress Notes (Signed)
Consent Form Botulism Toxin Injection For Chronic Migraine  12/15/2021: >60% improvement in migraines.   Reviewed orally with patient, additionally signature is on file:  Botulism toxin has been approved by the Federal drug administration for treatment of chronic migraine. Botulism toxin does not cure chronic migraine and it may not be effective in some patients.  The administration of botulism toxin is accomplished by injecting a small amount of toxin into the muscles of the neck and head. Dosage must be titrated for each individual. Any benefits resulting from botulism toxin tend to wear off after 3 months with a repeat injection required if benefit is to be maintained. Injections are usually done every 3-4 months with maximum effect peak achieved by about 2 or 3 weeks. Botulism toxin is expensive and you should be sure of what costs you will incur resulting from the injection.  The side effects of botulism toxin use for chronic migraine may include:   -Transient, and usually mild, facial weakness with facial injections  -Transient, and usually mild, head or neck weakness with head/neck injections  -Reduction or loss of forehead facial animation due to forehead muscle weakness  -Eyelid drooping  -Dry eye  -Pain at the site of injection or bruising at the site of injection  -Double vision  -Potential unknown long term risks  Contraindications: You should not have Botox if you are pregnant, nursing, allergic to albumin, have an infection, skin condition, or muscle weakness at the site of the injection, or have myasthenia gravis, Lambert-Eaton syndrome, or ALS.  It is also possible that as with any injection, there may be an allergic reaction or no effect from the medication. Reduced effectiveness after repeated injections is sometimes seen and rarely infection at the injection site may occur. All care will be taken to prevent these side effects. If therapy is given over a long time, atrophy  and wasting in the muscle injected may occur. Occasionally the patient's become refractory to treatment because they develop antibodies to the toxin. In this event, therapy needs to be modified.  I have read the above information and consent to the administration of botulism toxin.    BOTOX PROCEDURE NOTE FOR MIGRAINE HEADACHE    Contraindications and precautions discussed with patient(above). Aseptic procedure was observed and patient tolerated procedure. Procedure performed by Dr. Artemio Aly  The condition has existed for more than 6 months, and pt does not have a diagnosis of ALS, Myasthenia Gravis or Lambert-Eaton Syndrome.  Risks and benefits of injections discussed and pt agrees to proceed with the procedure.  Written consent obtained  These injections are medically necessary. Pt  receives good benefits from these injections. These injections do not cause sedations or hallucinations which the oral therapies may cause.  Description of procedure:  The patient was placed in a sitting position. The standard protocol was used for Botox as follows, with 5 units of Botox injected at each site:   -Procerus muscle, midline injection  -Corrugator muscle, bilateral injection  -Frontalis muscle, bilateral injection, with 2 sites each side, medial injection was performed in the upper one third of the frontalis muscle, in the region vertical from the medial inferior edge of the superior orbital rim. The lateral injection was again in the upper one third of the forehead vertically above the lateral limbus of the cornea, 1.5 cm lateral to the medial injection site.  -Temporalis muscle injection, 4 sites, bilaterally. The first injection was 3 cm above the tragus of the ear, second injection site  was 1.5 cm to 3 cm up from the first injection site in line with the tragus of the ear. The third injection site was 1.5-3 cm forward between the first 2 injection sites. The fourth injection site was 1.5 cm  posterior to the second injection site.   -Occipitalis muscle injection, 3 sites, bilaterally. The first injection was done one half way between the occipital protuberance and the tip of the mastoid process behind the ear. The second injection site was done lateral and superior to the first, 1 fingerbreadth from the first injection. The third injection site was 1 fingerbreadth superiorly and medially from the first injection site.  -Cervical paraspinal muscle injection, 2 sites, bilateral knee first injection site was 1 cm from the midline of the cervical spine, 3 cm inferior to the lower border of the occipital protuberance. The second injection site was 1.5 cm superiorly and laterally to the first injection site.  -Trapezius muscle injection was performed at 3 sites, bilaterally. The first injection site was in the upper trapezius muscle halfway between the inflection point of the neck, and the acromion. The second injection site was one half way between the acromion and the first injection site. The third injection was done between the first injection site and the inflection point of the neck.   Will return for repeat injection in 3 months.   200 units of Botox was used, any Botox not injected was wasted. The patient tolerated the procedure well, there were no complications of the above procedure.

## 2021-12-17 ENCOUNTER — Other Ambulatory Visit: Payer: Self-pay | Admitting: Neurology

## 2021-12-17 DIAGNOSIS — G43709 Chronic migraine without aura, not intractable, without status migrainosus: Secondary | ICD-10-CM

## 2022-01-22 ENCOUNTER — Other Ambulatory Visit (HOSPITAL_COMMUNITY): Payer: Self-pay

## 2022-01-24 ENCOUNTER — Other Ambulatory Visit (HOSPITAL_COMMUNITY): Payer: Self-pay

## 2022-01-26 ENCOUNTER — Other Ambulatory Visit (HOSPITAL_COMMUNITY): Payer: Self-pay

## 2022-02-07 ENCOUNTER — Ambulatory Visit (INDEPENDENT_AMBULATORY_CARE_PROVIDER_SITE_OTHER): Payer: Medicare Other | Admitting: Infectious Disease

## 2022-02-07 ENCOUNTER — Other Ambulatory Visit (HOSPITAL_COMMUNITY): Payer: Self-pay

## 2022-02-07 ENCOUNTER — Encounter: Payer: Self-pay | Admitting: Infectious Disease

## 2022-02-07 ENCOUNTER — Other Ambulatory Visit: Payer: Self-pay

## 2022-02-07 VITALS — BP 134/89 | HR 90 | Temp 98.1°F | Ht 66.0 in | Wt 112.0 lb

## 2022-02-07 DIAGNOSIS — R41840 Attention and concentration deficit: Secondary | ICD-10-CM

## 2022-02-07 DIAGNOSIS — E782 Mixed hyperlipidemia: Secondary | ICD-10-CM | POA: Diagnosis not present

## 2022-02-07 DIAGNOSIS — R569 Unspecified convulsions: Secondary | ICD-10-CM

## 2022-02-07 DIAGNOSIS — E785 Hyperlipidemia, unspecified: Secondary | ICD-10-CM

## 2022-02-07 DIAGNOSIS — G43009 Migraine without aura, not intractable, without status migrainosus: Secondary | ICD-10-CM

## 2022-02-07 DIAGNOSIS — B20 Human immunodeficiency virus [HIV] disease: Secondary | ICD-10-CM | POA: Diagnosis not present

## 2022-02-07 HISTORY — DX: Hyperlipidemia, unspecified: E78.5

## 2022-02-07 MED ORDER — BICTEGRAVIR-EMTRICITAB-TENOFOV 50-200-25 MG PO TABS
1.0000 | ORAL_TABLET | Freq: Every day | ORAL | 11 refills | Status: DC
  Filled 2022-02-07 – 2022-02-24 (×2): qty 30, 30d supply, fill #0

## 2022-02-07 MED ORDER — PRAVASTATIN SODIUM 40 MG PO TABS
40.0000 mg | ORAL_TABLET | Freq: Every day | ORAL | 11 refills | Status: DC
  Filled 2022-02-07: qty 30, 30d supply, fill #0
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
## 2022-02-08 LAB — T-HELPER CELLS (CD4) COUNT (NOT AT ARMC)
CD4 % Helper T Cell: 49 % (ref 33–65)
CD4 T Cell Abs: 769 /uL (ref 400–1790)

## 2022-02-10 LAB — CBC WITH DIFFERENTIAL/PLATELET
Absolute Monocytes: 371 cells/uL (ref 200–950)
Basophils Absolute: 32 cells/uL (ref 0–200)
Basophils Relative: 0.5 %
Eosinophils Absolute: 243 cells/uL (ref 15–500)
Eosinophils Relative: 3.8 %
HCT: 37.9 % (ref 35.0–45.0)
Hemoglobin: 13.3 g/dL (ref 11.7–15.5)
Lymphs Abs: 1677 cells/uL (ref 850–3900)
MCH: 34.1 pg — ABNORMAL HIGH (ref 27.0–33.0)
MCHC: 35.1 g/dL (ref 32.0–36.0)
MCV: 97.2 fL (ref 80.0–100.0)
MPV: 9 fL (ref 7.5–12.5)
Monocytes Relative: 5.8 %
Neutro Abs: 4077 cells/uL (ref 1500–7800)
Neutrophils Relative %: 63.7 %
Platelets: 242 10*3/uL (ref 140–400)
RBC: 3.9 10*6/uL (ref 3.80–5.10)
RDW: 11.2 % (ref 11.0–15.0)
Total Lymphocyte: 26.2 %
WBC: 6.4 10*3/uL (ref 3.8–10.8)

## 2022-02-10 LAB — COMPLETE METABOLIC PANEL WITH GFR
AG Ratio: 1.9 (calc) (ref 1.0–2.5)
ALT: 13 U/L (ref 6–29)
AST: 14 U/L (ref 10–30)
Albumin: 4.4 g/dL (ref 3.6–5.1)
Alkaline phosphatase (APISO): 74 U/L (ref 31–125)
BUN: 12 mg/dL (ref 7–25)
CO2: 28 mmol/L (ref 20–32)
Calcium: 9.3 mg/dL (ref 8.6–10.2)
Chloride: 105 mmol/L (ref 98–110)
Creat: 0.75 mg/dL (ref 0.50–0.99)
Globulin: 2.3 g/dL (calc) (ref 1.9–3.7)
Glucose, Bld: 126 mg/dL — ABNORMAL HIGH (ref 65–99)
Potassium: 4.2 mmol/L (ref 3.5–5.3)
Sodium: 144 mmol/L (ref 135–146)
Total Bilirubin: 0.4 mg/dL (ref 0.2–1.2)
Total Protein: 6.7 g/dL (ref 6.1–8.1)
eGFR: 102 mL/min/{1.73_m2} (ref >=60)

## 2022-02-10 LAB — LIPID PANEL
Cholesterol: 185 mg/dL (ref <200)
HDL: 66 mg/dL (ref >=50)
LDL Cholesterol (Calc): 104 mg/dL (calc) — ABNORMAL HIGH
Non-HDL Cholesterol (Calc): 119 mg/dL (calc) (ref <130)
Total CHOL/HDL Ratio: 2.8 (calc) (ref <5.0)
Triglycerides: 68 mg/dL (ref <150)

## 2022-02-10 LAB — RPR: RPR Ser Ql: NONREACTIVE

## 2022-02-10 LAB — HIV-1 RNA QUANT-NO REFLEX-BLD
HIV 1 RNA Quant: NOT DETECTED Copies/mL
HIV-1 RNA Quant, Log: NOT DETECTED Log cps/mL

## 2022-02-14 ENCOUNTER — Other Ambulatory Visit: Payer: Self-pay

## 2022-02-14 ENCOUNTER — Encounter (INDEPENDENT_AMBULATORY_CARE_PROVIDER_SITE_OTHER): Payer: Self-pay

## 2022-02-14 VITALS — BP 111/76 | HR 79 | Temp 97.8°F | Resp 16 | Wt 111.0 lb

## 2022-02-14 DIAGNOSIS — Z006 Encounter for examination for normal comparison and control in clinical research program: Secondary | ICD-10-CM

## 2022-02-14 NOTE — Research (Signed)
Individual seen for Screening Visit for [employer]. [id number] is a Phase 3, Randomized, Active-Controlled, Double-Blind Clinical Study to Evaluate a Switch to Doravirine/Islatravir (DOR/ISL 100 mg/0.25 mg) Once-Daily in Participants With HIV-1 Who Are Virologically Suppressed on Bictegravir/Emtricitabine/Tenofovir Alafenamide (BIC/FTC/TAF). Informed Consent explained/reviewed. The risks, benefits, responsibilities and other options were reviewed. Questions answered, comprehension of the study was assessed and adequate time to consider options was provided. Individual verbalized understanding and signed the informed consent witnessed by me. Signed consent was obtained prior to any procedures being done. Procedures carried out per protocol. Scheduled for Entry on Oct. 2nd, pending eligibility.

## 2022-02-17 ENCOUNTER — Encounter (INDEPENDENT_AMBULATORY_CARE_PROVIDER_SITE_OTHER): Payer: Self-pay

## 2022-02-17 ENCOUNTER — Other Ambulatory Visit: Payer: Self-pay

## 2022-02-17 DIAGNOSIS — Z006 Encounter for examination for normal comparison and control in clinical research program: Secondary | ICD-10-CM

## 2022-02-17 NOTE — Research (Signed)
[name] seen by Research for lab recollect.

## 2022-02-24 ENCOUNTER — Other Ambulatory Visit (HOSPITAL_COMMUNITY): Payer: Self-pay

## 2022-02-28 ENCOUNTER — Other Ambulatory Visit: Payer: Self-pay

## 2022-02-28 ENCOUNTER — Encounter (INDEPENDENT_AMBULATORY_CARE_PROVIDER_SITE_OTHER): Payer: Self-pay

## 2022-02-28 VITALS — BP 139/76 | HR 76 | Temp 97.8°F | Resp 16 | Ht 66.54 in | Wt 113.8 lb

## 2022-02-28 DIAGNOSIS — Z006 Encounter for examination for normal comparison and control in clinical research program: Secondary | ICD-10-CM

## 2022-02-28 MED ORDER — STUDY - MK-8591A-052 - BICTEGRAVIR/EMTRICITABINE/TENOFOVIR ALAFENAMIDE (BIKTARVY) 50-200-25 MG OR PLACEBO TABLET (PI-VAN DAM): 1.0000 | ORAL_TABLET | Freq: Every day | ORAL | Status: DC

## 2022-02-28 MED ORDER — STUDY - MK-8591A-052 - MK-8591A 100/0.25 MG OR PLACEBO TABLET (PI-VAN DAM): 1.0000 | ORAL_TABLET | Freq: Every day | ORAL | Status: DC

## 2022-02-28 NOTE — Research (Signed)
Individual seen for Day 1 visit for [id number]. A Phase 3, Randomized, Active-Controlled, Double-Blind Clinical Study to Evaluate a Switch to Doravirine/Islatravir (DOR/ISL 100 mg/0.25 mg) Once-Daily in Participants With HIV-1 Who Are Virologically Suppressed on Bictegravir/Emtricitabine/Tenofovir Alafenamide (BIC/FTC/TAF). All procedures carried out per study protocol. IDS meds dispensed. Plan to see again in 4 weeks.

## 2022-03-01 ENCOUNTER — Other Ambulatory Visit: Payer: Self-pay

## 2022-03-01 DIAGNOSIS — Z006 Encounter for examination for normal comparison and control in clinical research program: Secondary | ICD-10-CM

## 2022-03-10 ENCOUNTER — Ambulatory Visit: Payer: Medicare Other | Admitting: Neurology

## 2022-03-15 ENCOUNTER — Other Ambulatory Visit (HOSPITAL_COMMUNITY): Payer: Self-pay

## 2022-03-15 ENCOUNTER — Ambulatory Visit: Payer: Medicare Other | Admitting: Neurology

## 2022-03-17 ENCOUNTER — Other Ambulatory Visit (HOSPITAL_COMMUNITY): Payer: Self-pay

## 2022-03-17 ENCOUNTER — Ambulatory Visit (INDEPENDENT_AMBULATORY_CARE_PROVIDER_SITE_OTHER): Payer: Medicare Other | Admitting: Neurology

## 2022-03-17 ENCOUNTER — Encounter: Payer: Self-pay | Admitting: Neurology

## 2022-03-17 VITALS — BP 142/85 | HR 102 | Ht 66.0 in | Wt 117.0 lb

## 2022-03-17 DIAGNOSIS — Z79899 Other long term (current) drug therapy: Secondary | ICD-10-CM | POA: Diagnosis not present

## 2022-03-17 DIAGNOSIS — G40019 Localization-related (focal) (partial) idiopathic epilepsy and epileptic syndromes with seizures of localized onset, intractable, without status epilepticus: Secondary | ICD-10-CM

## 2022-03-17 DIAGNOSIS — G5710 Meralgia paresthetica, unspecified lower limb: Secondary | ICD-10-CM | POA: Diagnosis not present

## 2022-03-17 DIAGNOSIS — G43009 Migraine without aura, not intractable, without status migrainosus: Secondary | ICD-10-CM

## 2022-03-17 DIAGNOSIS — G500 Trigeminal neuralgia: Secondary | ICD-10-CM

## 2022-03-17 DIAGNOSIS — G43709 Chronic migraine without aura, not intractable, without status migrainosus: Secondary | ICD-10-CM | POA: Diagnosis not present

## 2022-03-17 MED ORDER — DICLOFENAC SODIUM 1 % EX GEL: 4.0000 g | Freq: Four times a day (QID) | CUTANEOUS | 11 refills | Status: AC

## 2022-03-17 MED ORDER — ZAVZPRET 10 MG/ACT NA SOLN: 1.0000 | Freq: Every day | NASAL | 11 refills | Status: DC | PRN

## 2022-03-17 NOTE — Patient Instructions (Signed)
Voltaren gel topically for the aching Zavzpret: once daily at onset of headache  Meds ordered this encounter  Medications   Zavegepant HCl (ZAVZPRET) 10 MG/ACT SOLN    Sig: Place 1 spray into the nose daily as needed.    Dispense:  6 each    Refill:  11   diclofenac Sodium (VOLTAREN) 1 % GEL    Sig: Apply 4 g topically 4 (four) times daily.    Dispense:  350 g    Refill:  11   Orders Placed This Encounter  Procedures   Lamotrigine level
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
## 2022-03-18 LAB — LAMOTRIGINE LEVEL: Lamotrigine Lvl: 6.7 ug/mL (ref 2.0–20.0)

## 2022-03-21 ENCOUNTER — Telehealth: Payer: Self-pay

## 2022-03-21 ENCOUNTER — Other Ambulatory Visit (HOSPITAL_COMMUNITY): Payer: Self-pay

## 2022-03-21 ENCOUNTER — Other Ambulatory Visit: Payer: Self-pay | Admitting: Neurology

## 2022-03-21 MED ORDER — LAMOTRIGINE 200 MG PO TABS: 300.0000 mg | ORAL_TABLET | Freq: Two times a day (BID) | ORAL | 4 refills | Status: AC

## 2022-03-21 MED ORDER — LAMOTRIGINE 200 MG PO TABS
300.0000 mg | ORAL_TABLET | Freq: Two times a day (BID) | ORAL | 4 refills | Status: DC
  Filled 2022-03-21: qty 270, 90d supply, fill #0

## 2022-03-21 NOTE — Telephone Encounter (Signed)
-----   Message from Melvenia Beam, MD sent at 03/20/2022  9:39 PM EDT ----- We can go up on your lamotrigine if you like, your level is not elevated. Would you like to increase as we discussed at appointment? I'll ask my nurses to call you thanks

## 2022-03-21 NOTE — Telephone Encounter (Signed)
I called pt and relayed that per Dr. [name] that she did change it to 600mg  a day (300 twice daily).  We will check her levels at next appointment.  Pt verbalized understanding.  [facility] is where she wanted it to go.  I called [facility] and cancelled and then escribed to [facility].  Pt was appreciative.

## 2022-03-21 NOTE — Telephone Encounter (Signed)
Contacted pt to inform her of results. She has agreed to increase Lamotrigine, what would you like to increase to ?

## 2022-03-21 NOTE — Addendum Note (Signed)
Addended by: Brandon Melnick on: 03/21/2022 02:08 PM   Modules accepted: Orders

## 2022-03-24 ENCOUNTER — Ambulatory Visit (INDEPENDENT_AMBULATORY_CARE_PROVIDER_SITE_OTHER): Payer: Medicare Other | Admitting: Neurology

## 2022-03-24 DIAGNOSIS — G43709 Chronic migraine without aura, not intractable, without status migrainosus: Secondary | ICD-10-CM

## 2022-03-24 MED ORDER — ONABOTULINUMTOXINA 200 UNITS IJ SOLR
155.0000 [IU] | Freq: Once | INTRAMUSCULAR | Status: AC
  Administered 2022-03-24: 155 [IU] via INTRAMUSCULAR

## 2022-03-24 NOTE — Progress Notes (Signed)
Botox- 200 units x 1 vial Lot: [id number] Expiration: 04/2024 NDC: 5916-3846-65  Bacteriostatic 0.9% Sodium Chloride- 66mL total Lot: [id number] Expiration: 01/29/2023 NDC: 1779-3903-00  Dx: P23.300 S/P

## 2022-03-24 NOTE — Progress Notes (Signed)
Consent Form Botulism Toxin Injection For Chronic Migraine  03/24/2022: stable 12/15/2021: >60% improvement in migraines.   Reviewed orally with patient, additionally signature is on file:  Botulism toxin has been approved by the Federal drug administration for treatment of chronic migraine. Botulism toxin does not cure chronic migraine and it may not be effective in some patients.  The administration of botulism toxin is accomplished by injecting a small amount of toxin into the muscles of the neck and head. Dosage must be titrated for each individual. Any benefits resulting from botulism toxin tend to wear off after 3 months with a repeat injection required if benefit is to be maintained. Injections are usually done every 3-4 months with maximum effect peak achieved by about 2 or 3 weeks. Botulism toxin is expensive and you should be sure of what costs you will incur resulting from the injection.  The side effects of botulism toxin use for chronic migraine may include:   -Transient, and usually mild, facial weakness with facial injections  -Transient, and usually mild, head or neck weakness with head/neck injections  -Reduction or loss of forehead facial animation due to forehead muscle weakness  -Eyelid drooping  -Dry eye  -Pain at the site of injection or bruising at the site of injection  -Double vision  -Potential unknown long term risks  Contraindications: You should not have Botox if you are pregnant, nursing, allergic to albumin, have an infection, skin condition, or muscle weakness at the site of the injection, or have myasthenia gravis, Lambert-Eaton syndrome, or ALS.  It is also possible that as with any injection, there may be an allergic reaction or no effect from the medication. Reduced effectiveness after repeated injections is sometimes seen and rarely infection at the injection site may occur. All care will be taken to prevent these side effects. If therapy is given over a  long time, atrophy and wasting in the muscle injected may occur. Occasionally the patient's become refractory to treatment because they develop antibodies to the toxin. In this event, therapy needs to be modified.  I have read the above information and consent to the administration of botulism toxin.    BOTOX PROCEDURE NOTE FOR MIGRAINE HEADACHE    Contraindications and precautions discussed with patient(above). Aseptic procedure was observed and patient tolerated procedure. Procedure performed by Dr. Georgia Dom  The condition has existed for more than 6 months, and pt does not have a diagnosis of ALS, Myasthenia Gravis or Lambert-Eaton Syndrome.  Risks and benefits of injections discussed and pt agrees to proceed with the procedure.  Written consent obtained  These injections are medically necessary. Pt  receives good benefits from these injections. These injections do not cause sedations or hallucinations which the oral therapies may cause.  Description of procedure:  The patient was placed in a sitting position. The standard protocol was used for Botox as follows, with 5 units of Botox injected at each site:   -Procerus muscle, midline injection  -Corrugator muscle, bilateral injection  -Frontalis muscle, bilateral injection, with 2 sites each side, medial injection was performed in the upper one third of the frontalis muscle, in the region vertical from the medial inferior edge of the superior orbital rim. The lateral injection was again in the upper one third of the forehead vertically above the lateral limbus of the cornea, 1.5 cm lateral to the medial injection site.  -Temporalis muscle injection, 4 sites, bilaterally. The first injection was 3 cm above the tragus of the ear, second  injection site was 1.5 cm to 3 cm up from the first injection site in line with the tragus of the ear. The third injection site was 1.5-3 cm forward between the first 2 injection sites. The fourth  injection site was 1.5 cm posterior to the second injection site.   -Occipitalis muscle injection, 3 sites, bilaterally. The first injection was done one half way between the occipital protuberance and the tip of the mastoid process behind the ear. The second injection site was done lateral and superior to the first, 1 fingerbreadth from the first injection. The third injection site was 1 fingerbreadth superiorly and medially from the first injection site.  -Cervical paraspinal muscle injection, 2 sites, bilateral knee first injection site was 1 cm from the midline of the cervical spine, 3 cm inferior to the lower border of the occipital protuberance. The second injection site was 1.5 cm superiorly and laterally to the first injection site.  -Trapezius muscle injection was performed at 3 sites, bilaterally. The first injection site was in the upper trapezius muscle halfway between the inflection point of the neck, and the acromion. The second injection site was one half way between the acromion and the first injection site. The third injection was done between the first injection site and the inflection point of the neck.   Will return for repeat injection in 3 months.   200 units of Botox was used, any Botox not injected was wasted. The patient tolerated the procedure well, there were no complications of the above procedure.

## 2022-03-25 ENCOUNTER — Other Ambulatory Visit: Payer: Self-pay | Admitting: Neurology

## 2022-03-28 ENCOUNTER — Encounter (INDEPENDENT_AMBULATORY_CARE_PROVIDER_SITE_OTHER): Payer: Self-pay

## 2022-03-28 ENCOUNTER — Other Ambulatory Visit: Payer: Self-pay

## 2022-03-28 VITALS — BP 136/87 | HR 79 | Temp 97.9°F | Resp 16 | Wt 114.9 lb

## 2022-03-28 DIAGNOSIS — Z006 Encounter for examination for normal comparison and control in clinical research program: Secondary | ICD-10-CM

## 2022-03-28 MED ORDER — STUDY - MK-8591A-052 - MK-8591A 100/0.25 MG OR PLACEBO TABLET (PI-VAN DAM): 1.0000 | ORAL_TABLET | Freq: Every day | ORAL | 0 refills | Status: DC

## 2022-03-28 MED ORDER — STUDY - MK-8591A-052 - BICTEGRAVIR/EMTRICITABINE/TENOFOVIR ALAFENAMIDE (BIKTARVY) 50-200-25 MG OR PLACEBO TABLET (PI-VAN DAM): 1.0000 | ORAL_TABLET | Freq: Every day | ORAL | 0 refills | Status: DC

## 2022-03-28 NOTE — Research (Signed)
Participant seen for Week 4 visit of [id number], A Phase 3, Randomized, Active-Controlled, Double-Blind Clinical Study to Evaluate a Switch to Doravirine/Islatravir (DOR/ISL 100 mg/0.25 mg) Once-Daily in Participants With HIV-1 Who Are Virologically Suppressed on Bictegravir/Emtricitabine/Tenofovir Alafenamide (BIC/FTC/TAF). Procedures carried out per Protocol. Study Meds dispensed. Plan to see again for Week 12 visit on Dec.18 2023.

## 2022-03-29 ENCOUNTER — Ambulatory Visit (INDEPENDENT_AMBULATORY_CARE_PROVIDER_SITE_OTHER): Payer: Medicare Other | Admitting: Podiatry

## 2022-03-29 DIAGNOSIS — B070 Plantar wart: Secondary | ICD-10-CM

## 2022-03-29 DIAGNOSIS — S90454A Superficial foreign body, right lesser toe(s), initial encounter: Secondary | ICD-10-CM | POA: Diagnosis not present

## 2022-03-29 NOTE — Progress Notes (Signed)
Splinter right great toe; 2 mm in length  Plantar wart fifth MTP left.  Debrided.  Salicylic acid.  Recommend OTC wart remover.  As needed    Subjective: 41 y.o. female presenting today as a new patient for evaluation of a symptomatic plantar wart to the left foot.  The patient has not done anything currently for treatment and it has been present for several months.  Aggravated with walking. Patient has also noticed pain and tenderness associated to the plantar aspect of the right great toe for the past few weeks.  Denies a history of injury.  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   Resolved [id number]   History of hyperthyroidism 2020   Resolved 2020   History of palpitations 2020   Resolved 2020   History of substance abuse ([place]) 2014   In recovery since 2016   HIV infection ([place]) 07/24/2014   Hyperglycemia 08/22/2018   Resolved 2020   Hyperlipidemia 02/07/2022   Hyperthyroidism 08/22/2018   Resolved 2020   Migraines 2005   Peripheral neuropathy 2016   fingers both hands   Pulmonary mycobacterial infection ([place]) 11/12/2018   Resolved Aug2020   Pyloric stenosis 02/17/2020   Seizures ([place]) 05/01/2014   last seizure > 2 year ago   Skin lesion 02/16/2021   Resolved [id number]   Smoker 11/12/2018   Trigeminal neuralgia of right side of face 08/26/2015   Trigger thumb of right hand 03/2018   Surgical Release 02Dec2019    Objective: Physical Exam General: The patient is alert and oriented x3 in no acute distress.   Dermatology: Hyperkeratotic skin lesion(s) noted to the plantar aspect of the left foot approximately 1 cm in diameter. Pinpoint bleeding noted upon debridement. Skin is  warm, dry and supple bilateral lower extremities.  There is also associated tenderness with a foreign body embedded within the deeper dermal layers of the skin plantar aspect of the right great toe.   Vascular: Palpable pedal pulses bilaterally. No edema or erythema noted. Capillary refill within normal limits.   Neurological: Epicritic and protective threshold grossly intact bilaterally.    Musculoskeletal Exam: Pain on palpation to the noted skin lesion(s).  Range of motion within normal limits to all pedal and ankle joints bilateral. Muscle strength 5/5 in all groups bilateral.    Assessment: #1 plantar wart left foot plantar fifth MTP #2 foreign body splinter right great toe.  2 mm in length   Plan of Care:  #1 Patient was evaluated. #2 Excisional debridement of the plantar wart lesion(s) was performed using a chisel blade.  Salicylic acid was applied and the lesion(s) was dressed with a dry sterile dressing. #3  Careful excisional debridement of the foreign body splinter was performed using a 312 scalpel and the splinter was removed in toto.  The patient felt immediate relief.  The foreign body splinter was embedded within the deeper dermal layers and did not extend into the subcutaneous tissue.  Recommend triple antibiotic and a Band-Aid x1 week #4 patient is to return to clinic as needed  Edrick Kins, DPM [facility]  Dr. Edrick Kins, DPM    [address]  [place], [place] [address]                Office [phone]  Fax [phone]

## 2022-04-12 ENCOUNTER — Other Ambulatory Visit (HOSPITAL_COMMUNITY): Payer: Self-pay

## 2022-05-18 ENCOUNTER — Other Ambulatory Visit: Payer: Self-pay

## 2022-05-18 ENCOUNTER — Encounter (INDEPENDENT_AMBULATORY_CARE_PROVIDER_SITE_OTHER): Payer: Self-pay

## 2022-05-18 DIAGNOSIS — Z006 Encounter for examination for normal comparison and control in clinical research program: Secondary | ICD-10-CM

## 2022-05-18 MED ORDER — STUDY - MK-8591A-052 - BICTEGRAVIR/EMTRICITABINE/TENOFOVIR ALAFENAMIDE (BIKTARVY) 50-200-25 MG OR PLACEBO TABLET (PI-VAN DAM): 1.0000 | ORAL_TABLET | Freq: Every day | ORAL | 0 refills | Status: DC

## 2022-05-18 MED ORDER — STUDY - MK-8591A-052 - MK-8591A 100/0.25 MG OR PLACEBO TABLET (PI-VAN DAM): 1.0000 | ORAL_TABLET | Freq: Every day | ORAL | 0 refills | Status: DC

## 2022-05-18 NOTE — Research (Signed)
Individual seen for Week 12 visit for Study [id number], A Phase 3, Randomized, Active-Controlled, Double-Blind Clinical Study to Evaluate a Switch to Doravirine/Islatravir (DOR/ISL 100 mg/0.25 mg) Once-Daily in Participants With HIV-1 Who Are Virologically Suppressed on Bictegravir/Emtricitabine/Tenofovir Alafenamide (BIC/FTC/TAF). All procedures carried our per study protocol. Investigational meds dispensed. Plan to see again in 12 weeks.

## 2022-06-06 ENCOUNTER — Telehealth: Payer: Self-pay | Admitting: Neurology

## 2022-06-06 DIAGNOSIS — G43709 Chronic migraine without aura, not intractable, without status migrainosus: Secondary | ICD-10-CM

## 2022-06-06 NOTE — Telephone Encounter (Signed)
Pt scheduled for botox injection for 06/21/22 and will need a new PA before appointment. Previous PA expired 04/08/22.

## 2022-06-06 NOTE — Telephone Encounter (Signed)
Pt states there were no changes in her insurance for the year 2024.

## 2022-06-06 NOTE — Telephone Encounter (Signed)
Chronic Migraine CPT 64615  Botox [id number] Units:200  G43.709 Chronic Migraine without aura, not intractable, without status migrainous   

## 2022-06-08 ENCOUNTER — Other Ambulatory Visit: Payer: Self-pay

## 2022-06-08 ENCOUNTER — Telehealth: Payer: Self-pay | Admitting: *Deleted

## 2022-06-08 ENCOUNTER — Other Ambulatory Visit (HOSPITAL_COMMUNITY): Payer: Self-pay

## 2022-06-08 MED ORDER — BOTOX 200 UNITS IJ SOLR
INTRAMUSCULAR | 3 refills | Status: DC
  Filled 2022-06-08: qty 1, 84d supply, fill #0
  Filled 2022-08-30: qty 1, 84d supply, fill #1

## 2022-06-08 NOTE — Telephone Encounter (Signed)
Noted thanks. Rx sent to [facility] outpatient pharmacy.

## 2022-06-08 NOTE — Telephone Encounter (Signed)
Called pt. Rescheduled OV appointment for 07/27/2022 @ 8:30 am with Dr. [name]. Pt said thank you for calling.

## 2022-06-08 NOTE — Addendum Note (Signed)
Addended by: Gildardo Griffes on: 06/08/2022 01:41 PM   Modules accepted: Orders

## 2022-06-08 NOTE — Telephone Encounter (Signed)
Pharmacy Patient Advocate Encounter  Prior Authorization for Botox 200UNIT solution has been approved.    PA# PA Case ID: [id number] Effective dates: 06/08/2022 through 09/07/2022  Per test billing the copay will be zero cost and it can be filled with [facility].

## 2022-06-08 NOTE — Telephone Encounter (Signed)
Pt has botox on 06/21/22 and then an OV on 06/29/22. Insurance won't pay for the 1/31 visit since its within 10 days after botox. Please call patient and reschedule the 1/31 appt for a later date.

## 2022-06-08 NOTE — Telephone Encounter (Signed)
Patient Advocate Encounter   Received notification that prior authorization for Botox 200UNIT solution is required.   PA submitted on 06/08/2022 Key [id number] Status is pending       Lyndel Safe, [place] Patient Advocate Specialist [place] Patient Advocate Team Direct Number: [phone]  Fax: [phone]

## 2022-06-21 ENCOUNTER — Ambulatory Visit (INDEPENDENT_AMBULATORY_CARE_PROVIDER_SITE_OTHER): Payer: 59 | Admitting: Neurology

## 2022-06-21 DIAGNOSIS — G43709 Chronic migraine without aura, not intractable, without status migrainosus: Secondary | ICD-10-CM | POA: Diagnosis not present

## 2022-06-21 MED ORDER — ONABOTULINUMTOXINA 200 UNITS IJ SOLR
155.0000 [IU] | Freq: Once | INTRAMUSCULAR | Status: AC
  Administered 2022-06-21: 155 [IU] via INTRAMUSCULAR

## 2022-06-21 NOTE — Progress Notes (Signed)
Botox- 200 units x 1 vial Lot: [id number] Expiration: 01/2024 NDC: 8676-7209-47  Bacteriostatic 0.9% Sodium Chloride- 65mL total Lot: [id number] Expiration: 11/25 NDC: 62947-654-65  Dx: K35.465 S/P

## 2022-06-21 NOTE — Progress Notes (Signed)
Consent Form Botulism Toxin Injection For Chronic Migraine  06/21/2022: stable still doing well 03/24/2022: stable 12/15/2021: >60% improvement in migraine freq and severity   Reviewed orally with patient, additionally signature is on file:  Botulism toxin has been approved by the Federal drug administration for treatment of chronic migraine. Botulism toxin does not cure chronic migraine and it may not be effective in some patients.  The administration of botulism toxin is accomplished by injecting a small amount of toxin into the muscles of the neck and head. Dosage must be titrated for each individual. Any benefits resulting from botulism toxin tend to wear off after 3 months with a repeat injection required if benefit is to be maintained. Injections are usually done every 3-4 months with maximum effect peak achieved by about 2 or 3 weeks. Botulism toxin is expensive and you should be sure of what costs you will incur resulting from the injection.  The side effects of botulism toxin use for chronic migraine may include:   -Transient, and usually mild, facial weakness with facial injections  -Transient, and usually mild, head or neck weakness with head/neck injections  -Reduction or loss of forehead facial animation due to forehead muscle weakness  -Eyelid drooping  -Dry eye  -Pain at the site of injection or bruising at the site of injection  -Double vision  -Potential unknown long term risks  Contraindications: You should not have Botox if you are pregnant, nursing, allergic to albumin, have an infection, skin condition, or muscle weakness at the site of the injection, or have myasthenia gravis, Lambert-Eaton syndrome, or ALS.  It is also possible that as with any injection, there may be an allergic reaction or no effect from the medication. Reduced effectiveness after repeated injections is sometimes seen and rarely infection at the injection site may occur. All care will be taken to  prevent these side effects. If therapy is given over a long time, atrophy and wasting in the muscle injected may occur. Occasionally the patient's become refractory to treatment because they develop antibodies to the toxin. In this event, therapy needs to be modified.  I have read the above information and consent to the administration of botulism toxin.    BOTOX PROCEDURE NOTE FOR MIGRAINE HEADACHE    Contraindications and precautions discussed with patient(above). Aseptic procedure was observed and patient tolerated procedure. Procedure performed by Dr. Georgia Dom  The condition has existed for more than 6 months, and pt does not have a diagnosis of ALS, Myasthenia Gravis or Lambert-Eaton Syndrome.  Risks and benefits of injections discussed and pt agrees to proceed with the procedure.  Written consent obtained  These injections are medically necessary. Pt  receives good benefits from these injections. These injections do not cause sedations or hallucinations which the oral therapies may cause.  Description of procedure:  The patient was placed in a sitting position. The standard protocol was used for Botox as follows, with 5 units of Botox injected at each site:   -Procerus muscle, midline injection  -Corrugator muscle, bilateral injection  -Frontalis muscle, bilateral injection, with 2 sites each side, medial injection was performed in the upper one third of the frontalis muscle, in the region vertical from the medial inferior edge of the superior orbital rim. The lateral injection was again in the upper one third of the forehead vertically above the lateral limbus of the cornea, 1.5 cm lateral to the medial injection site.  -Temporalis muscle injection, 4 sites, bilaterally. The first injection was 3  cm above the tragus of the ear, second injection site was 1.5 cm to 3 cm up from the first injection site in line with the tragus of the ear. The third injection site was 1.5-3 cm forward  between the first 2 injection sites. The fourth injection site was 1.5 cm posterior to the second injection site.   -Occipitalis muscle injection, 3 sites, bilaterally. The first injection was done one half way between the occipital protuberance and the tip of the mastoid process behind the ear. The second injection site was done lateral and superior to the first, 1 fingerbreadth from the first injection. The third injection site was 1 fingerbreadth superiorly and medially from the first injection site.  -Cervical paraspinal muscle injection, 2 sites, bilateral knee first injection site was 1 cm from the midline of the cervical spine, 3 cm inferior to the lower border of the occipital protuberance. The second injection site was 1.5 cm superiorly and laterally to the first injection site.  -Trapezius muscle injection was performed at 3 sites, bilaterally. The first injection site was in the upper trapezius muscle halfway between the inflection point of the neck, and the acromion. The second injection site was one half way between the acromion and the first injection site. The third injection was done between the first injection site and the inflection point of the neck.   Will return for repeat injection in 3 months.   200 units of Botox was used, 45 U Botox not injected was wasted. The patient tolerated the procedure well, there were no complications of the above procedure.

## 2022-06-29 ENCOUNTER — Ambulatory Visit: Payer: Medicare Other | Admitting: Neurology

## 2022-07-14 ENCOUNTER — Other Ambulatory Visit: Payer: Self-pay | Admitting: Neurology

## 2022-07-25 ENCOUNTER — Telehealth: Payer: Self-pay | Admitting: Neurology

## 2022-07-25 NOTE — Telephone Encounter (Signed)
error 

## 2022-07-27 ENCOUNTER — Encounter: Payer: Self-pay | Admitting: Neurology

## 2022-07-27 ENCOUNTER — Telehealth: Payer: Self-pay | Admitting: Neurology

## 2022-07-27 ENCOUNTER — Ambulatory Visit (INDEPENDENT_AMBULATORY_CARE_PROVIDER_SITE_OTHER): Payer: 59 | Admitting: Neurology

## 2022-07-27 VITALS — BP 128/83 | HR 84 | Ht 66.0 in | Wt 123.4 lb

## 2022-07-27 DIAGNOSIS — R296 Repeated falls: Secondary | ICD-10-CM | POA: Diagnosis not present

## 2022-07-27 DIAGNOSIS — M6281 Muscle weakness (generalized): Secondary | ICD-10-CM

## 2022-07-27 DIAGNOSIS — R270 Ataxia, unspecified: Secondary | ICD-10-CM

## 2022-07-27 DIAGNOSIS — Z79899 Other long term (current) drug therapy: Secondary | ICD-10-CM

## 2022-07-27 DIAGNOSIS — R202 Paresthesia of skin: Secondary | ICD-10-CM

## 2022-07-27 DIAGNOSIS — R29818 Other symptoms and signs involving the nervous system: Secondary | ICD-10-CM

## 2022-07-27 DIAGNOSIS — R5383 Other fatigue: Secondary | ICD-10-CM

## 2022-07-27 DIAGNOSIS — R55 Syncope and collapse: Secondary | ICD-10-CM

## 2022-07-27 DIAGNOSIS — R251 Tremor, unspecified: Secondary | ICD-10-CM

## 2022-07-27 DIAGNOSIS — G379 Demyelinating disease of central nervous system, unspecified: Secondary | ICD-10-CM

## 2022-07-27 DIAGNOSIS — G8929 Other chronic pain: Secondary | ICD-10-CM

## 2022-07-27 DIAGNOSIS — R292 Abnormal reflex: Secondary | ICD-10-CM

## 2022-07-27 DIAGNOSIS — R442 Other hallucinations: Secondary | ICD-10-CM

## 2022-07-27 DIAGNOSIS — R200 Anesthesia of skin: Secondary | ICD-10-CM

## 2022-07-27 DIAGNOSIS — M542 Cervicalgia: Secondary | ICD-10-CM

## 2022-07-27 DIAGNOSIS — G47419 Narcolepsy without cataplexy: Secondary | ICD-10-CM
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
## 2022-07-27 NOTE — Patient Instructions (Addendum)
Frequent falls: echo, cardiac monitor, narcolepsy panel, send to sleep eval(Evaluate for narcolepsy can we just order the test she was recently seen I believe. Drop attacks. Sleep attacks. Hypnagogic and hypnapmpic attacks), check for MS or strokes MRI brain and c-spine MS protocol wide differential cardiac vs narcolepsy vs seizures(increase meds?) vs strokes vs multiple sclerosis. Will check orthostatics as well, eeg, emg  Orders Placed This Encounter  Procedures   MR BRAIN W WO CONTRAST   MR CERVICAL SPINE W WO CONTRAST   NARCOLEPSY EVALUATION   Lamotrigine level   CBC with Differential/Platelets   Comprehensive metabolic panel   TSH Rfx on Abnormal to Free T4   B12 and Folate Panel   Methylmalonic acid, serum   Vitamin D, 25-hydroxy   Vitamin B1   Vitamin B6   Multiple Myeloma Panel (SPEP&IFE w/QIG)   Copper, serum   Zinc   Ambulatory referral to Sleep Studies   Cardiac event monitor   ECHOCARDIOGRAM COMPLETE BUBBLE STUDY   NCV with EMG(electromyography)   EEG adult    Fall Prevention in the Home, Adult Falls can cause injuries and affect people of all ages. There are many simple things that you can do to make your home safe and to help prevent falls. If you need it, ask for help making these changes. What actions can I take to prevent falls? General information Use good lighting in all rooms. Make sure to: Replace any light bulbs that burn out. Turn on lights if it is dark and use night-lights. Keep items that you use often in easy-to-reach places. Lower the shelves around your home if needed. Move furniture so that there are clear paths around it. Do not keep throw rugs or other things on the floor that can make you trip. If any of your floors are uneven, fix them. Add color or contrast paint or tape to clearly mark and help you see: Grab bars or handrails. First and last steps of staircases. Where the edge of each step is. If you use a ladder or stepladder: Make  sure that it is fully opened. Do not climb a closed ladder. Make sure the sides of the ladder are locked in place. Have someone hold the ladder while you use it. Know where your pets are as you move through your home. What can I do in the bathroom?     Keep the floor dry. Clean up any water that is on the floor right away. Remove soap buildup in the bathtub or shower. Buildup makes bathtubs and showers slippery. Use non-skid mats or decals on the floor of the bathtub or shower. Attach bath mats securely with double-sided, non-slip rug tape. If you need to sit down while you are in the shower, use a non-slip stool. Install grab bars by the toilet and in the bathtub and shower. Do not use towel bars as grab bars. What can I do in the bedroom? Make sure that you have a light by your bed that is easy to reach. Do not use any sheets or blankets on your bed that hang to the floor. Have a firm bench or chair with side arms that you can use for support when you get dressed. What can I do in the kitchen? Clean up any spills right away. If you need to reach something above you, use a sturdy step stool that has a grab bar. Keep electrical cables out of the way. Do not use floor polish or wax that makes floors slippery.  What can I do with my stairs? Do not leave anything on the stairs. Make sure that you have a light switch at the top and the bottom of the stairs. Have them installed if you do not have them. Make sure that there are handrails on both sides of the stairs. Fix handrails that are broken or loose. Make sure that handrails are as long as the staircases. Install non-slip stair treads on all stairs in your home if they do not have carpet. Avoid having throw rugs at the top or bottom of stairs, or secure the rugs with carpet tape to prevent them from moving. Choose a carpet design that does not hide the edge of steps on the stairs. Make sure that carpet is firmly attached to the stairs. Fix  any carpet that is loose or worn. What can I do on the outside of my home? Use bright outdoor lighting. Repair the edges of walkways and driveways and fix any cracks. Clear paths of anything that can make you trip, such as tools or rocks. Add color or contrast paint or tape to clearly mark and help you see high doorway thresholds. Trim any bushes or trees on the main path into your home. Check that handrails are securely fastened and in good repair. Both sides of all steps should have handrails. Install guardrails along the edges of any raised decks or porches. Have leaves, snow, and ice cleared regularly. Use sand, salt, or ice melt on walkways during winter months if you live where there is ice and snow. In the garage, clean up any spills right away, including grease or oil spills. What other actions can I take? Review your medicines with your health care provider. Some medicines can make you confused or feel dizzy. This can increase your chance of falling. Wear closed-toe shoes that fit well and support your feet. Wear shoes that have rubber soles and low heels. Use a cane, walker, scooter, or crutches that help you move around if needed. Talk with your provider about other ways that you can decrease your risk of falls. This may include seeing a physical therapist to learn to do exercises to improve movement and strength. Where to find more information Centers for Disease Control and Prevention, [employer]: StoreMirror.com.cy [employer] on Aging: AquariamTheater.co.nz National Institute on Aging: AquariamTheater.co.nz Contact a health care provider if: You are afraid of falling at home. You feel weak, drowsy, or dizzy at home. You fall at home. Get help right away if you: Lose consciousness or have trouble moving after a fall. Have a fall that causes a head injury. These symptoms may be an emergency. Get help right away. Call 911. Do not wait to see if the symptoms will go away. Do not drive yourself to the  hospital. This information is not intended to replace advice given to you by your health care provider. Make sure you discuss any questions you have with your health care provider. Document Revised: 01/17/2022 Document Reviewed: 01/17/2022 Elsevier Patient Education  [place].

## 2022-07-27 NOTE — Telephone Encounter (Signed)
[employer] medicare/[place] medicaid NPR sent to GI [phone]

## 2022-07-29 LAB — TSH RFX ON ABNORMAL TO FREE T4: TSH: 2.71 u[IU]/mL (ref 0.450–4.500)

## 2022-07-30 LAB — ZINC: Zinc: 57 ug/dL (ref 44–115)

## 2022-08-02 ENCOUNTER — Telehealth: Payer: Self-pay | Admitting: *Deleted

## 2022-08-02 NOTE — Telephone Encounter (Signed)
I called pt and relayed the lab results that have  come back so far.  Her Vit B12 and Vit D are low and Dr. [name] would like to call in prescriptions for those.  The lamotrigine  was practically 0.  She said she had forgotten to take, but has restarted it. I told her that will relay this to Dr. [name], when other labs come back will send in rx at that time.  She appreciated call back and verbalized understanding.

## 2022-08-02 NOTE — Telephone Encounter (Signed)
-----   Message from Melvenia Beam, MD sent at 08/01/2022  7:31 PM EST ----- Vitamin D and b12 are very loww. I'd like to call in a prescription for her to start for both. Also her lamictal level is practically 0 is she taking her meds? Still awaiting more labs but I'd like you to discuss this with her in advance thanks if she agres I can send in some vitamins for her b12 and vitamin d and make sure she Is taking her lamictal thanks

## 2022-08-03 ENCOUNTER — Other Ambulatory Visit: Payer: Self-pay | Admitting: Neurology

## 2022-08-03 ENCOUNTER — Ambulatory Visit: Payer: Medicare Other | Admitting: Infectious Disease

## 2022-08-03 DIAGNOSIS — E538 Deficiency of other specified B group vitamins: Secondary | ICD-10-CM

## 2022-08-03 DIAGNOSIS — E559 Vitamin D deficiency, unspecified: Secondary | ICD-10-CM

## 2022-08-03 LAB — B12 AND FOLATE PANEL
Folate: 13.9 ng/mL (ref >3.0)
Vitamin B-12: 269 pg/mL (ref 232–1245)

## 2022-08-03 LAB — CBC WITH DIFFERENTIAL/PLATELET
Basophils Absolute: 0.1 10*3/uL (ref 0.0–0.2)
Basos: 1 %
EOS (ABSOLUTE): 0.1 10*3/uL (ref 0.0–0.4)
Eos: 2 %
Hematocrit: 46.7 % — ABNORMAL HIGH (ref 34.0–46.6)
Hemoglobin: 15.5 g/dL (ref 11.1–15.9)
Immature Grans (Abs): 0 10*3/uL (ref 0.0–0.1)
Immature Granulocytes: 0 %
Lymphocytes Absolute: 1.9 10*3/uL (ref 0.7–3.1)
Lymphs: 29 %
MCH: 31.4 pg (ref 26.6–33.0)
MCHC: 33.2 g/dL (ref 31.5–35.7)
MCV: 95 fL (ref 79–97)
Monocytes Absolute: 0.4 10*3/uL (ref 0.1–0.9)
Monocytes: 5 %
Neutrophils Absolute: 4.2 10*3/uL (ref 1.4–7.0)
Neutrophils: 63 %
Platelets: 251 10*3/uL (ref 150–450)
RBC: 4.93 x10E6/uL (ref 3.77–5.28)
RDW: 12 % (ref 11.7–15.4)
WBC: 6.6 10*3/uL (ref 3.4–10.8)

## 2022-08-03 LAB — LAMOTRIGINE LEVEL: Lamotrigine Lvl: <1 ug/mL — ABNORMAL LOW (ref 2.0–20.0)

## 2022-08-03 LAB — COMPREHENSIVE METABOLIC PANEL
ALT: 15 IU/L (ref 0–32)
AST: 18 IU/L (ref 0–40)
Albumin/Globulin Ratio: 2.3 — ABNORMAL HIGH (ref 1.2–2.2)
Albumin: 5.2 g/dL — ABNORMAL HIGH (ref 3.9–4.9)
Alkaline Phosphatase: 80 IU/L (ref 44–121)
BUN/Creatinine Ratio: 19 (ref 9–23)
BUN: 12 mg/dL (ref 6–24)
Bilirubin Total: 0.6 mg/dL (ref 0.0–1.2)
CO2: 22 mmol/L (ref 20–29)
Calcium: 9.8 mg/dL (ref 8.7–10.2)
Chloride: 102 mmol/L (ref 96–106)
Creatinine, Ser: 0.62 mg/dL (ref 0.57–1.00)
Globulin, Total: 2.3 g/dL (ref 1.5–4.5)
Glucose: 107 mg/dL — ABNORMAL HIGH (ref 70–99)
Potassium: 3.8 mmol/L (ref 3.5–5.2)
Sodium: 139 mmol/L (ref 134–144)
Total Protein: 7.5 g/dL (ref 6.0–8.5)
eGFR: 114 mL/min/{1.73_m2} (ref >59)

## 2022-08-03 LAB — MULTIPLE MYELOMA PANEL, SERUM
Albumin SerPl Elph-Mcnc: 4.4 g/dL (ref 2.9–4.4)
Albumin/Glob SerPl: 1.5 (ref 0.7–1.7)
Alpha 1: 0.3 g/dL (ref 0.0–0.4)
Alpha2 Glob SerPl Elph-Mcnc: 0.7 g/dL (ref 0.4–1.0)
B-Globulin SerPl Elph-Mcnc: 1.1 g/dL (ref 0.7–1.3)
Gamma Glob SerPl Elph-Mcnc: 1 g/dL (ref 0.4–1.8)
Globulin, Total: 3.1 g/dL (ref 2.2–3.9)
IgA/Immunoglobulin A, Serum: 150 mg/dL (ref 87–352)
IgG (Immunoglobin G), Serum: 832 mg/dL (ref 586–1602)
IgM (Immunoglobulin M), Srm: 116 mg/dL (ref 26–217)

## 2022-08-03 LAB — VITAMIN D 25 HYDROXY (VIT D DEFICIENCY, FRACTURES): Vit D, 25-Hydroxy: 8.8 ng/mL — ABNORMAL LOW (ref 30.0–100.0)

## 2022-08-03 LAB — COPPER, SERUM: Copper: 102 ug/dL (ref 80–158)

## 2022-08-03 LAB — NARCOLEPSY EVALUATION
DQA1*01:02: POSITIVE
DQB1*06:02: NEGATIVE

## 2022-08-03 LAB — VITAMIN B1: Thiamine: 125 nmol/L (ref 66.5–200.0)

## 2022-08-03 LAB — METHYLMALONIC ACID, SERUM: Methylmalonic Acid: 454 nmol/L — ABNORMAL HIGH (ref 0–378)

## 2022-08-03 LAB — VITAMIN B6: Vitamin B6: 6.6 ug/L (ref 3.4–65.2)

## 2022-08-03 MED ORDER — VITAMIN D (ERGOCALCIFEROL) 1.25 MG (50000 UNIT) PO CAPS: 50000.0000 [IU] | ORAL_CAPSULE | ORAL | 2 refills | Status: DC

## 2022-08-03 MED ORDER — FOLIC ACID-VIT B6-VIT B12 2.5-25-1 MG PO TABS: 1.0000 | ORAL_TABLET | Freq: Every day | ORAL | 3 refills | Status: DC

## 2022-08-03 NOTE — Telephone Encounter (Signed)
Called pt and LMVM for her that b12 and vit d called into pharmacy for her.  Gave her reminder about taking lamictal, if not consistent can have a seizure then can not drive for 6 months.  Will call her when other lab results come back.

## 2022-08-03 NOTE — Telephone Encounter (Signed)
I sent in scripts. Remind her even missing one dose of lamictal can cause breakthrough seizure then she can't drive for 6 months per [place] law so don;t miss that medication thanks

## 2022-08-04 ENCOUNTER — Ambulatory Visit (INDEPENDENT_AMBULATORY_CARE_PROVIDER_SITE_OTHER): Payer: 59 | Admitting: Neurology

## 2022-08-04 DIAGNOSIS — R5383 Other fatigue: Secondary | ICD-10-CM

## 2022-08-04 DIAGNOSIS — M6281 Muscle weakness (generalized): Secondary | ICD-10-CM

## 2022-08-04 DIAGNOSIS — R442 Other hallucinations: Secondary | ICD-10-CM

## 2022-08-04 DIAGNOSIS — G47419 Narcolepsy without cataplexy: Secondary | ICD-10-CM

## 2022-08-04 DIAGNOSIS — G379 Demyelinating disease of central nervous system, unspecified: Secondary | ICD-10-CM

## 2022-08-04 DIAGNOSIS — R200 Anesthesia of skin: Secondary | ICD-10-CM

## 2022-08-04 DIAGNOSIS — R55 Syncope and collapse: Secondary | ICD-10-CM

## 2022-08-04 DIAGNOSIS — Z79899 Other long term (current) drug therapy: Secondary | ICD-10-CM

## 2022-08-04 DIAGNOSIS — R292 Abnormal reflex: Secondary | ICD-10-CM

## 2022-08-04 DIAGNOSIS — R251 Tremor, unspecified: Secondary | ICD-10-CM

## 2022-08-04 DIAGNOSIS — R296 Repeated falls: Secondary | ICD-10-CM

## 2022-08-04 DIAGNOSIS — R29818 Other symptoms and signs involving the nervous system: Secondary | ICD-10-CM

## 2022-08-04 DIAGNOSIS — R202 Paresthesia of skin: Secondary | ICD-10-CM

## 2022-08-04 DIAGNOSIS — R270 Ataxia, unspecified: Secondary | ICD-10-CM

## 2022-08-04 DIAGNOSIS — G8929 Other chronic pain: Secondary | ICD-10-CM

## 2022-08-04 NOTE — Procedures (Addendum)
    History:  42 year old woman with history of seizure  EEG classification:  Awake and asleep  Description of the recording: The background rhythms of this recording consists of a fairly well modulated medium amplitude background activity of 10 Hz. As the record progresses, the patient initially is in the waking state, but appears to enter the early stage II sleep during the recording, with rudimentary sleep spindles and vertex sharp wave activity seen. During the wakeful state, photic stimulation is performed, and no abnormal responses were seen. Hyperventilation was not performed. No epileptiform discharges seen during this recording. There was no focal slowing.   Abnormality: None   Impression: This is a normal EEG recording in the waking and sleeping state. No evidence of interictal epileptiform discharges. Normal EEGs, however, do not rule out epilepsy.    Alric Ran, MD [name]

## 2022-08-08 ENCOUNTER — Other Ambulatory Visit: Payer: 59

## 2022-08-08 ENCOUNTER — Other Ambulatory Visit: Payer: Self-pay

## 2022-08-08 ENCOUNTER — Encounter (INDEPENDENT_AMBULATORY_CARE_PROVIDER_SITE_OTHER): Payer: Self-pay

## 2022-08-08 DIAGNOSIS — Z006 Encounter for examination for normal comparison and control in clinical research program: Secondary | ICD-10-CM

## 2022-08-08 MED ORDER — STUDY - MK-8591A-052 - BICTEGRAVIR/EMTRICITABINE/TENOFOVIR ALAFENAMIDE (BIKTARVY) 50-200-25 MG OR PLACEBO TABLET (PI-VAN DAM): 1.0000 | ORAL_TABLET | Freq: Every day | ORAL | 0 refills | Status: DC

## 2022-08-08 MED ORDER — STUDY - MK-8591A-052 - MK-8591A 100/0.25 MG OR PLACEBO TABLET (PI-VAN DAM): 1.0000 | ORAL_TABLET | Freq: Every day | ORAL | 0 refills | Status: DC

## 2022-08-08 NOTE — Research (Signed)
Individual seen for Week 24 for [id number], A Phase 3, Randomized, Active-Controlled, Double-Blind Clinical Study to Evaluate a Switch to Doravirine/Islatravir (DOR/ISL 100 mg/0.25 mg) Once-Daily in Participants With HIV-1 Who Are Virologically Suppressed on Bictegravir/Emtricitabine/Tenofovir Alafenamide (BIC/FTC/TAF). Overall individual doings well. All procedures carried out per protocol.Study medications dispensed. Plan to see participant again in 12 weeks.

## 2022-08-09 ENCOUNTER — Ambulatory Visit: Payer: 59 | Attending: Neurology

## 2022-08-09 DIAGNOSIS — R251 Tremor, unspecified: Secondary | ICD-10-CM

## 2022-08-09 DIAGNOSIS — R55 Syncope and collapse: Secondary | ICD-10-CM | POA: Diagnosis not present

## 2022-08-09 DIAGNOSIS — R202 Paresthesia of skin: Secondary | ICD-10-CM

## 2022-08-09 DIAGNOSIS — R29818 Other symptoms and signs involving the nervous system: Secondary | ICD-10-CM

## 2022-08-10 ENCOUNTER — Telehealth: Payer: Self-pay | Admitting: *Deleted

## 2022-08-10 NOTE — Telephone Encounter (Signed)
-----   Message from Melvenia Beam, MD sent at 08/09/2022  1:56 PM EDT ----- She didn't read the results, eeg normal please call and tell ehr thanks

## 2022-08-10 NOTE — Telephone Encounter (Signed)
Spoke to patient gave EEG results Pt expressed understanding and thanked me for calling

## 2022-08-11 ENCOUNTER — Encounter: Payer: 59 | Admitting: Neurology

## 2022-08-15 ENCOUNTER — Other Ambulatory Visit: Payer: 59

## 2022-08-17 ENCOUNTER — Ambulatory Visit
Admission: RE | Admit: 2022-08-17 | Discharge: 2022-08-17 | Disposition: A | Discharge status: Home / Self Care | Payer: 59 | Source: Ambulatory Visit | Attending: Neurology | Admitting: Neurology

## 2022-08-17 DIAGNOSIS — G379 Demyelinating disease of central nervous system, unspecified: Secondary | ICD-10-CM

## 2022-08-17 DIAGNOSIS — R202 Paresthesia of skin: Secondary | ICD-10-CM

## 2022-08-17 DIAGNOSIS — R200 Anesthesia of skin: Secondary | ICD-10-CM

## 2022-08-17 DIAGNOSIS — R29818 Other symptoms and signs involving the nervous system: Secondary | ICD-10-CM

## 2022-08-17 DIAGNOSIS — R296 Repeated falls: Secondary | ICD-10-CM

## 2022-08-17 DIAGNOSIS — R270 Ataxia, unspecified: Secondary | ICD-10-CM

## 2022-08-17 DIAGNOSIS — M6281 Muscle weakness (generalized): Secondary | ICD-10-CM

## 2022-08-17 DIAGNOSIS — R251 Tremor, unspecified: Secondary | ICD-10-CM

## 2022-08-17 DIAGNOSIS — R292 Abnormal reflex: Secondary | ICD-10-CM

## 2022-08-17 DIAGNOSIS — G8929 Other chronic pain: Secondary | ICD-10-CM

## 2022-08-17 MED ORDER — GADOPICLENOL 0.5 MMOL/ML IV SOLN
5.0000 mL | Freq: Once | INTRAVENOUS | Status: AC | PRN
  Administered 2022-08-17: 5 mL via INTRAVENOUS

## 2022-08-21 ENCOUNTER — Other Ambulatory Visit: Payer: Self-pay | Admitting: Neurology

## 2022-08-23 ENCOUNTER — Ambulatory Visit (HOSPITAL_COMMUNITY): Payer: 59 | Attending: Neurology

## 2022-08-23 DIAGNOSIS — R29818 Other symptoms and signs involving the nervous system: Secondary | ICD-10-CM | POA: Insufficient documentation

## 2022-08-23 DIAGNOSIS — R296 Repeated falls: Secondary | ICD-10-CM | POA: Diagnosis not present

## 2022-08-23 DIAGNOSIS — R55 Syncope and collapse: Secondary | ICD-10-CM | POA: Diagnosis not present

## 2022-08-23 DIAGNOSIS — R202 Paresthesia of skin: Secondary | ICD-10-CM | POA: Insufficient documentation

## 2022-08-23 DIAGNOSIS — R251 Tremor, unspecified: Secondary | ICD-10-CM | POA: Insufficient documentation

## 2022-08-24 ENCOUNTER — Ambulatory Visit (INDEPENDENT_AMBULATORY_CARE_PROVIDER_SITE_OTHER): Payer: 59 | Admitting: Neurology

## 2022-08-24 ENCOUNTER — Telehealth: Payer: Self-pay | Admitting: Neurology

## 2022-08-24 ENCOUNTER — Encounter: Payer: Self-pay | Admitting: Neurology

## 2022-08-24 ENCOUNTER — Other Ambulatory Visit (HOSPITAL_COMMUNITY): Payer: Self-pay

## 2022-08-24 VITALS — BP 138/96 | HR 87 | Ht 66.0 in | Wt 127.4 lb

## 2022-08-24 DIAGNOSIS — G47411 Narcolepsy with cataplexy: Secondary | ICD-10-CM | POA: Diagnosis not present

## 2022-08-24 DIAGNOSIS — G4753 Recurrent isolated sleep paralysis: Secondary | ICD-10-CM

## 2022-08-24 DIAGNOSIS — F518 Other sleep disorders not due to a substance or known physiological condition: Secondary | ICD-10-CM | POA: Insufficient documentation

## 2022-08-24 DIAGNOSIS — G43709 Chronic migraine without aura, not intractable, without status migrainosus: Secondary | ICD-10-CM

## 2022-08-24 DIAGNOSIS — R442 Other hallucinations: Secondary | ICD-10-CM

## 2022-08-24 LAB — ECHOCARDIOGRAM COMPLETE BUBBLE STUDY
Area-P 1/2: 3.21 cm2
S' Lateral: 2.4 cm

## 2022-08-24 NOTE — Telephone Encounter (Signed)
Benefit Verification [id number] Submitted! Botox One

## 2022-08-24 NOTE — Patient Instructions (Signed)
Healthy Living: Sleep In this video, you will learn why sleep is an important part of a healthy lifestyle. To view the content, go to this web address: [email or web address]  This video will expire on: 05/11/2024. If you need access to this video following this date, please reach out to the healthcare provider who assigned it to you. This information is not intended to replace advice given to you by your health care provider. Make sure you discuss any questions you have with your health care provider. Elsevier Patient Education  2023 Elsevier Inc.  

## 2022-08-24 NOTE — Telephone Encounter (Signed)
Pharmacy Patient Advocate Encounter   Received notification from [place] that prior authorization for Botox 200UNIT solution is required/requested.  PA submitted on 08/24/2022 to (ins) [employer] via CoverMyMeds Key or (Medicaid) confirmation # [id number]  Status is pending
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
## 2022-08-24 NOTE — Telephone Encounter (Signed)
New Botox auth needed before 09/13/22 appt, current auth expired 09/07/22.

## 2022-08-26 ENCOUNTER — Other Ambulatory Visit (HOSPITAL_COMMUNITY): Payer: Self-pay

## 2022-08-26 NOTE — Telephone Encounter (Signed)
Pharmacy Patient Advocate Encounter- Botox BIV-Pharmacy Benefit:  PA was submitted to [employer] and has been approved through: 11/24/2022 Authorization# PA Case ID #: [id number]  Please send prescription to Specialty Pharmacy: [facility]: [phone]  Estimated Copay is: Zero  Patient Is Not eligible for Botox Copay Card, which will make patient's copay as little as zero. Copay card will be provided to pharmacy.

## 2022-08-29 ENCOUNTER — Ambulatory Visit: Payer: 59 | Admitting: Infectious Disease

## 2022-08-30 ENCOUNTER — Other Ambulatory Visit (HOSPITAL_COMMUNITY): Payer: Self-pay

## 2022-08-30 ENCOUNTER — Other Ambulatory Visit: Payer: Self-pay

## 2022-09-01 ENCOUNTER — Encounter: Payer: Self-pay | Admitting: Neurology

## 2022-09-01 ENCOUNTER — Ambulatory Visit: Payer: 59 | Admitting: Neurology

## 2022-09-05 ENCOUNTER — Telehealth: Payer: Self-pay | Admitting: Neurology

## 2022-09-05 NOTE — Telephone Encounter (Signed)
NPSG/MSLT- [employer] medicare & medicaid no auth req.  Patient is scheduled for 11/07/22 at 9 pm and all day on 11/08/2022.  Mailed packet to the patient.   Patient will need to taper of Adderall, Lioresal, Klonopin, Buprenorphine, Gabapentin, Lamictal & Voltaren 2 weeks prior to the sleep study
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
## 2022-09-08 ENCOUNTER — Other Ambulatory Visit: Payer: Self-pay

## 2022-09-08 ENCOUNTER — Ambulatory Visit (INDEPENDENT_AMBULATORY_CARE_PROVIDER_SITE_OTHER): Payer: 59 | Admitting: Infectious Disease

## 2022-09-08 ENCOUNTER — Encounter: Payer: Self-pay | Admitting: Infectious Disease

## 2022-09-08 VITALS — BP 155/83 | HR 84 | Temp 98.0°F | Ht 66.0 in | Wt 130.0 lb

## 2022-09-08 DIAGNOSIS — B20 Human immunodeficiency virus [HIV] disease: Secondary | ICD-10-CM | POA: Diagnosis not present

## 2022-09-08 DIAGNOSIS — E782 Mixed hyperlipidemia: Secondary | ICD-10-CM | POA: Diagnosis not present

## 2022-09-08 DIAGNOSIS — R569 Unspecified convulsions: Secondary | ICD-10-CM

## 2022-09-08 DIAGNOSIS — R41840 Attention and concentration deficit: Secondary | ICD-10-CM

## 2022-09-08 DIAGNOSIS — R140 Abdominal distension (gaseous): Secondary | ICD-10-CM

## 2022-09-08 DIAGNOSIS — Z23 Encounter for immunization: Secondary | ICD-10-CM

## 2022-09-08 HISTORY — DX: Abdominal distension (gaseous): R14.0

## 2022-09-08 NOTE — Addendum Note (Signed)
Addended by: Philippa Chester on: 09/08/2022 04:48 PM   Modules accepted: Orders

## 2022-09-08 NOTE — Patient Instructions (Signed)
[name] needs her 2nd hepatitis B vaccine in one month so she needs an appt for that shot

## 2022-09-13 ENCOUNTER — Ambulatory Visit (INDEPENDENT_AMBULATORY_CARE_PROVIDER_SITE_OTHER): Payer: 59 | Admitting: Neurology

## 2022-09-13 DIAGNOSIS — G43709 Chronic migraine without aura, not intractable, without status migrainosus: Secondary | ICD-10-CM | POA: Diagnosis not present

## 2022-09-13 MED ORDER — ONABOTULINUMTOXINA 200 UNITS IJ SOLR
155.0000 [IU] | Freq: Once | INTRAMUSCULAR | Status: AC
  Administered 2022-09-13: 155 [IU] via INTRAMUSCULAR

## 2022-09-13 NOTE — Progress Notes (Signed)
Consent Form Botulism Toxin Injection For Chronic Migraine      09/13/2022: stable 06/21/2022: stable still doing well 03/24/2022: stable 12/15/2021: >60% improvement in migraine freq and severity   Reviewed orally with patient, additionally signature is on file:  Botulism toxin has been approved by the Federal drug administration for treatment of chronic migraine. Botulism toxin does not cure chronic migraine and it may not be effective in some patients.  The administration of botulism toxin is accomplished by injecting a small amount of toxin into the muscles of the neck and head. Dosage must be titrated for each individual. Any benefits resulting from botulism toxin tend to wear off after 3 months with a repeat injection required if benefit is to be maintained. Injections are usually done every 3-4 months with maximum effect peak achieved by about 2 or 3 weeks. Botulism toxin is expensive and you should be sure of what costs you will incur resulting from the injection.  The side effects of botulism toxin use for chronic migraine may include:   -Transient, and usually mild, facial weakness with facial injections  -Transient, and usually mild, head or neck weakness with head/neck injections  -Reduction or loss of forehead facial animation due to forehead muscle weakness  -Eyelid drooping  -Dry eye  -Pain at the site of injection or bruising at the site of injection  -Double vision  -Potential unknown long term risks  Contraindications: You should not have Botox if you are pregnant, nursing, allergic to albumin, have an infection, skin condition, or muscle weakness at the site of the injection, or have myasthenia gravis, Lambert-Eaton syndrome, or ALS.  It is also possible that as with any injection, there may be an allergic reaction or no effect from the medication. Reduced effectiveness after repeated injections is sometimes seen and rarely infection at the injection site may occur. All  care will be taken to prevent these side effects. If therapy is given over a long time, atrophy and wasting in the muscle injected may occur. Occasionally the patient's become refractory to treatment because they develop antibodies to the toxin. In this event, therapy needs to be modified.  I have read the above information and consent to the administration of botulism toxin.    BOTOX PROCEDURE NOTE FOR MIGRAINE HEADACHE    Contraindications and precautions discussed with patient(above). Aseptic procedure was observed and patient tolerated procedure. Procedure performed by Dr. Artemio Aly  The condition has existed for more than 6 months, and pt does not have a diagnosis of ALS, Myasthenia Gravis or Lambert-Eaton Syndrome.  Risks and benefits of injections discussed and pt agrees to proceed with the procedure.  Written consent obtained  These injections are medically necessary. Pt  receives good benefits from these injections. These injections do not cause sedations or hallucinations which the oral therapies may cause.  Description of procedure:  The patient was placed in a sitting position. The standard protocol was used for Botox as follows, with 5 units of Botox injected at each site:   -Procerus muscle, midline injection  -Corrugator muscle, bilateral injection  -Frontalis muscle, bilateral injection, with 2 sites each side, medial injection was performed in the upper one third of the frontalis muscle, in the region vertical from the medial inferior edge of the superior orbital rim. The lateral injection was again in the upper one third of the forehead vertically above the lateral limbus of the cornea, 1.5 cm lateral to the medial injection site.  -Temporalis muscle injection, 4 sites,  bilaterally. The first injection was 3 cm above the tragus of the ear, second injection site was 1.5 cm to 3 cm up from the first injection site in line with the tragus of the ear. The third injection  site was 1.5-3 cm forward between the first 2 injection sites. The fourth injection site was 1.5 cm posterior to the second injection site.   -Occipitalis muscle injection, 3 sites, bilaterally. The first injection was done one half way between the occipital protuberance and the tip of the mastoid process behind the ear. The second injection site was done lateral and superior to the first, 1 fingerbreadth from the first injection. The third injection site was 1 fingerbreadth superiorly and medially from the first injection site.  -Cervical paraspinal muscle injection, 2 sites, bilateral knee first injection site was 1 cm from the midline of the cervical spine, 3 cm inferior to the lower border of the occipital protuberance. The second injection site was 1.5 cm superiorly and laterally to the first injection site.  -Trapezius muscle injection was performed at 3 sites, bilaterally. The first injection site was in the upper trapezius muscle halfway between the inflection point of the neck, and the acromion. The second injection site was one half way between the acromion and the first injection site. The third injection was done between the first injection site and the inflection point of the neck.   Will return for repeat injection in 3 months.   200 units of Botox was used, 45 U Botox not injected was wasted. The patient tolerated the procedure well, there were no complications of the above procedure.

## 2022-09-13 NOTE — Progress Notes (Signed)
Botox consent signed  Botox- 200 units x 1 vial Lot: [id number] Expiration: 10/2024 NDC: 4540-9811-91  Bacteriostatic 0.9% Sodium Chloride- 4 mL total Lot: [id number] Expiration: 11/25 NDC: 21308-657-84  Dx: O96.295 S/P  Witnessed by April J RN

## 2022-10-07 ENCOUNTER — Other Ambulatory Visit: Payer: Self-pay

## 2022-10-07 ENCOUNTER — Ambulatory Visit (INDEPENDENT_AMBULATORY_CARE_PROVIDER_SITE_OTHER): Payer: 59

## 2022-10-07 DIAGNOSIS — Z23 Encounter for immunization: Secondary | ICD-10-CM | POA: Diagnosis not present

## 2022-10-11 ENCOUNTER — Other Ambulatory Visit: Payer: Self-pay | Admitting: Neurology

## 2022-10-11 DIAGNOSIS — G43709 Chronic migraine without aura, not intractable, without status migrainosus: Secondary | ICD-10-CM

## 2022-11-02 ENCOUNTER — Encounter (INDEPENDENT_AMBULATORY_CARE_PROVIDER_SITE_OTHER): Payer: Self-pay

## 2022-11-02 ENCOUNTER — Other Ambulatory Visit: Payer: Self-pay

## 2022-11-02 DIAGNOSIS — Z006 Encounter for examination for normal comparison and control in clinical research program: Secondary | ICD-10-CM

## 2022-11-02 MED ORDER — STUDY - MK-8591A-052 - BICTEGRAVIR/EMTRICITABINE/TENOFOVIR ALAFENAMIDE (BIKTARVY) 50-200-25 MG OR PLACEBO TABLET (PI-VAN DAM): 1.0000 | ORAL_TABLET | Freq: Every day | ORAL | 0 refills | Status: DC

## 2022-11-02 MED ORDER — STUDY - MK-8591A-052 - MK-8591A 100/0.25 MG OR PLACEBO TABLET (PI-VAN DAM): 1.0000 | ORAL_TABLET | Freq: Every day | ORAL | 0 refills | Status: DC

## 2022-11-02 NOTE — Research (Signed)
Individual seen for Week 36 for [id number], A Phase 3, Randomized, Active-Controlled, Double-Blind Clinical Study to Evaluate a Switch to Doravirine/Islatravir (DOR/ISL 100 mg/0.25 mg) Once-Daily in Participants With HIV-1 Who Are Virologically Suppressed on Bictegravir/Emtricitabine/Tenofovir Alafenamide (BIC/FTC/TAF). Overall individual is doing well. All procedures carried out per protocol. Study medications dispensed. Plan to see participant again in 12 weeks.

## 2022-11-07 MED ORDER — BOTOX 200 UNITS IJ SOLR: INTRAMUSCULAR | 3 refills | Status: AC

## 2022-11-07 NOTE — Telephone Encounter (Addendum)
Initiated new auth via [employer], received instant approval. Auth: [id number] (11/07/22-11/07/23). Also received pharmacy auth via [facility]: [id number] (11/07/22-02/07/23).  [facility] is preferred pharmacy for [facility], can you send rx there please? Thank you!

## 2022-11-07 NOTE — Addendum Note (Signed)
Addended by: Bertram Savin on: 11/07/2022 11:15 AM   Modules accepted: Orders

## 2022-11-07 NOTE — Telephone Encounter (Signed)
Botox Rx sent to [facility].

## 2022-11-16 ENCOUNTER — Other Ambulatory Visit: Payer: Self-pay | Admitting: Neurology

## 2022-11-16 ENCOUNTER — Other Ambulatory Visit (HOSPITAL_COMMUNITY): Payer: Self-pay

## 2022-11-16 DIAGNOSIS — G43709 Chronic migraine without aura, not intractable, without status migrainosus: Secondary | ICD-10-CM

## 2022-11-17 ENCOUNTER — Other Ambulatory Visit (HOSPITAL_COMMUNITY): Payer: Self-pay

## 2022-11-18 ENCOUNTER — Other Ambulatory Visit (HOSPITAL_COMMUNITY): Payer: Self-pay

## 2022-11-19 ENCOUNTER — Other Ambulatory Visit (HOSPITAL_COMMUNITY): Payer: Self-pay

## 2022-12-06 ENCOUNTER — Encounter: Payer: Self-pay | Admitting: Neurology

## 2022-12-06 ENCOUNTER — Ambulatory Visit (INDEPENDENT_AMBULATORY_CARE_PROVIDER_SITE_OTHER): Payer: 59 | Admitting: Neurology

## 2022-12-06 DIAGNOSIS — G43009 Migraine without aura, not intractable, without status migrainosus: Secondary | ICD-10-CM

## 2022-12-06 DIAGNOSIS — G43709 Chronic migraine without aura, not intractable, without status migrainosus: Secondary | ICD-10-CM

## 2022-12-06 MED ORDER — ONABOTULINUMTOXINA 200 UNITS IJ SOLR
155.0000 [IU] | INTRAMUSCULAR | Status: AC
  Administered 2022-12-06: 155 [IU] via INTRAMUSCULAR

## 2022-12-06 NOTE — Telephone Encounter (Signed)
Per Dr. Lucia Gaskins - Called multiple times yesterday and today ("not accepting cal") have texted and mycharted, I need to move her botox to end of day or tomorrow can't do a 1pm today

## 2022-12-06 NOTE — Progress Notes (Signed)
Botox 200 units x 1 vial  LOT #: [id number] NDC: 4540-9811-91 EXP: 01/2025  SP  Bacteriostatic 0.9% Sodium Chloride  LOT #: [id number] NDC: 6213-0865-78 EXP: 03/30/2024   Witnessed by Toma Copier

## 2022-12-06 NOTE — Telephone Encounter (Signed)
Noted, thank you

## 2022-12-07 NOTE — Progress Notes (Signed)
Consent Form Botulism Toxin Injection For Chronic Migraine     12/07/2022: still doing well, stable, >60% improved in freq and severity of all headaches and migraines 09/13/2022: stable 06/21/2022: stable still doing well 03/24/2022: stable 12/15/2021: >60% improvement in migraine freq and severity   Reviewed orally with patient, additionally signature is on file:  Botulism toxin has been approved by the Federal drug administration for treatment of chronic migraine. Botulism toxin does not cure chronic migraine and it may not be effective in some patients.  The administration of botulism toxin is accomplished by injecting a small amount of toxin into the muscles of the neck and head. Dosage must be titrated for each individual. Any benefits resulting from botulism toxin tend to wear off after 3 months with a repeat injection required if benefit is to be maintained. Injections are usually done every 3-4 months with maximum effect peak achieved by about 2 or 3 weeks. Botulism toxin is expensive and you should be sure of what costs you will incur resulting from the injection.  The side effects of botulism toxin use for chronic migraine may include:   -Transient, and usually mild, facial weakness with facial injections  -Transient, and usually mild, head or neck weakness with head/neck injections  -Reduction or loss of forehead facial animation due to forehead muscle weakness  -Eyelid drooping  -Dry eye  -Pain at the site of injection or bruising at the site of injection  -Double vision  -Potential unknown long term risks  Contraindications: You should not have Botox if you are pregnant, nursing, allergic to albumin, have an infection, skin condition, or muscle weakness at the site of the injection, or have myasthenia gravis, Lambert-Eaton syndrome, or ALS.  It is also possible that as with any injection, there may be an allergic reaction or no effect from the medication. Reduced effectiveness  after repeated injections is sometimes seen and rarely infection at the injection site may occur. All care will be taken to prevent these side effects. If therapy is given over a long time, atrophy and wasting in the muscle injected may occur. Occasionally the patient's become refractory to treatment because they develop antibodies to the toxin. In this event, therapy needs to be modified.  I have read the above information and consent to the administration of botulism toxin.    BOTOX PROCEDURE NOTE FOR MIGRAINE HEADACHE    Contraindications and precautions discussed with patient(above). Aseptic procedure was observed and patient tolerated procedure. Procedure performed by Dr. Artemio Aly  The condition has existed for more than 6 months, and pt does not have a diagnosis of ALS, Myasthenia Gravis or Lambert-Eaton Syndrome.  Risks and benefits of injections discussed and pt agrees to proceed with the procedure.  Written consent obtained  These injections are medically necessary. Pt  receives good benefits from these injections. These injections do not cause sedations or hallucinations which the oral therapies may cause.  Description of procedure:  The patient was placed in a sitting position. The standard protocol was used for Botox as follows, with 5 units of Botox injected at each site:   -Procerus muscle, midline injection  -Corrugator muscle, bilateral injection  -Frontalis muscle, bilateral injection, with 2 sites each side, medial injection was performed in the upper one third of the frontalis muscle, in the region vertical from the medial inferior edge of the superior orbital rim. The lateral injection was again in the upper one third of the forehead vertically above the lateral limbus of the  cornea, 1.5 cm lateral to the medial injection site.  -Temporalis muscle injection, 4 sites, bilaterally. The first injection was 3 cm above the tragus of the ear, second injection site was 1.5  cm to 3 cm up from the first injection site in line with the tragus of the ear. The third injection site was 1.5-3 cm forward between the first 2 injection sites. The fourth injection site was 1.5 cm posterior to the second injection site.   -Occipitalis muscle injection, 3 sites, bilaterally. The first injection was done one half way between the occipital protuberance and the tip of the mastoid process behind the ear. The second injection site was done lateral and superior to the first, 1 fingerbreadth from the first injection. The third injection site was 1 fingerbreadth superiorly and medially from the first injection site.  -Cervical paraspinal muscle injection, 2 sites, bilateral knee first injection site was 1 cm from the midline of the cervical spine, 3 cm inferior to the lower border of the occipital protuberance. The second injection site was 1.5 cm superiorly and laterally to the first injection site.  -Trapezius muscle injection was performed at 3 sites, bilaterally. The first injection site was in the upper trapezius muscle halfway between the inflection point of the neck, and the acromion. The second injection site was one half way between the acromion and the first injection site. The third injection was done between the first injection site and the inflection point of the neck.   Will return for repeat injection in 3 months.   200 units of Botox was used, 45 U Botox not injected was wasted. The patient tolerated the procedure well, there were no complications of the above procedure.

## 2022-12-22 ENCOUNTER — Encounter: Payer: 59 | Admitting: Neurology

## 2022-12-26 ENCOUNTER — Ambulatory Visit (INDEPENDENT_AMBULATORY_CARE_PROVIDER_SITE_OTHER): Payer: 59 | Admitting: Neurology

## 2022-12-26 ENCOUNTER — Ambulatory Visit (INDEPENDENT_AMBULATORY_CARE_PROVIDER_SITE_OTHER): Payer: Self-pay | Admitting: Neurology

## 2022-12-26 DIAGNOSIS — R200 Anesthesia of skin: Secondary | ICD-10-CM

## 2022-12-26 DIAGNOSIS — G47419 Narcolepsy without cataplexy: Secondary | ICD-10-CM

## 2022-12-26 DIAGNOSIS — R270 Ataxia, unspecified: Secondary | ICD-10-CM

## 2022-12-26 DIAGNOSIS — R29898 Other symptoms and signs involving the musculoskeletal system: Secondary | ICD-10-CM | POA: Diagnosis not present

## 2022-12-26 DIAGNOSIS — G5732 Lesion of lateral popliteal nerve, left lower limb: Secondary | ICD-10-CM | POA: Diagnosis not present

## 2022-12-26 DIAGNOSIS — Z79899 Other long term (current) drug therapy: Secondary | ICD-10-CM

## 2022-12-26 DIAGNOSIS — G5602 Carpal tunnel syndrome, left upper limb: Secondary | ICD-10-CM

## 2022-12-26 DIAGNOSIS — R296 Repeated falls: Secondary | ICD-10-CM

## 2022-12-26 DIAGNOSIS — R292 Abnormal reflex: Secondary | ICD-10-CM

## 2022-12-26 DIAGNOSIS — R55 Syncope and collapse: Secondary | ICD-10-CM

## 2022-12-26 DIAGNOSIS — R202 Paresthesia of skin: Secondary | ICD-10-CM

## 2022-12-26 DIAGNOSIS — R5383 Other fatigue: Secondary | ICD-10-CM

## 2022-12-26 DIAGNOSIS — R251 Tremor, unspecified: Secondary | ICD-10-CM

## 2022-12-26 DIAGNOSIS — G379 Demyelinating disease of central nervous system, unspecified: Secondary | ICD-10-CM

## 2022-12-26 DIAGNOSIS — R29818 Other symptoms and signs involving the nervous system: Secondary | ICD-10-CM

## 2022-12-26 DIAGNOSIS — Z0289 Encounter for other administrative examinations: Secondary | ICD-10-CM

## 2022-12-26 DIAGNOSIS — M6281 Muscle weakness (generalized): Secondary | ICD-10-CM

## 2022-12-26 DIAGNOSIS — G8929 Other chronic pain: Secondary | ICD-10-CM

## 2022-12-26 DIAGNOSIS — R442 Other hallucinations: Secondary | ICD-10-CM

## 2022-12-26 NOTE — Procedures (Signed)
Full Name: [name] Gender: Female MRN #: [id number] Date of Birth: September 21, 1980    Visit Date: 12/26/2022 15:03 Age: 42 Years Examining Physician: Dr. Naomie Dean Referring Physician: Dr. Naomie Dean Height: 5 feet 7 inch  History: Patient here for left hand pain and transient episodes of left foot numbness and left foot dorsiflexion weakness.  Summary: NCS was performed on all 4 extremities: The right peroneal EDB motor conduction was within normal limits.  The left EDB peroneal motor conduction showed reduced amplitude (2.66mV, N>2) as compared to the right EDB peroneal conduction amplitude (6mv, N>2) which is a 63% reduction in amplitude of the left vs right EDB peroneal motor nerve. The left EDB peroneal motor nerve also showed a 4m/s drop across the fibular head (N<61m/s) and a conduction block of 2.71mv(N<1.1) across the fibular head. The left Tibialis anterior motor conduction showed reduced amplitude across the fibular head (fib head-tib ant,0.81mv, N>3) and decreased conduction velocity(21m/s, N>44).   The left median motor nerve conduction showed borderline distal onset latency(4.28ms, N<4.4 ms. The left  Median 2nd Digit orthodromic sensory conduction showed prolonged distal peak latency (4.4 ms, N<3.4) The left median/ulnar (palm) comparison nerve showed prolonged distal peak latency (Median Palm, 3.4 ms, N<2.2) and abnormal peak latency difference (Median Palm-Ulnar Palm, 1.5 ms, N<0.4) with a relative median delay.   All remaining nerves (as indicated in the following tables) were within normal limits.    EMG needle study was performed on the left upper and left lower extremities. The left Tibialis Anterior muscle and left Peroneus Longus muscle showed prolonged motor unit duration, polyphasic motor units and diminished motor unit recruitment.      Conclusion:  1. There is mild to moderate left Carpal Tunnel Syndrome.  2. 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     ------------------------------- Naomie Dean, M.D.  [facility] [address] [place], [place] [address] Tel: [phone] Fax: [phone]  Verbal informed consent was obtained from the patient, patient was informed of potential risk of procedure, including bruising, bleeding, hematoma formation, infection, muscle weakness, muscle pain, numbness, among others.        MNC    Nerve / Sites Muscle Latency Ref. Amplitude Ref. Rel Amp Segments Distance Velocity Ref. Area    ms ms mV mV %  cm m/s m/s mVms  L Median - APB     Wrist APB 4.4 ?4.4 6.9 ?4.0 100 Wrist - APB 7   24.4     Upper arm APB 9.1  8.2  118 Upper arm - Wrist 26 54 ?49 25.6  L Ulnar - ADM     Wrist ADM 2.4 ?3.3 7.3 ?6.0 100 Wrist - ADM 7   27.7     B.Elbow ADM 4.0  6.0  82.4 B.Elbow - Wrist 10.6 67 ?49 24.0     A.Elbow ADM 7.1  6.1  102 A.Elbow - B.Elbow 19 62 ?49 25.0  L Peroneal - EDB     Ankle EDB 5.9 ?6.5 2.2 ?2.0 100 Ankle - EDB 9   8.1     Fib head EDB 10.7  4.7  211 Fib head - Ankle 26 54 ?44 17.2     Pop fossa EDB 13.0  2.3  50.2 Pop fossa - Fib head 10 44 ?44 8.8         Pop fossa - Ankle  R Peroneal - EDB     Ankle EDB 5.0 ?6.5 6.0 ?2.0 100 Ankle - EDB 9   21.5     Fib head EDB 11.1  5.3  88.9 Fib head - Ankle 29 48 ?44 20.1     Pop fossa EDB 12.9  5.4  103 Pop fossa - Fib head 10 54 ?44 21.3         Pop fossa - Ankle      L Tibial - AH     Ankle AH 4.1 ?5.8 12.7 ?4.0 100 Ankle - AH 9   30.0     Pop fossa AH 11.9  10.3  81.6 Pop fossa - Ankle 36 47 ?41 28.0  L Peroneal - Tib Ant     Fib Head Tib Ant 2.1 ?4.7 0.3 ?3.0 100 Fib Head - Tib Ant 10   0.5     Pop fossa Tib Ant 5.2  0.7  280 Pop fossa - Fib Head 10 33 ?44 1.2                 SNC    Nerve / Sites Rec. Site Peak Lat Ref.  Amp Ref. Segments Distance Peak Diff Ref.    ms ms V V  cm ms ms  L Sural - Ankle (Calf)     Calf Ankle  3.8 ?4.4 13 ?6 Calf - Ankle 14    L Superficial peroneal - Ankle     Lat leg Ankle 4.1 ?4.4 13 ?6 Lat leg - Ankle 14    R Superficial peroneal - Ankle     Lat leg Ankle 3.9 ?4.4 16 ?6 Lat leg - Ankle 14    L Median, Ulnar - Transcarpal comparison     Median Palm Wrist 3.4 ?2.2 36 ?35 Median Palm - Wrist 8       Ulnar Palm Wrist 1.9 ?2.2 26 ?12 Ulnar Palm - Wrist 8          Median Palm - Ulnar Palm  1.5 ?0.4  R Median, Ulnar - Transcarpal comparison     Median Palm Wrist 2.0 ?2.2 42 ?35 Median Palm - Wrist 8       Ulnar Palm Wrist 1.9 ?2.2 38 ?12 Ulnar Palm - Wrist 8          Median Palm - Ulnar Palm  0.1 ?0.4  L Median - Orthodromic (Dig II, Mid palm)     Dig II Wrist 4.4 ?3.4 14 ?10 Dig II - Wrist 13    R Median - Orthodromic (Dig II, Mid palm)     Dig II Wrist 2.9 ?3.4 11 ?10 Dig II - Wrist 13    L Ulnar - Orthodromic, (Dig V, Mid palm)     Dig V Wrist 2.9 ?3.1 18 ?5 Dig V - Wrist 11    R Ulnar - Orthodromic, (Dig V, Mid palm)     Dig V Wrist 2.7 ?3.1 16 ?5 Dig V - Wrist 65                         F  Wave    Nerve F Lat Ref.   ms ms  L Tibial - AH 49.2 ?56.0  L Ulnar - ADM 24.9 ?32.0         EMG Summary Table    Spontaneous MUAP Recruitment  Muscle IA Fib PSW Fasc Other Amp Dur. Poly Pattern  L. Deltoid Normal None None None _______  Normal Increased Normal Normal  L. Triceps brachii Normal None None None _______ Normal Normal Normal Normal  L. Pronator teres Normal None None None _______ Normal Normal Normal Normal  L. Opponens pollicis Normal None None None _______ Normal Normal Normal Normal  L. First dorsal interosseous Normal None None None _______ Normal Normal Normal Normal  L. Vastus medialis Normal None None None _______ Normal Normal Normal Normal  L. Tibialis anterior Normal None None None _______ Normal Increased 2+ Reduced  L. Peroneus longus Normal None None None _______ Normal Increased 3+ Reduced  L. Gastrocnemius (Medial head) Normal None None None _______  Normal Normal Normal Normal  L. Extensor hallucis longus Normal None None None _______ Normal Normal Normal Normal  L. Abductor hallucis Normal None None None _______ Normal Normal Normal Normal  L. Biceps femoris (short head) Normal None None None _______ Normal Normal Normal Normal

## 2022-12-26 NOTE — Progress Notes (Signed)
History: Patient here for left hand pain and numbness and left foot falling asleep with temporary foot dorsiflexion weakness. We discussed findings of the emg/ncs which shoed:  Conclusion:  1. There is mild to moderate left Carpal Tunnel Syndrome.  2. There is peroneal neuropathy across the left fibular head with chronic neurogenic changes in distal peroneal muscles.   We discussed pathophysiology of left CTS, causes and treatments including conservative methods (Splints) or more invasive steroid injections or surgical options. Patient would like to try conservative measures. We discussed pathophysiology of peroneal neuropathy at the fibular head, causes, treatments. At this time we will order an xray to evaluate for  bone spurs, knee injury, fracture or other causes of nerve compression. We also discussed conservative measures such as not crossing legs. Chronic neurogenic changes were present in distal peroneal muscle but acute/ongoing denervation was not seen, so peroneal neuropathy may be remote. We will order a left knee xray and If no etology found will order MRI left knee. For both conditions we reviewed images online.  Orders Placed This Encounter  Procedures   DG Knee 3 Views Left   I spent over 20 minutes of face-to-face and non-face-to-face time with patient on the  1. Carpal tunnel syndrome of left wrist   2. Peroneal neuropathy at knee, left   3. transient Weakness of foot, left   4. transient Numbness of left foot    diagnosis.  This included previsit chart review, lab review, study review, order entry, electronic health record documentation, patient education on the different diagnostic and therapeutic options, counseling and coordination of care, risks and benefits of management, compliance, or risk factor reduction. This does not include time spent on emg/ncs.   Naomie Dean, MD

## 2022-12-26 NOTE — Progress Notes (Signed)
Full Name: [name] Gender: Female MRN #: [id number] Date of Birth: September 21, 1980    Visit Date: 12/26/2022 15:03 Age: 42 Years Examining Physician: Dr. Naomie Dean Referring Physician: Dr. Naomie Dean Height: 5 feet 7 inch  History: Patient here for left hand pain and transient episodes of left foot numbness and left foot dorsiflexion weakness.  Summary: NCS was performed on all 4 extremities: The right peroneal EDB motor conduction was within normal limits.  The left EDB peroneal motor conduction showed reduced amplitude (2.66mV, N>2) as compared to the right EDB peroneal conduction amplitude (6mv, N>2) which is a 63% reduction in amplitude of the left vs right EDB peroneal motor nerve. The left EDB peroneal motor nerve also showed a 4m/s drop across the fibular head (N<61m/s) and a conduction block of 2.71mv(N<1.1) across the fibular head. The left Tibialis anterior motor conduction showed reduced amplitude across the fibular head (fib head-tib ant,0.81mv, N>3) and decreased conduction velocity(21m/s, N>44).   The left median motor nerve conduction showed borderline distal onset latency(4.28ms, N<4.4 ms. The left  Median 2nd Digit orthodromic sensory conduction showed prolonged distal peak latency (4.4 ms, N<3.4) The left median/ulnar (palm) comparison nerve showed prolonged distal peak latency (Median Palm, 3.4 ms, N<2.2) and abnormal peak latency difference (Median Palm-Ulnar Palm, 1.5 ms, N<0.4) with a relative median delay.   All remaining nerves (as indicated in the following tables) were within normal limits.    EMG needle study was performed on the left upper and left lower extremities. The left Tibialis Anterior muscle and left Peroneus Longus muscle showed prolonged motor unit duration, polyphasic motor units and diminished motor unit recruitment.      Conclusion:  1. There is mild to moderate left Carpal Tunnel Syndrome.  2. 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     ------------------------------- Naomie Dean, M.D.  [facility] [address] [place], [place] [address] Tel: [phone] Fax: [phone]  Verbal informed consent was obtained from the patient, patient was informed of potential risk of procedure, including bruising, bleeding, hematoma formation, infection, muscle weakness, muscle pain, numbness, among others.        MNC    Nerve / Sites Muscle Latency Ref. Amplitude Ref. Rel Amp Segments Distance Velocity Ref. Area    ms ms mV mV %  cm m/s m/s mVms  L Median - APB     Wrist APB 4.4 ?4.4 6.9 ?4.0 100 Wrist - APB 7   24.4     Upper arm APB 9.1  8.2  118 Upper arm - Wrist 26 54 ?49 25.6  L Ulnar - ADM     Wrist ADM 2.4 ?3.3 7.3 ?6.0 100 Wrist - ADM 7   27.7     B.Elbow ADM 4.0  6.0  82.4 B.Elbow - Wrist 10.6 67 ?49 24.0     A.Elbow ADM 7.1  6.1  102 A.Elbow - B.Elbow 19 62 ?49 25.0  L Peroneal - EDB     Ankle EDB 5.9 ?6.5 2.2 ?2.0 100 Ankle - EDB 9   8.1     Fib head EDB 10.7  4.7  211 Fib head - Ankle 26 54 ?44 17.2     Pop fossa EDB 13.0  2.3  50.2 Pop fossa - Fib head 10 44 ?44 8.8         Pop fossa - Ankle  R Peroneal - EDB     Ankle EDB 5.0 ?6.5 6.0 ?2.0 100 Ankle - EDB 9   21.5     Fib head EDB 11.1  5.3  88.9 Fib head - Ankle 29 48 ?44 20.1     Pop fossa EDB 12.9  5.4  103 Pop fossa - Fib head 10 54 ?44 21.3         Pop fossa - Ankle      L Tibial - AH     Ankle AH 4.1 ?5.8 12.7 ?4.0 100 Ankle - AH 9   30.0     Pop fossa AH 11.9  10.3  81.6 Pop fossa - Ankle 36 47 ?41 28.0  L Peroneal - Tib Ant     Fib Head Tib Ant 2.1 ?4.7 0.3 ?3.0 100 Fib Head - Tib Ant 10   0.5     Pop fossa Tib Ant 5.2  0.7  280 Pop fossa - Fib Head 10 33 ?44 1.2                 SNC    Nerve / Sites Rec. Site Peak Lat Ref.  Amp Ref. Segments Distance Peak Diff Ref.    ms ms V V  cm ms ms  L Sural - Ankle (Calf)     Calf Ankle  3.8 ?4.4 13 ?6 Calf - Ankle 14    L Superficial peroneal - Ankle     Lat leg Ankle 4.1 ?4.4 13 ?6 Lat leg - Ankle 14    R Superficial peroneal - Ankle     Lat leg Ankle 3.9 ?4.4 16 ?6 Lat leg - Ankle 14    L Median, Ulnar - Transcarpal comparison     Median Palm Wrist 3.4 ?2.2 36 ?35 Median Palm - Wrist 8       Ulnar Palm Wrist 1.9 ?2.2 26 ?12 Ulnar Palm - Wrist 8          Median Palm - Ulnar Palm  1.5 ?0.4  R Median, Ulnar - Transcarpal comparison     Median Palm Wrist 2.0 ?2.2 42 ?35 Median Palm - Wrist 8       Ulnar Palm Wrist 1.9 ?2.2 38 ?12 Ulnar Palm - Wrist 8          Median Palm - Ulnar Palm  0.1 ?0.4  L Median - Orthodromic (Dig II, Mid palm)     Dig II Wrist 4.4 ?3.4 14 ?10 Dig II - Wrist 13    R Median - Orthodromic (Dig II, Mid palm)     Dig II Wrist 2.9 ?3.4 11 ?10 Dig II - Wrist 13    L Ulnar - Orthodromic, (Dig V, Mid palm)     Dig V Wrist 2.9 ?3.1 18 ?5 Dig V - Wrist 11    R Ulnar - Orthodromic, (Dig V, Mid palm)     Dig V Wrist 2.7 ?3.1 16 ?5 Dig V - Wrist 65                         F  Wave    Nerve F Lat Ref.   ms ms  L Tibial - AH 49.2 ?56.0  L Ulnar - ADM 24.9 ?32.0         EMG Summary Table    Spontaneous MUAP Recruitment  Muscle IA Fib PSW Fasc Other Amp Dur. Poly Pattern  L. Deltoid Normal None None None _______  Normal Increased Normal Normal  L. Triceps brachii Normal None None None _______ Normal Normal Normal Normal  L. Pronator teres Normal None None None _______ Normal Normal Normal Normal  L. Opponens pollicis Normal None None None _______ Normal Normal Normal Normal  L. First dorsal interosseous Normal None None None _______ Normal Normal Normal Normal  L. Vastus medialis Normal None None None _______ Normal Normal Normal Normal  L. Tibialis anterior Normal None None None _______ Normal Increased 2+ Reduced  L. Peroneus longus Normal None None None _______ Normal Increased 3+ Reduced  L. Gastrocnemius (Medial head) Normal None None None _______  Normal Normal Normal Normal  L. Extensor hallucis longus Normal None None None _______ Normal Normal Normal Normal  L. Abductor hallucis Normal None None None _______ Normal Normal Normal Normal  L. Biceps femoris (short head) Normal None None None _______ Normal Normal Normal Normal
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
## 2023-01-16 ENCOUNTER — Ambulatory Visit: Payer: 59 | Admitting: Infectious Disease

## 2023-01-24 ENCOUNTER — Other Ambulatory Visit: Payer: Self-pay

## 2023-01-24 ENCOUNTER — Encounter (INDEPENDENT_AMBULATORY_CARE_PROVIDER_SITE_OTHER): Payer: Self-pay

## 2023-01-24 DIAGNOSIS — Z006 Encounter for examination for normal comparison and control in clinical research program: Secondary | ICD-10-CM

## 2023-01-24 MED ORDER — STUDY - MK-8591A-052 - BICTEGRAVIR/EMTRICITABINE/TENOFOVIR ALAFENAMIDE (BIKTARVY) 50-200-25 MG OR PLACEBO TABLET (PI-VAN DAM): 1.0000 | ORAL_TABLET | Freq: Every day | ORAL | 0 refills | Status: DC

## 2023-01-24 MED ORDER — STUDY - MK-8591A-052 - MK-8591A 100/0.25 MG OR PLACEBO TABLET (PI-VAN DAM): 1.0000 | ORAL_TABLET | Freq: Every day | ORAL | 0 refills | Status: DC

## 2023-01-24 NOTE — Research (Signed)
Individual seen for research visit Week 48 for [id number], A Phase 3, Randomized, Active-Controlled, Double-Blind Clinical Study to Evaluate a Switch to Doravirine/Islatravir (DOR/ISL 100 mg/0.25 mg) Once-Daily in Participants With HIV-1 Who Are Virologically Suppressed on Bictegravir/Emtricitabine/Tenofovir Alafenamide (BIC/FTC/TAF).   Overall individual is doing well. All procedures carried out per protocol. Study medications dispensed. Plan to see participant again in 12 weeks.

## 2023-01-26 ENCOUNTER — Encounter: Payer: 59 | Admitting: Neurology

## 2023-01-26 ENCOUNTER — Telehealth: Payer: Self-pay | Admitting: Neurology

## 2023-01-26 NOTE — Telephone Encounter (Signed)
Eddie @ [employer] has called re: Order details: delivery scheduled for 02-01-23 between the hours of 8:00-5:00pm 200 units 1 vial, 84 day supply injecting 155 units, if there are questions on this please call [phone]

## 2023-02-21 ENCOUNTER — Ambulatory Visit: Payer: 59 | Admitting: Infectious Disease

## 2023-02-28 ENCOUNTER — Ambulatory Visit (INDEPENDENT_AMBULATORY_CARE_PROVIDER_SITE_OTHER): Payer: 59 | Admitting: Neurology

## 2023-02-28 DIAGNOSIS — G43709 Chronic migraine without aura, not intractable, without status migrainosus: Secondary | ICD-10-CM | POA: Diagnosis not present

## 2023-02-28 DIAGNOSIS — G43009 Migraine without aura, not intractable, without status migrainosus: Secondary | ICD-10-CM

## 2023-02-28 MED ORDER — SUMATRIPTAN SUCCINATE 6 MG/0.5ML ~~LOC~~ SOLN: 6.0000 mg | SUBCUTANEOUS | 11 refills | Status: AC | PRN

## 2023-02-28 MED ORDER — ONABOTULINUMTOXINA 200 UNITS IJ SOLR
155.0000 [IU] | Freq: Once | INTRAMUSCULAR | Status: AC
  Administered 2023-02-28: 155 [IU] via INTRAMUSCULAR

## 2023-02-28 NOTE — Progress Notes (Signed)
Botox 200 units x 1 vial  LOT #: [id number] EXP: 04/2025 NDC: 4782-9562-13  SP  Witnessed by Randa Evens.  Bacteriostatic 0.9% Sodium Chloride  LOT #: [id number] EXP: 08/29/2023 NDC#: 4696-2952-84

## 2023-02-28 NOTE — Progress Notes (Signed)
Consent Form Botulism Toxin Injection For Chronic Migraine    02/28/2023: Stable doing well. Will try sumatriptan injectable because she has 6 migraine days a month which is a fantastic improvement but still needs acute management as they can be bad and the medication she uses helps but doesn;t take it away completely.  Meds ordered this encounter  Medications   botulinum toxin Type A (BOTOX) injection 155 Units    Botox 200 units x 1 vial  LOT #: [id number] EXP: 04/2025 NDC: 6578-4696-29  SP   SUMAtriptan (IMITREX) 6 MG/0.5ML SOLN injection    Sig: Inject 0.5 mLs (6 mg total) into the skin every 2 (two) hours as needed for migraine or headache. Take one dose at headache onset, can take additional dose 2hrs later if needed. No more then 2 injections in 24hrs. Pleae five autoinjector if possible.    Dispense:  6 mL    Refill:  11    12/06/2022: still doing well, stable, >60% improved in freq and severity of all headaches and migraines 09/13/2022: stable 06/21/2022: stable still doing well 03/24/2022: stable 12/15/2021: >60% improvement in migraine freq and severity   Reviewed orally with patient, additionally signature is on file:  Botulism toxin has been approved by the Federal drug administration for treatment of chronic migraine. Botulism toxin does not cure chronic migraine and it may not be effective in some patients.  The administration of botulism toxin is accomplished by injecting a small amount of toxin into the muscles of the neck and head. Dosage must be titrated for each individual. Any benefits resulting from botulism toxin tend to wear off after 3 months with a repeat injection required if benefit is to be maintained. Injections are usually done every 3-4 months with maximum effect peak achieved by about 2 or 3 weeks. Botulism toxin is expensive and you should be sure of what costs you will incur resulting from the injection.  The side effects of botulism toxin use for chronic  migraine may include:   -Transient, and usually mild, facial weakness with facial injections  -Transient, and usually mild, head or neck weakness with head/neck injections  -Reduction or loss of forehead facial animation due to forehead muscle weakness  -Eyelid drooping  -Dry eye  -Pain at the site of injection or bruising at the site of injection  -Double vision  -Potential unknown long term risks  Contraindications: You should not have Botox if you are pregnant, nursing, allergic to albumin, have an infection, skin condition, or muscle weakness at the site of the injection, or have myasthenia gravis, Lambert-Eaton syndrome, or ALS.  It is also possible that as with any injection, there may be an allergic reaction or no effect from the medication. Reduced effectiveness after repeated injections is sometimes seen and rarely infection at the injection site may occur. All care will be taken to prevent these side effects. If therapy is given over a long time, atrophy and wasting in the muscle injected may occur. Occasionally the patient's become refractory to treatment because they develop antibodies to the toxin. In this event, therapy needs to be modified.  I have read the above information and consent to the administration of botulism toxin.    BOTOX PROCEDURE NOTE FOR MIGRAINE HEADACHE    Contraindications and precautions discussed with patient(above). Aseptic procedure was observed and patient tolerated procedure. Procedure performed by Dr. Artemio Aly  The condition has existed for more than 6 months, and pt does not have a diagnosis of ALS,  Myasthenia Gravis or Lambert-Eaton Syndrome.  Risks and benefits of injections discussed and pt agrees to proceed with the procedure.  Written consent obtained  These injections are medically necessary. Pt  receives good benefits from these injections. These injections do not cause sedations or hallucinations which the oral therapies may  cause.  Description of procedure:  The patient was placed in a sitting position. The standard protocol was used for Botox as follows, with 5 units of Botox injected at each site:   -Procerus muscle, midline injection  -Corrugator muscle, bilateral injection  -Frontalis muscle, bilateral injection, with 2 sites each side, medial injection was performed in the upper one third of the frontalis muscle, in the region vertical from the medial inferior edge of the superior orbital rim. The lateral injection was again in the upper one third of the forehead vertically above the lateral limbus of the cornea, 1.5 cm lateral to the medial injection site.  -Temporalis muscle injection, 4 sites, bilaterally. The first injection was 3 cm above the tragus of the ear, second injection site was 1.5 cm to 3 cm up from the first injection site in line with the tragus of the ear. The third injection site was 1.5-3 cm forward between the first 2 injection sites. The fourth injection site was 1.5 cm posterior to the second injection site.   -Occipitalis muscle injection, 3 sites, bilaterally. The first injection was done one half way between the occipital protuberance and the tip of the mastoid process behind the ear. The second injection site was done lateral and superior to the first, 1 fingerbreadth from the first injection. The third injection site was 1 fingerbreadth superiorly and medially from the first injection site.  -Cervical paraspinal muscle injection, 2 sites, bilateral knee first injection site was 1 cm from the midline of the cervical spine, 3 cm inferior to the lower border of the occipital protuberance. The second injection site was 1.5 cm superiorly and laterally to the first injection site.  -Trapezius muscle injection was performed at 3 sites, bilaterally. The first injection site was in the upper trapezius muscle halfway between the inflection point of the neck, and the acromion. The second  injection site was one half way between the acromion and the first injection site. The third injection was done between the first injection site and the inflection point of the neck.   Will return for repeat injection in 3 months.   200 units of Botox was used, 45 U Botox not injected was wasted. The patient tolerated the procedure well, there were no complications of the above procedure.

## 2023-03-21 ENCOUNTER — Telehealth: Payer: Self-pay | Admitting: Neurology

## 2023-03-21 NOTE — Telephone Encounter (Signed)
Received fax from [employer] to initiate new PA. I submitted via CMM and received approval. Pt does not currently have an appt scheduled because it looks like she owes [place] a balance.  Auth#: [id number] (03/21/23-06/21/23)

## 2023-04-04 ENCOUNTER — Encounter: Payer: Self-pay | Admitting: Infectious Disease

## 2023-04-04 DIAGNOSIS — Z7185 Encounter for immunization safety counseling: Secondary | ICD-10-CM

## 2023-04-04 HISTORY — DX: Encounter for immunization safety counseling: Z71.85
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
## 2023-04-05 ENCOUNTER — Ambulatory Visit (INDEPENDENT_AMBULATORY_CARE_PROVIDER_SITE_OTHER): Payer: 59 | Admitting: Infectious Disease

## 2023-04-05 ENCOUNTER — Other Ambulatory Visit: Payer: Self-pay

## 2023-04-05 ENCOUNTER — Encounter: Payer: Self-pay | Admitting: Infectious Disease

## 2023-04-05 VITALS — BP 164/97 | HR 93 | Temp 98.3°F | Wt 125.0 lb

## 2023-04-05 DIAGNOSIS — J392 Other diseases of pharynx: Secondary | ICD-10-CM

## 2023-04-05 DIAGNOSIS — Z7185 Encounter for immunization safety counseling: Secondary | ICD-10-CM | POA: Diagnosis not present

## 2023-04-05 DIAGNOSIS — R140 Abdominal distension (gaseous): Secondary | ICD-10-CM

## 2023-04-05 DIAGNOSIS — E782 Mixed hyperlipidemia: Secondary | ICD-10-CM | POA: Diagnosis not present

## 2023-04-05 DIAGNOSIS — B20 Human immunodeficiency virus [HIV] disease: Secondary | ICD-10-CM

## 2023-04-05 DIAGNOSIS — G40909 Epilepsy, unspecified, not intractable, without status epilepticus: Secondary | ICD-10-CM

## 2023-04-05 DIAGNOSIS — E785 Hyperlipidemia, unspecified: Secondary | ICD-10-CM

## 2023-04-05 MED ORDER — ATORVASTATIN CALCIUM 10 MG PO TABS: 10.0000 mg | ORAL_TABLET | Freq: Every day | ORAL | 11 refills | Status: AC

## 2023-04-06 ENCOUNTER — Ambulatory Visit: Payer: 59 | Admitting: Infectious Disease

## 2023-04-18 ENCOUNTER — Other Ambulatory Visit: Payer: Self-pay

## 2023-04-18 ENCOUNTER — Encounter (INDEPENDENT_AMBULATORY_CARE_PROVIDER_SITE_OTHER): Payer: Self-pay

## 2023-04-18 DIAGNOSIS — Z006 Encounter for examination for normal comparison and control in clinical research program: Secondary | ICD-10-CM

## 2023-04-18 MED ORDER — STUDY - MK-8591A-052 - BICTEGRAVIR/EMTRICITABINE/TENOFOVIR ALAFENAMIDE (BIKTARVY) 50-200-25 MG OR PLACEBO TABLET (PI-VAN DAM): 1.0000 | ORAL_TABLET | Freq: Every day | ORAL | 0 refills | Status: DC

## 2023-04-18 MED ORDER — STUDY - MK-8591A-052 - MK-8591A 100/0.25 MG OR PLACEBO TABLET (PI-VAN DAM): 1.0000 | ORAL_TABLET | Freq: Every day | ORAL | 0 refills | Status: DC

## 2023-04-18 NOTE — Research (Signed)
Individual seen for research visit Week 60 for [id number], A Phase 3, Randomized, Active-Controlled, Double-Blind Clinical Study to Evaluate a Switch to Doravirine/Islatravir (DOR/ISL 100 mg/0.25 mg) Once-Daily in Participants With HIV-1 Who Are Virologically Suppressed on Bictegravir/Emtricitabine/Tenofovir Alafenamide (BIC/FTC/TAF).     Inform Consent for Amendment 02 explained/reviewed, risk, benefits, responsibilities and other options were reviewed. Questions answered, comprehension of the study was assessed and adequate time to consider options was provided. Individual verbalized understanding and signed the informed consent witnessed by study coordinator. Signed consent was obtained prior to any procedures being done.    Overall individual is doing well. All procedures carried out per protocol. Study medications dispensed. Plan to see participant again in 12 weeks.

## 2023-07-11 ENCOUNTER — Other Ambulatory Visit: Payer: Self-pay

## 2023-07-11 DIAGNOSIS — Z006 Encounter for examination for normal comparison and control in clinical research program: Secondary | ICD-10-CM

## 2023-07-11 MED ORDER — STUDY - MK-8591A-052 - BICTEGRAVIR/EMTRICITABINE/TENOFOVIR ALAFENAMIDE (BIKTARVY) 50-200-25 MG OR PLACEBO TABLET (PI-VAN DAM)
1.0000 | ORAL_TABLET | Freq: Every day | ORAL | 0 refills | Status: DC
Start: 2023-07-11 — End: 2023-10-03

## 2023-07-11 MED ORDER — STUDY - MK-8591A-052 - MK-8591A 100/0.25 MG OR PLACEBO TABLET (PI-VAN DAM): 1.0000 | ORAL_TABLET | Freq: Every day | ORAL | 0 refills | Status: DC

## 2023-07-11 NOTE — Research (Signed)
Individual seen for research visit Week 72 for [id number], A Phase 3, Randomized, Active-Controlled, Double-Blind Clinical Study to Evaluate a Switch to Doravirine/Islatravir (DOR/ISL 100 mg/0.25 mg) Once-Daily in Participants With HIV-1 Who Are Virologically Suppressed on Bictegravir/Emtricitabine/Tenofovir Alafenamide (BIC/FTC/TAF).   Overall individual is doing well. All procedures carried out per protocol. Study medications dispensed. Plan to see participant again in 12 weeks.

## 2023-08-03 ENCOUNTER — Other Ambulatory Visit: Payer: Self-pay

## 2023-08-03 ENCOUNTER — Encounter

## 2023-08-03 DIAGNOSIS — Z006 Encounter for examination for normal comparison and control in clinical research program: Secondary | ICD-10-CM

## 2023-08-07 NOTE — Research (Signed)
 Individual seen for unscheduled research visit for [id number], A Phase 3, Randomized, Active-Controlled, Double-Blind Clinical Study to Evaluate a Switch to Doravirine/Islatravir (DOR/ISL 100 mg/0.25 mg) Once-Daily in Participants With HIV-1 Who Are Virologically Suppressed on Bictegravir/Emtricitabine/Tenofovir Alafenamide (BIC/FTC/TAF).   Ppt. Came in to repeat lab for study.

## 2023-10-03 ENCOUNTER — Other Ambulatory Visit: Payer: Self-pay

## 2023-10-03 ENCOUNTER — Encounter: Admitting: *Deleted

## 2023-10-03 ENCOUNTER — Ambulatory Visit: Payer: 59 | Admitting: Infectious Disease

## 2023-10-03 DIAGNOSIS — Z006 Encounter for examination for normal comparison and control in clinical research program: Secondary | ICD-10-CM

## 2023-10-03 MED ORDER — STUDY - MK-8591A-052 - MK-8591A 100/0.25 MG OR PLACEBO TABLET (PI-VAN DAM): 1.0000 | ORAL_TABLET | Freq: Every day | ORAL | 0 refills | Status: DC

## 2023-10-03 MED ORDER — STUDY - MK-8591A-052 - BICTEGRAVIR/EMTRICITABINE/TENOFOVIR ALAFENAMIDE (BIKTARVY) 50-200-25 MG OR PLACEBO TABLET (PI-VAN DAM): 1.0000 | ORAL_TABLET | Freq: Every day | ORAL | 0 refills | Status: DC
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
## 2023-10-11 NOTE — Progress Notes (Signed)
 The 10-year ASCVD risk score (Arnett DK, et al., 2019) is: 3.5%   Values used to calculate the score:     Age: 43 years     Sex: Female     Is Non-Hispanic African American: No     Diabetic: No     Tobacco smoker: Yes     Systolic Blood Pressure: 158 mmHg     Is BP treated: No     HDL Cholesterol: 53 mg/dL     Total Cholesterol: 180 mg/dL  Arlon Bergamo, BSN, RN

## 2023-12-25 ENCOUNTER — Encounter

## 2023-12-25 ENCOUNTER — Other Ambulatory Visit: Payer: Self-pay

## 2023-12-25 DIAGNOSIS — Z006 Encounter for examination for normal comparison and control in clinical research program: Secondary | ICD-10-CM

## 2023-12-25 MED ORDER — STUDY - MK-8591A-052 - BICTEGRAVIR/EMTRICITABINE/TENOFOVIR ALAFENAMIDE (BIKTARVY) 50-200-25 MG OR PLACEBO TABLET (PI-VAN DAM): 1.0000 | ORAL_TABLET | Freq: Every day | ORAL | 0 refills | Status: DC

## 2023-12-25 MED ORDER — STUDY - MK-8591A-052 - MK-8591A 100/0.25 MG OR PLACEBO TABLET (PI-VAN DAM): 1.0000 | ORAL_TABLET | Freq: Every day | ORAL | 0 refills | Status: DC

## 2023-12-25 NOTE — Research (Signed)
 Individual seen for research visit Week 96 for [id number], A Phase 3, Randomized, Active-Controlled, Double-Blind Clinical Study to Evaluate a Switch to Doravirine/Islatravir (DOR/ISL 100 mg/0.25 mg) Once-Daily in Participants With HIV-1 Who Are Virologically Suppressed on Bictegravir/Emtricitabine /Tenofovir  Alafenamide (BIC/FTC/TAF).   Overall individual is doing well. All procedures carried out per protocol. Study medications dispensed. Discussed elevated BP at visit, asymptomatic, ppt reports she is seeing her pcp in about 2 weeks and will discuss this with her also. Plan to see participant again in October 2025.

## 2024-01-05 ENCOUNTER — Encounter (HOSPITAL_COMMUNITY): Payer: Self-pay

## 2024-01-05 ENCOUNTER — Emergency Department (HOSPITAL_COMMUNITY)

## 2024-01-05 ENCOUNTER — Emergency Department (HOSPITAL_COMMUNITY)
Admission: EM | Admit: 2024-01-05 | Discharge: 2024-01-05 | Disposition: A | Discharge status: Home / Self Care | Attending: Emergency Medicine | Admitting: Emergency Medicine

## 2024-01-05 ENCOUNTER — Other Ambulatory Visit: Payer: Self-pay

## 2024-01-05 DIAGNOSIS — S62324A Displaced fracture of shaft of fourth metacarpal bone, right hand, initial encounter for closed fracture: Secondary | ICD-10-CM | POA: Diagnosis not present

## 2024-01-05 DIAGNOSIS — M79641 Pain in right hand: Secondary | ICD-10-CM | POA: Diagnosis not present

## 2024-01-05 DIAGNOSIS — S6991XA Unspecified injury of right wrist, hand and finger(s), initial encounter: Secondary | ICD-10-CM | POA: Diagnosis present

## 2024-01-05 DIAGNOSIS — W01198A Fall on same level from slipping, tripping and stumbling with subsequent striking against other object, initial encounter: Secondary | ICD-10-CM | POA: Diagnosis not present

## 2024-01-05 MED ORDER — OXYCODONE HCL 5 MG PO TABS: 5.0000 mg | ORAL_TABLET | Freq: Four times a day (QID) | ORAL | 0 refills | Status: AC | PRN

## 2024-01-05 MED ORDER — OXYCODONE-ACETAMINOPHEN 5-325 MG PO TABS
1.0000 | ORAL_TABLET | Freq: Once | ORAL | Status: AC
  Administered 2024-01-05: 1 via ORAL
  Filled 2024-01-05: qty 1

## 2024-01-05 NOTE — ED Provider Notes (Signed)
 [place] EMERGENCY DEPARTMENT AT [facility] Provider Note   CSN: [id number] Arrival date & time: 01/05/24  9249     Patient presents with: Hand Pain   [name] is a 43 y.o. female.   Patient presents to the emergency department for evaluation of hand pain after a fall this morning.  Patient states that she was trying to take out garbage and tripped.  She struck her hand on the table and then on the ground.  She complains of deformity and pain in her right hand.  Pain is worse with movement.  No treatments prior to arrival.  She denies head or neck injury.  No bleeding.       Prior to Admission medications   Medication Sig Start Date End Date Taking? Authorizing Provider  albuterol  (PROVENTIL  HFA;VENTOLIN  HFA) 108 (90 Base) MCG/ACT inhaler Inhale 2 puffs into the lungs every 6 (six) hours as needed for wheezing.    [provider]  amphetamine-dextroamphetamine (ADDERALL) 30 MG tablet Take 30 mg by mouth 2 (two) times daily. 02/25/21   [provider]  atorvastatin  (LIPITOR) 10 MG tablet Take 1 tablet (10 mg total) by mouth daily. Patient not taking: Reported on 12/25/2023 04/05/23   Fleeta Rothman, Jomarie SAILOR, MD  botulinum toxin Type A  (BOTOX ) 200 units injection PROVIDER TO INJECT 155 UNITS INTO THE  MUSCLES OF HEAD AND NECK EVERY 12 WEEKS. DISCARD REMAINDER. 11/07/22   Ines Onetha NOVAK, MD  clonazePAM  (KLONOPIN ) 1 MG tablet Take 1 mg by mouth 2 (two) times daily. 10/29/19   [provider]  diclofenac  Sodium (VOLTAREN ) 1 % GEL Apply 4 g topically 4 (four) times daily. 03/17/22   Ines Onetha NOVAK, MD  eletriptan  (RELPAX ) 40 MG tablet Take 1 tablet (40 mg total) by mouth as needed for migraine or headache. May repeat in 2 hours if headache persists or recurs. 10/28/21   Ines Onetha NOVAK, MD  Fremanezumab -vfrm (AJOVY ) 225 MG/1.5ML SOAJ INJECT 225MG  (1 INJECTOR) INTO THE SKIN EVERY 30 DAYS 10/11/22   Ines Onetha NOVAK, MD  gabapentin  (NEURONTIN ) 300 MG  capsule TAKE 3 CAPSULES (900 MG TOTAL) BY MOUTH 3 (THREE) TIMES DAILY AS NEEDED. 08/24/22   Ines Onetha NOVAK, MD  lamoTRIgine  (LAMICTAL ) 200 MG tablet Take 1.5 tablets (300 mg total) by mouth 2 (two) times daily. 03/21/22   Ines Onetha NOVAK, MD  ondansetron  (ZOFRAN -ODT) 4 MG disintegrating tablet DISSOLVE 1 TABLET ON TONGUE EVERY 8 HOURS AS NEEDED FOR NAUSEA 10/05/21   Ines Onetha NOVAK, MD  Study [phone] - bictegravir/emtrictabine/tenofovir  alafenamide (BIKTARVY ) 50-200-25 mg or placebo tablet ([place]) Take 1 tablet by mouth daily. 12/25/23   Orlando Burnard SAUNDERS, PA-C  Study - [id number] - MK-8591A 100/0.25 mg or placebo tablet ([place]) Take 1 tablet by mouth daily. 12/25/23   Orlando Burnard SAUNDERS, PA-C  SUMAtriptan  (IMITREX ) 6 MG/0.5ML SOLN injection Inject 0.5 mLs (6 mg total) into the skin every 2 (two) hours as needed for migraine or headache. Take one dose at headache onset, can take additional dose 2hrs later if needed. No more then 2 injections in 24hrs. Pleae five autoinjector if possible. 02/28/23   Ines Onetha NOVAK, MD  SYMBICORT 160-4.5 MCG/ACT inhaler Inhale 2 puffs into the lungs daily.    [provider]  UBRELVY  100 MG TABS TAKE 100 MG BY MOUTH EVERY 2 (TWO) HOURS AS NEEDED. MAXIMUM 200MG  A DAY. 10/28/21   Ines Onetha NOVAK, MD    Allergies: Naloxone , Galcanezumab-gnlm, and Glycopyrrolate   Review of Systems  Updated Vital Signs LMP 01/08/2016   Physical Exam Vitals and nursing note reviewed.  Constitutional:      Appearance: She is well-developed.  HENT:     Head: Normocephalic and atraumatic.  Eyes:     Pupils: Pupils are equal, round, and reactive to light.  Cardiovascular:     Pulses: Normal pulses. No decreased pulses.  Musculoskeletal:        General: Tenderness present.     Right elbow: Normal range of motion. No tenderness.     Right wrist: No tenderness or bony tenderness. Normal range of motion.     Right hand: Deformity present. No bony  tenderness. Decreased range of motion.     Cervical back: Normal range of motion and neck supple.     Comments: Tenderness over the 4th right metacarpal with associated mild swelling.  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  Skin:    General: Skin is warm and dry.  Neurological:     Mental Status: She is alert.     Sensory: No sensory deficit.     Comments: Motor, sensation, and vascular distal to the injury is fully intact.   Psychiatric:        Mood and Affect: Mood normal.     (all labs ordered are listed, but only abnormal results are displayed) Labs Reviewed - No data to display  EKG: None  Radiology: DG Hand Complete Right Result Date: 01/05/2024 CLINICAL DATA:  Fall with right hand pain. EXAM: RIGHT HAND - COMPLETE 3+ VIEW COMPARISON:  10/26/2017 FINDINGS: There is a displaced oblique fracture of the mid to distal diaphysis of the fourth metacarpal. Remaining bones, joint spaces and soft tissues are normal. IMPRESSION: Displaced oblique fracture of the mid to distal diaphysis of the fourth metacarpal. Electronically Signed   By: Toribio Agreste M.D.   On: 01/05/2024 08:43     Procedures   Medications Ordered in the ED  oxyCODONE -acetaminophen  (PERCOCET/ROXICET) 5-325 MG per tablet 1 tablet (1 tablet Oral Given 01/05/24 0820)   ED Course  Patient seen and examined. History obtained directly from patient.   Labs/EKG: None ordered  Imaging: Ordered x-ray of the right hand.  Medications/Fluids: Ordered: P.o. Percocet  Most recent vital signs reviewed and are as follows: BP (!) 142/97 (BP Location: Left Arm)   Pulse 76   Temp 97.9 F (36.6 C) (Oral)   Resp 18   Ht 5' 6 (1.676 m)   LMP 01/08/2016   SpO2 100%   BMI 17.26 kg/m   Initial impression: Hand injury  9:15 AM Reassessment performed. Patient appears stable.   Discussed case with Dr. Charlyn, reviewed xray.   Imaging personally visualized and interpreted including: X-ray, agree  displaced 4th metacarpal fx  Reviewed pertinent lab work and imaging with patient at bedside. Questions answered.   Most current vital signs reviewed and are as follows: BP (!) 142/97 (BP Location: Left Arm)   Pulse 76   Temp 97.9 F (36.6 C) (Oral)   Resp 18   Ht 5' 6 (1.676 m)   LMP 01/08/2016   SpO2 100%   BMI 17.26 kg/m   Plan: Ulnar gutter splint, ortho f/u, pain med for home. Discussed importance of outpatient follow-up 2/2 malrotation of the finger.   9:51 AM Reassessment performed. Patient appears stable.  Splint has been placed.  Distal CMS intact.  Imaging personally visualized and interpreted including: X-ray of the hand, displaced oblique fracture of the  mid to distal diaphysis noted.  Reviewed pertinent lab work and imaging with patient at bedside. Questions answered.   Most current vital signs reviewed and are as follows: BP (!) 154/100   Pulse 70   Temp 97.9 F (36.6 C) (Oral)   Resp 17   Ht 5' 6 (1.676 m)   LMP 01/08/2016   SpO2 100%   BMI 17.26 kg/m   Plan: Discharge to home.   Prescriptions written for: Oxycodone  # 10 tablets  Other home care instructions discussed: RICE protocol  ED return instructions discussed: New or worsening sx  Follow-up instructions discussed: Patient encouraged to follow-up with ortho in 5 days.                                   Medical Decision Making Amount and/or Complexity of Data Reviewed Radiology: ordered.  Risk Prescription drug management.   Patient with mechanical fall, fourth metacarpal fracture with malrotation.  Patient placed in splint, given orthopedic hand follow-up, discussed need to follow-up with Ortho for definitive fracture management to prevent long-term disability.  She verbalizes understanding agrees with plan.  Upper extremity is neurovascularly intact.     Final diagnoses:  Closed displaced fracture of shaft of fourth metacarpal bone of right hand, initial encounter    ED Discharge  Orders          Ordered    oxyCODONE  (OXY IR/ROXICODONE ) 5 MG immediate release tablet  Every 6 hours PRN        01/05/24 0952               Desiderio Chew, PA-C 01/05/24 9045    Charlyn Sora, MD 01/06/24 1950

## 2024-01-05 NOTE — ED Notes (Signed)
 Ortho applied splint to pts R Hand

## 2024-01-05 NOTE — ED Triage Notes (Signed)
 Pt states she tripped and caught her right arm/hand on a table. No lacerations

## 2024-01-05 NOTE — Discharge Instructions (Signed)
 Please read and follow all provided instructions.  Your diagnoses today include:  1. Closed displaced fracture of shaft of fourth metacarpal bone of right hand, initial encounter     Tests performed today include: An x-ray of the affected area - shows xray of hand Vital signs. See below for your results today.   Medications prescribed:  Oxycodone  - narcotic pain medication  DO NOT drive or perform any activities that require you to be awake and alert because this medicine can make you drowsy.   Naproxen  - anti-inflammatory pain medication Do not exceed 500mg  naproxen  every 12 hours, take with food  You have been prescribed an anti-inflammatory medication or NSAID. Take with food. Take smallest effective dose for the shortest duration needed for your pain. Stop taking if you experience stomach pain or vomiting.   Take any prescribed medications only as directed.  Home care instructions:  Follow any educational materials contained in this packet Follow R.I.C.E. Protocol: R - rest your injury  I  - use ice on injury without applying directly to skin C - compress injury with bandage or splint E - elevate the injury as much as possible  Follow-up instructions: Please follow-up with the provided orthopedic physician in the next 5 days.  This is very important because you have abnormal angulation of your finger.  If it heals in place it could cause long-term difficulty with use of your hand.  Return instructions:  Please return if your fingers are numb or tingling, appear gray or blue, or you have severe pain (also elevate the arm and loosen splint or wrap if you were given one) Please return to the Emergency Department if you experience worsening symptoms.  Please return if you have any other emergent concerns.  Additional Information:  Your vital signs today were: BP (!) 154/100   Pulse 70   Temp 97.9 F (36.6 C) (Oral)   Resp 17   Ht 5' 6 (1.676 m)   LMP 01/08/2016   SpO2  100%   BMI 17.26 kg/m  If your blood pressure (BP) was elevated above 135/85 this visit, please have this repeated by your doctor within one month. --------------

## 2024-01-05 NOTE — Progress Notes (Signed)
 Orthopedic Tech Progress Note Patient Details:  [name] 03-28-1981 [id number]  Ortho Devices Type of Ortho Device: Ulna gutter splint Ortho Device/Splint Location: Right hand Ortho Device/Splint Interventions: Application   Post Interventions Patient Tolerated: Well  Massie BRAVO [name] 01/05/2024, 9:46 AM

## 2024-03-12 NOTE — Unmapped External Note (Signed)
  [facility]  - OCCUPATIONAL THERAPY [place] [phone] [place] [address] [phone] Fax: [phone]   PHYSICIAN SIGNATURE REQUIRED  Thank you for your referral. Please review the following Plan of Care and sign electronically, or fax signed paper copy to: [phone].   Please call Dept: [phone] with any questions or concerns.  Case Windle Hasten, OTR/L  Occupational Therapy Plan of Care  Initial Evaluation Diagnosis:  1. Closed displaced fracture of shaft of fourth metacarpal bone of right hand, sequela      2. Right hand pain      3. Closed displaced fracture of base of fourth metacarpal bone of right hand with delayed healing      4. Vitamin D  deficiency           Impairment List: Activity tolerance, Edema, Fine motor control, Functional limitations, Proprioception/kinesthetic deficits, Upper extremity range of motion, Upper extremity strength, Pain limiting function/lifestyle, Orthotics needs  Plan and Recommendations: Treatment Interventions: Cognitive retraining (97129/97130); Manual therapy (97140); Orthotic fitting/training (02239); Selfcare/ADL Training (02464); Therapeutic activity (97530); Therapeutic exercise (97110); Self-care/home management training (02464); Iontophoresis - 4 mg/ml Dexamethasone  (02966); Fluidotherapy [phone]); Ultrasound (02964); Electrical Stimulation [phone]); Paraffin (97018); Whirlpool [phone]) OT Frequency: 2x/week OT Duration: 90 days Treatment Plan Details: 2x/week x10 visits   Goals:  Goals Addressed             This Visit's Progress   . OT Goal       STG - 2 visits Pt will be compliant with precautions to maximize likelihood of healing Pt will be independent in HEP to achieve the best possible outcome LTG - 10 visits Pt iwll improve R wrist ROM from 55/70 flex/ext to 65/80 to increase ease of opening doors Pt will demonstrate thumb opposition to base of SF for increased ease  of turning a key Therapist to assess QuickDASH and update goals Therapist to assess grip/pinch if cleared by MD and update goals Pt will increase digital TAM to 200+ in each digit IF:160; LF: 155: RF: 110; SF: 85         Certification: This is to certify that the above named patient, who is under my care, requires skilled Therapy services as described in the above treatment plan. I further certify that the services outlined in this plan are skilled and medically necessary. I have reviewed this plan for rehabilitation services, and I recommend that these services continue to meet the above stated goals and plan.

## 2024-03-12 NOTE — Progress Notes (Signed)
 Occupational Therapy Evaluation - Hand   Payor: MEDICARE / Plan: MEDICARE A&B / Product Type: Medicare /        Demographics:  Age: 43 y.o.  Gender: female  Referring Diagnosis: S62.324S - Closed displaced fracture of shaft of fourth metacarpal bone of right hand, sequela M79.641 - Right hand pain S62.314G - Closed displaced fracture of base of fourth metacarpal bone of right hand with delayed healing E55.9 - Vitamin D  deficiency  Referring Clinician:  Anitra Lynwood Ade, *  Hand Dominance:  Right  Date of Onset:  DoI: 01/03/2024; DoS: 01/09/2024 History of Present Illness:  Pt slipped and fell while taking out the trash, landed with all her weight on her hand, attempted fixation on 01/09/2024. Per recent MD visit: status post ORIF right fourth metacarpal fracture with failure of fixation, elected to proceed with nonoperative management, DOS: 01/09/2024. fourth metacarpal fracture with retained hardware minimal callus. Pt with moderate/severe pain with motion, reports she feels like she can feel the screws in her hand while attempting movement.    Significant Past Medical History:  Medical History[1]  Concurrent Services:  None Therapy within Past Year:  no  Medications:  Current Medications[2] Allergies:   Allergies as of 03/12/2024 - Reviewed 02/28/2024  Allergen Reaction Noted  . Glycopyrrolate  Other (See Comments) 03/06/2015  . Naloxone  Hives and Rash 12/04/2019    Rehabilitation Precautions/Restrictions:   Precautions/Restrictions Precautions: Epilepsy, R RCR and R Frozen Shoulder. History of R UE Sx Restrictions: Minimal callus formation currently       SUBJECTIVE See above  Fall Risk Screen:  Subjective Fall History Fall in the last year?: No  Current Functional Limitations:  Patient Stated Goals:  Get as much motion and strength back in her hand as possible Pain:  Pain Assessment Pain Assessment: 0-10 Pain Score  : 4 (Pain in the dorsum of the hand over  MPJ and in PIPJ) Home Environment:  Lives with boyfriend, children are grown.  Equipment Owned:  Adult nurse Exercise History: Did not inquire Work Status:         OBJECTIVE  General Observation:  Pt arrives to OT Tx session with no signs of distress. Fusiform swelling in R PIPJ. Edema observable in RF/SF.    Skin Condition:  Intact  Sensation:   Numbness in ulnar aspect of IF   Range of Motion:   UE AROM  Wrist                 L                 R  Flexion 70 55  Extension 85 70  Radial Deviation 35 20  Ulnar Deviation 35 20    Thumb                R  MP ext/flex 70  IP ext/flex 45  Palmar ADB 45  Radial ABD 45  Kapandji To LF tip   R Hand   Pt reports limited motion in R pinky from prior injuyr                IF                LF                 RF              SF  MPJ 75 65 30 30  PIPJ 85 90 65 -20/75  DIPJ 0 0 15  0  TAM 160 155 110 85   Strength:   Contraindicated  Palpation: Painful over dorsal 4th MPJ  Outcomes:      To assess next visit  Interventions:   OT Evaluation (30 minutes)  Therapeutic Exercise (15 minutes)  Pt performs the following exercises with direct therapist supervision, providing verbal and tactile corrections as needed, as part of HEP. Pt provided with written handout to take home, including video demonstrations, to promote adherence to HEP. DIPJ blocking PIPJ blocking MPJ flexion Hook fist to composite fist over built up handle Educated in coban wrap over RF for edema management and retrograde massage. Recommended use of heat to improve tissue extensibility and assist with pain management.   Education:    Yes, provided as follows:   Barriers to Learning:  No Barriers   Learning Needs:  Clinic/Hospital, Illness/disease, Treatment plan, Pain Management, Safety, Precautions, When/how to obtain future treatment, and Attendance Policy   Education Provided:  Per learning needs listed above   Audience / Response:   Patient  Applied knowledge, Verbalized understanding, and Demonstrated skill   Mode:  Explanation, Demonstration, and Printed material provided   Interpreter Utilized: N/A    ASSESSMENT Pt is a 43 y/o female who presents today status post ORIF right fourth metacarpal fracture with failure of fixation and election to proceed with nonoperative management, DOS: 01-09-2024 . Pt is limited by pain, edema, muscle weakness, and joint stiffness, which impairs daily participation in I/BADL activities.   Initiated digital AROM exercises to improve ROM and promote fx healing. Recommended retrograde massage and compression wrap for edema management. Plan to progress A/PROM exercise as tolerated to maximize digital ROM and function and trial thermal modalities for pain management.  Pt will benefit from skilled OT to address aforementioned deficits and improve functional performance of B/IADLs.    Therapy Diagnosis:    ICD-10-CM   1. Closed displaced fracture of shaft of fourth metacarpal bone of right hand, sequela  S62.324S   2. Right hand pain  M79.641   3. Closed displaced fracture of base of fourth metacarpal bone of right hand with delayed healing  S62.314G   4. Vitamin D  deficiency  E55.9    Problem List: Assessment Impairment List: Activity tolerance; Edema; Fine motor control; Functional limitations; Proprioception/kinesthetic deficits; Upper extremity range of motion; Upper extremity strength; Pain limiting function/lifestyle; Orthotics needs    Other Rehabilitation Considerations:  See Rehab Precautions  Response to Evaluation:  The session was tolerated well, as evidenced by Pt agreement with pOC  Rehabilitation Potential:    Motivation/Commitment to Therapy:  Good  Rehabilitation Potential:  Fair  Support Structure:  Good  Family member willing to assist  Goals:   Goals Addressed             This Visit's Progress   . OT Goal       STG - 2 visits Pt will be compliant with  precautions to maximize likelihood of healing Pt will be independent in HEP to achieve the best possible outcome LTG - 10 visits Pt iwll improve R wrist ROM from 55/70 flex/ext to 65/80 to increase ease of opening doors Pt will demonstrate thumb opposition to base of SF for increased ease of turning a key Therapist to assess QuickDASH and update goals Therapist to assess grip/pinch if cleared by MD and update goals Pt will increase digital TAM to 200+ in each digit IF:160; LF: 155: RF: 110; SF: 85  PLAN Treatment Frequency and Duration:  OT Frequency: 2x/week OT Duration: 90 days Treatment Plan Details: 2x/week x10 visits  Recommended OT Procedures: Cognitive retraining (97129/97130); Manual therapy (97140); Orthotic fitting/training (02239); Selfcare/ADL Training (02464); Therapeutic activity (97530); Therapeutic exercise (97110); Self-care/home management training (02464); Iontophoresis - 4 mg/ml Dexamethasone  (02966); Fluidotherapy [phone]); Ultrasound (02964); Electrical Stimulation [phone]); Paraffin (97018); Whirlpool [phone])   Necessity: Required to return to Premorbid environment (or reside in new living environment)?  no Required to reduce ADL or IADL assistance to Premorbid level?  yes  Recommended Consults:  None currently  Development of Plan of Care:  Patient participated in plan of care development.  Total Treatment Time (Time & Untimed): Total Treatment Minutes: 45 Total Time in Timed Codes: Timed Code Minutes: 15 Eval/Re-Eval OT Eval Low Complexity 30 minutes: 30   Treatment/Procedure $Therapeutic Exercises minutes: 15         OT Eval Complexity: Occupational Profile/Medical and Therapy History: Brief history including review of medical and/or therapy records relating to the presenting problem Therapy Evaluation Assessment: 1-3 performance deficits relating to physical, cognitive, or psychosocial limitations/restrictions Clinical Decision Making: Low  complexity, limited amount of treatment options, no assessment modification, no comorbidities OT Eval Complexity Score: Low Complexity     The patient has been instructed to contact our clinic if any questions or problems should arise.         [1] Past Medical History: Diagnosis Date  . Migraines   . Seizures    (CMD)    last 05-2023  [2]  Current Outpatient Medications:  .  albuterol  HFA (PROVENTIL  HFA;VENTOLIN  HFA;PROAIR  HFA) 90 mcg/actuation inhaler, Inhale 2 puffs every 4 (four) hours as needed for wheezing or shortness of breath., Disp: , Rfl:  .  baclofen  (LIORESAL ) 10 mg tablet, Take 10 mg by mouth 3 (three) times a day as needed., Disp: , Rfl: 11 .  buprenorphine  (SUBUTEX ) 8 mg subl tablet, PLACE 1 TABLET UNDER THE TONGUE 3 TIMES A DAY AS INSTUCTED (Patient not taking: Reported on 01/08/2024), Disp: , Rfl: 0 .  ciclopirox (LOPROX) 0.77 % cream, Apply  topically 2 (two) times a day for 14 days., Disp: 30 g, Rfl: 1 .  clonazePAM  (KlonoPIN ) 1 mg tablet, Take 2.5 mg by mouth as needed., Disp: , Rfl:  .  diclofenac  sodium (VOLTAREN ) 1 % gel, Apply 1 Application topically as needed (pain). (Patient not taking: Reported on 01/08/2024), Disp: , Rfl:  .  dicyclomine (BENTYL) 10 mg capsule, TAKE 1 CAPSULE BY MOUTH 4 TIMES A DAY AS NEEDED (Patient not taking: Reported on 01/08/2024), Disp: 120 capsule, Rfl: 3 .  fremanezumab -vfrm (Ajovy  Autoinjector) 225 mg/1.5 mL atIn, Inject 225 mg under the skin every 30 (thirty) days., Disp: , Rfl:  .  gabapentin  (NEURONTIN ) 300 mg capsule, Take 2 caps by mouth three times daily for next 10 days, Disp: 120 capsule, Rfl: 0 .  lamoTRIgine  (LaMICtal ) 200 mg tablet, TAKE 1 TABLET (200 MG TOTAL) BY MOUTH 2 (TWO) TIMES DAILY., Disp: , Rfl: 11 .  lamoTRIgine  (LaMICtal ) 25 mg tablet, Take 50 mg by mouth 2 (two) times a day., Disp: , Rfl:  .  methylPREDNISolone  (MEDROL  DOSEPAK) 4 mg 6 day dose pack, Take As Directed On Package, Disp: 21 tablet, Rfl: 0 .   ondansetron  (ZOFRAN -ODT) 4 mg disintegrating tablet, TAKE 1 TABLET (4 MG TOTAL) BY MOUTH EVERY 8 (EIGHT) HOURS AS NEEDED FOR NAUSEA., Disp: , Rfl: 11 .  rimegepant (Nurtec ODT) 75 mg TbDL, Take 75 mg by mouth  as needed (migraine)., Disp: , Rfl:  .  rizatriptan  (MAXALT ) 10 mg tablet, TAKE 1 TABLET BY MOUTH AS NEEDED FOR MIGRAINE. MAY REPEAT IN 2 HOURS IF NEEDE, Disp: , Rfl: 11 .  traZODone (DESYREL) 100 mg tablet, Take 100 mg by mouth as needed. (Patient not taking: Reported on 01/08/2024), Disp: , Rfl:

## 2024-03-18 ENCOUNTER — Encounter

## 2024-03-18 ENCOUNTER — Other Ambulatory Visit: Payer: Self-pay

## 2024-03-18 DIAGNOSIS — Z006 Encounter for examination for normal comparison and control in clinical research program: Secondary | ICD-10-CM

## 2024-03-18 MED ORDER — STUDY - MK-8591A-052 - BICTEGRAVIR/EMTRICITABINE/TENOFOVIR ALAFENAMIDE (BIKTARVY) 50-200-25 MG OR PLACEBO TABLET (PI-VAN DAM): 1.0000 | ORAL_TABLET | Freq: Every day | ORAL | Status: DC

## 2024-03-18 MED ORDER — STUDY - MK-8591A-052 - BICTEGRAVIR/EMTRICITABINE/TENOFOVIR ALAFENAMIDE (BIKTARVY) 50-200-25 MG OR PLACEBO TABLET (PI-VAN DAM): 1.0000 | ORAL_TABLET | Freq: Every day | ORAL | 0 refills | Status: DC

## 2024-03-18 MED ORDER — STUDY - MK-8591A-052 - MK-8591A 100/0.25 MG OR PLACEBO TABLET (PI-VAN DAM): 1.0000 | ORAL_TABLET | Freq: Every day | ORAL | 0 refills | Status: DC

## 2024-03-18 NOTE — Research (Signed)
 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   Overall individual is doing well. All procedures carried out per protocol. Study medications dispensed. Discussed elevated BP at visit, asymptomatic.

## 2024-06-10 ENCOUNTER — Encounter

## 2024-06-10 ENCOUNTER — Other Ambulatory Visit: Payer: Self-pay

## 2024-06-10 DIAGNOSIS — Z006 Encounter for examination for normal comparison and control in clinical research program: Secondary | ICD-10-CM

## 2024-06-10 MED ORDER — STUDY - MK-8591A-052 - MK-8591A 100/0.25 MG OR PLACEBO TABLET (PI-VAN DAM): 1.0000 | ORAL_TABLET | Freq: Every day | ORAL | 0 refills | Status: AC

## 2024-06-10 MED ORDER — STUDY - MK-8591A-052 - BICTEGRAVIR/EMTRICITABINE/TENOFOVIR ALAFENAMIDE (BIKTARVY) 50-200-25 MG OR PLACEBO TABLET (PI-VAN DAM): 1.0000 | ORAL_TABLET | Freq: Every day | ORAL | 0 refills | Status: AC

## 2024-06-10 NOTE — Research (Signed)
 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   Updated Inform Consent required, explained/reviewed, risk, benefits, responsibilities and other options were reviewed. Questions answered, comprehension of the study was assessed and adequate time to consider options was provided. Individual verbalized understanding and signed the informed consent witnessed by study coordinator. Copy provided to participant. Signed consent was obtained prior to any study procedures being completed. Overall individual is doing well. All procedures carried out per protocol. Study medications dispensed. Plan to see participant again in 12 weeks.

## 2024-07-04 ENCOUNTER — Other Ambulatory Visit: Payer: Self-pay

## 2024-07-04 DIAGNOSIS — Z006 Encounter for examination for normal comparison and control in clinical research program: Secondary | ICD-10-CM

## 2024-07-05 NOTE — Research (Signed)
 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   All procedures carried out per protocol. Plan to see participant again in Apr 2026.
